# Patient Record
Sex: Female | Born: 1990 | Race: Black or African American | Hispanic: No | Marital: Married | Smoking: Never smoker
Health system: Southern US, Community
[De-identification: ages and names within clinical notes are randomized; demographics above are authoritative.]

## PROBLEM LIST (undated history)

## (undated) ENCOUNTER — Ambulatory Visit: Disposition: A | Discharge status: Home / Self Care | Payer: BC Managed Care – PPO

## (undated) ENCOUNTER — Inpatient Hospital Stay (HOSPITAL_COMMUNITY): Payer: Self-pay

## (undated) DIAGNOSIS — J4520 Mild intermittent asthma, uncomplicated: Secondary | ICD-10-CM

## (undated) DIAGNOSIS — Z87410 Personal history of cervical dysplasia: Secondary | ICD-10-CM

## (undated) DIAGNOSIS — K589 Irritable bowel syndrome without diarrhea: Secondary | ICD-10-CM

## (undated) DIAGNOSIS — Z8742 Personal history of other diseases of the female genital tract: Secondary | ICD-10-CM

## (undated) DIAGNOSIS — G930 Cerebral cysts: Secondary | ICD-10-CM

## (undated) DIAGNOSIS — R569 Unspecified convulsions: Secondary | ICD-10-CM

## (undated) DIAGNOSIS — J309 Allergic rhinitis, unspecified: Secondary | ICD-10-CM

## (undated) DIAGNOSIS — J310 Chronic rhinitis: Secondary | ICD-10-CM

## (undated) DIAGNOSIS — Q046 Congenital cerebral cysts: Secondary | ICD-10-CM

## (undated) DIAGNOSIS — K581 Irritable bowel syndrome with constipation: Secondary | ICD-10-CM

## (undated) DIAGNOSIS — N83209 Unspecified ovarian cyst, unspecified side: Secondary | ICD-10-CM

## (undated) DIAGNOSIS — K219 Gastro-esophageal reflux disease without esophagitis: Secondary | ICD-10-CM

## (undated) DIAGNOSIS — R87619 Unspecified abnormal cytological findings in specimens from cervix uteri: Secondary | ICD-10-CM

## (undated) DIAGNOSIS — R51 Headache: Secondary | ICD-10-CM

## (undated) DIAGNOSIS — G43909 Migraine, unspecified, not intractable, without status migrainosus: Secondary | ICD-10-CM

## (undated) HISTORY — DX: Unspecified abnormal cytological findings in specimens from cervix uteri: R87.619

## (undated) HISTORY — DX: Unspecified convulsions: R56.9

## (undated) HISTORY — DX: Headache: R51

## (undated) HISTORY — DX: Allergic rhinitis, unspecified: J30.9

## (undated) HISTORY — DX: Cerebral cysts: G93.0

## (undated) HISTORY — DX: Mild intermittent asthma, uncomplicated: J45.20

---
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
## 2009-03-16 ENCOUNTER — Other Ambulatory Visit: Admission: RE | Admit: 2009-03-16 | Discharge: 2009-03-16 | Payer: Self-pay | Admitting: Family Medicine

## 2009-05-25 ENCOUNTER — Emergency Department (HOSPITAL_COMMUNITY): Admission: EM | Admit: 2009-05-25 | Discharge: 2009-05-25 | Payer: Self-pay | Admitting: Emergency Medicine

## 2011-01-02 LAB — STREP A DNA PROBE: Group A Strep Probe: NEGATIVE

## 2011-01-02 LAB — MONONUCLEOSIS SCREEN: Mono Screen: NEGATIVE

## 2011-04-02 ENCOUNTER — Encounter (HOSPITAL_COMMUNITY): Payer: Self-pay | Admitting: *Deleted

## 2011-04-02 ENCOUNTER — Emergency Department (HOSPITAL_COMMUNITY)
Admission: EM | Admit: 2011-04-02 | Discharge: 2011-04-02 | Disposition: A | Discharge status: Home / Self Care | Payer: BC Managed Care – PPO | Source: Home / Self Care | Attending: Family Medicine | Admitting: Family Medicine

## 2011-04-02 DIAGNOSIS — J069 Acute upper respiratory infection, unspecified: Secondary | ICD-10-CM

## 2011-04-02 MED ORDER — IBUPROFEN 600 MG PO TABS: 600.0000 mg | ORAL_TABLET | Freq: Three times a day (TID) | ORAL | Status: AC | PRN

## 2011-04-02 MED ORDER — BENZONATATE 100 MG PO CAPS: 100.0000 mg | ORAL_CAPSULE | Freq: Three times a day (TID) | ORAL | Status: AC

## 2011-04-02 MED ORDER — ALBUTEROL SULFATE HFA 108 (90 BASE) MCG/ACT IN AERS: 2.0000 | INHALATION_SPRAY | Freq: Four times a day (QID) | RESPIRATORY_TRACT | Status: DC | PRN

## 2011-04-02 MED ORDER — HYDROCODONE-ACETAMINOPHEN 7.5-500 MG/15ML PO SOLN: 15.0000 mL | Freq: Three times a day (TID) | ORAL | Status: AC | PRN

## 2011-04-02 NOTE — ED Notes (Signed)
Co wheezing with chest tightness and fever with ha x 2 days, prednisone and zpak given at school on Friday, feels like she is not improving

## 2011-04-04 NOTE — ED Provider Notes (Signed)
History     CSN: [id number]  Arrival date & time 04/02/11  1850   First MD Initiated Contact with Patient 04/02/11 1923      Chief Complaint  Patient presents with  . Asthma    (Consider location/radiation/quality/duration/timing/severity/associated sxs/prior treatment) HPI Comments: 21 y/o female h/o asthma here with mother concerned about persistent cough and fever. Was seen in school 2 days ago had a prescription for azithromycin and started prednisone state she is taking medications with no difficulties. No wheezing today has not had any medication for fever today, afebrile here. Mother concerned as patient still coughing and not eating much. Patient reports feeling better and tolerating fluids well with no vomiting or diarrhea. Needs albuterol refill.   Past Medical History  Diagnosis Date  . Asthma     History reviewed. No pertinent past surgical history.  History reviewed. No pertinent family history.  History  Substance Use Topics  . Smoking status: Never Smoker   . Smokeless tobacco: Not on file  . Alcohol Use: Yes    OB History    Grav Para Term Preterm Abortions TAB SAB Ect Mult Living                  Review of Systems  Constitutional: Positive for fever and appetite change.  HENT: Positive for congestion and rhinorrhea. Negative for sore throat.   Respiratory: Negative for shortness of breath and wheezing.   Musculoskeletal: Positive for myalgias and arthralgias.    Allergies  Review of patient's allergies indicates no known allergies.  Home Medications   Current Outpatient Rx  Name Route Sig Dispense Refill  . ACETAMINOPHEN 325 MG PO TABS Oral Take 650 mg by mouth every 6 (six) hours as needed.    . AZITHROMYCIN 1 G PO PACK Oral Take 1 packet by mouth once.    Marland Kitchen PREDNISONE 20 MG PO TABS Oral Take 20 mg by mouth 2 (two) times daily.    . ALBUTEROL SULFATE HFA 108 (90 BASE) MCG/ACT IN AERS Inhalation Inhale 2 puffs into the lungs every 6 (six)  hours as needed for wheezing or shortness of breath. 1 Inhaler 0  . BENZONATATE 100 MG PO CAPS Oral Take 1 capsule (100 mg total) by mouth every 8 (eight) hours. 21 capsule 0  . HYDROCODONE-ACETAMINOPHEN 7.5-500 MG/15ML PO SOLN Oral Take 15 mLs by mouth every 8 (eight) hours as needed for pain or cough. 120 mL 0  . IBUPROFEN 600 MG PO TABS Oral Take 1 tablet (600 mg total) by mouth every 8 (eight) hours as needed for pain or fever. 20 tablet 0    BP 110/69  Pulse 64  Temp(Src) 97.9 F (36.6 C) (Oral)  Resp 17  SpO2 96%  LMP 03/19/2011  Physical Exam  Nursing note and vitals reviewed. Constitutional: She is oriented to person, place, and time. She appears well-developed and well-nourished.  HENT:  Head: Normocephalic and atraumatic.  Right Ear: External ear normal.  Left Ear: External ear normal.       Nasal Congestion with erythema and swelling of nasal turbinates, clear rhinorrhea. Mild pharyngeal erythema no exudates.  TM's normal.  Eyes: Conjunctivae and EOM are normal. Pupils are equal, round, and reactive to light. Right eye exhibits discharge. Left eye exhibits no discharge.  Neck: Normal range of motion. Neck supple.  Cardiovascular: Normal rate, regular rhythm and normal heart sounds.   Pulmonary/Chest: Effort normal and breath sounds normal. No respiratory distress. She has no wheezes. She has no  rales. She exhibits no tenderness.  Lymphadenopathy:    She has no cervical adenopathy.  Neurological: She is alert and oriented to person, place, and time.  Skin: No rash noted.    ED Course  Procedures (including critical care time)  Labs Reviewed - No data to display No results found.   1. URI (upper respiratory infection)       MDM  Likely resolving influenza causing asthma exacerbation. Asked to complete prednisone. Symptomatic cough treatment. Return if recurrent fever or worsening symptoms.         Sharin Grave, MD 04/04/11 1217

## 2011-05-09 DIAGNOSIS — J309 Allergic rhinitis, unspecified: Secondary | ICD-10-CM | POA: Insufficient documentation

## 2012-02-13 ENCOUNTER — Encounter (HOSPITAL_COMMUNITY): Payer: Self-pay | Admitting: *Deleted

## 2012-02-13 ENCOUNTER — Emergency Department (HOSPITAL_COMMUNITY)
Admission: EM | Admit: 2012-02-13 | Discharge: 2012-02-14 | Disposition: A | Discharge status: Home / Self Care | Payer: BC Managed Care – PPO | Attending: Emergency Medicine | Admitting: Emergency Medicine

## 2012-02-13 DIAGNOSIS — O9989 Other specified diseases and conditions complicating pregnancy, childbirth and the puerperium: Secondary | ICD-10-CM | POA: Insufficient documentation

## 2012-02-13 DIAGNOSIS — J45909 Unspecified asthma, uncomplicated: Secondary | ICD-10-CM | POA: Insufficient documentation

## 2012-02-13 DIAGNOSIS — N730 Acute parametritis and pelvic cellulitis: Secondary | ICD-10-CM

## 2012-02-13 DIAGNOSIS — Z79899 Other long term (current) drug therapy: Secondary | ICD-10-CM | POA: Insufficient documentation

## 2012-02-13 DIAGNOSIS — N390 Urinary tract infection, site not specified: Secondary | ICD-10-CM | POA: Insufficient documentation

## 2012-02-13 DIAGNOSIS — N739 Female pelvic inflammatory disease, unspecified: Secondary | ICD-10-CM | POA: Insufficient documentation

## 2012-02-13 DIAGNOSIS — N898 Other specified noninflammatory disorders of vagina: Secondary | ICD-10-CM | POA: Insufficient documentation

## 2012-02-13 LAB — CBC WITH DIFFERENTIAL/PLATELET
Basophils Absolute: 0 10*3/uL (ref 0.0–0.1)
Eosinophils Absolute: 0.1 10*3/uL (ref 0.0–0.7)
HCT: 35.7 % — ABNORMAL LOW (ref 36.0–46.0)
Hemoglobin: 12 g/dL (ref 12.0–15.0)
MCH: 30.1 pg (ref 26.0–34.0)
MCHC: 33.6 g/dL (ref 30.0–36.0)
MCV: 89.5 fL (ref 78.0–100.0)
Monocytes Absolute: 0.3 10*3/uL (ref 0.1–1.0)
RBC: 3.99 MIL/uL (ref 3.87–5.11)
WBC: 4.7 10*3/uL (ref 4.0–10.5)

## 2012-02-13 LAB — COMPREHENSIVE METABOLIC PANEL
ALT: 22 U/L (ref 0–35)
AST: 24 U/L (ref 0–37)
Albumin: 3.6 g/dL (ref 3.5–5.2)
Alkaline Phosphatase: 61 U/L (ref 39–117)
BUN: 8 mg/dL (ref 6–23)
CO2: 24 mEq/L (ref 19–32)
Calcium: 9.1 mg/dL (ref 8.4–10.5)
Chloride: 99 mEq/L (ref 96–112)
Creatinine, Ser: 0.79 mg/dL (ref 0.50–1.10)
GFR calc Af Amer: >90 mL/min (ref >90)
GFR calc non Af Amer: >90 mL/min (ref >90)
Glucose, Bld: 113 mg/dL — ABNORMAL HIGH (ref 70–99)
Potassium: 3 mEq/L — ABNORMAL LOW (ref 3.5–5.1)
Sodium: 133 mEq/L — ABNORMAL LOW (ref 135–145)
Total Bilirubin: 0.5 mg/dL (ref 0.3–1.2)
Total Protein: 7 g/dL (ref 6.0–8.3)

## 2012-02-13 LAB — WET PREP, GENITAL
Clue Cells Wet Prep HPF POC: NONE SEEN
Trich, Wet Prep: NONE SEEN
Yeast Wet Prep HPF POC: NONE SEEN

## 2012-02-13 LAB — URINALYSIS, ROUTINE W REFLEX MICROSCOPIC
Bilirubin Urine: NEGATIVE
Glucose, UA: NEGATIVE mg/dL
Hgb urine dipstick: NEGATIVE
Ketones, ur: 40 mg/dL — AB
Nitrite: NEGATIVE
Protein, ur: 30 mg/dL — AB
Specific Gravity, Urine: 1.036 — ABNORMAL HIGH (ref 1.005–1.030)
Urobilinogen, UA: 1 mg/dL (ref 0.0–1.0)
pH: 7 (ref 5.0–8.0)

## 2012-02-13 LAB — URINE MICROSCOPIC-ADD ON

## 2012-02-13 MED ORDER — AZITHROMYCIN 500 MG PO TABS: 1000.0000 mg | ORAL_TABLET | Freq: Once | ORAL | Status: DC

## 2012-02-13 MED ORDER — CEPHALEXIN 250 MG PO CAPS
500.0000 mg | ORAL_CAPSULE | Freq: Once | ORAL | Status: AC
  Administered 2012-02-14: 500 mg via ORAL
  Filled 2012-02-13: qty 2

## 2012-02-13 MED ORDER — AZITHROMYCIN 1 G PO PACK
1.0000 g | PACK | Freq: Once | ORAL | Status: AC
  Administered 2012-02-14: 1 g via ORAL
  Filled 2012-02-13 (×2): qty 1

## 2012-02-13 MED ORDER — MORPHINE SULFATE 4 MG/ML IJ SOLN
4.0000 mg | Freq: Once | INTRAMUSCULAR | Status: AC
  Administered 2012-02-13: 4 mg via INTRAVENOUS
  Filled 2012-02-13: qty 1

## 2012-02-13 MED ORDER — CEPHALEXIN 500 MG PO CAPS: 500.0000 mg | ORAL_CAPSULE | Freq: Two times a day (BID) | ORAL | Status: AC

## 2012-02-13 MED ORDER — CEFTRIAXONE SODIUM 250 MG IJ SOLR
250.0000 mg | Freq: Once | INTRAMUSCULAR | Status: AC
  Administered 2012-02-14: 250 mg via INTRAMUSCULAR
  Filled 2012-02-13: qty 250

## 2012-02-13 MED ORDER — SODIUM CHLORIDE 0.9 % IV BOLUS (SEPSIS)
1000.0000 mL | Freq: Once | INTRAVENOUS | Status: AC
  Administered 2012-02-13: 1000 mL via INTRAVENOUS

## 2012-02-13 MED ORDER — LIDOCAINE HCL (PF) 1 % IJ SOLN
INTRAMUSCULAR | Status: AC
  Administered 2012-02-14: 1 mL
  Filled 2012-02-13: qty 5

## 2012-02-13 MED ORDER — ONDANSETRON HCL 4 MG/2ML IJ SOLN
4.0000 mg | Freq: Once | INTRAMUSCULAR | Status: AC
  Administered 2012-02-13: 4 mg via INTRAVENOUS
  Filled 2012-02-13: qty 2

## 2012-02-13 NOTE — ED Notes (Signed)
Pt c/o upper abd burning sensation when she sits up along with lower abd pain, worse of left side/ovary X 2.5 weeks. Pt reports she has a cyst on her left ovary. Pain has gotten worse today. Pt reports she felt like she peed on herself today but it was just some clear/yellow vaginal d/c, pt reports small amt, maybe would fill a light pad. Pt reports she is [redacted] weeks pregnant and has gone to see her ob/gyn once.

## 2012-02-13 NOTE — ED Notes (Signed)
The pt has had lower abd pain for one week.  Nauseated but she is 8 weeks preg.  lmp oct 15.  No vaginal bleeding but she has had a vaginal discharge.

## 2012-02-13 NOTE — ED Provider Notes (Signed)
History     CSN: [id number]  Arrival date & time 02/13/12  2118   First MD Initiated Contact with Patient 02/13/12 2140      Chief Complaint  Patient presents with  . Abdominal Pain    (Consider location/radiation/quality/duration/timing/severity/associated sxs/prior treatment) Patient is a 21 y.o. female presenting with abdominal pain. The history is provided by the patient.  Abdominal Pain The primary symptoms of the illness include abdominal pain and vaginal discharge. The primary symptoms of the illness do not include fever, shortness of breath, nausea, vomiting or diarrhea. The current episode started 13 to 24 hours ago. The onset of the illness was gradual. The problem has been gradually worsening.  The abdominal pain began 13 to24 hours ago. The pain came on gradually. The abdominal pain has been gradually worsening since its onset. The abdominal pain is located in the periumbilical region, suprapubic region, RLQ and LLQ. The abdominal pain does not radiate. The abdominal pain is relieved by nothing.  Symptoms associated with the illness do not include chills, diaphoresis, urgency, hematuria, frequency or back pain. Associated medical issues comments: Pregnancy.    Past Medical History  Diagnosis Date  . Asthma     History reviewed. No pertinent past surgical history.  No family history on file.  History  Substance Use Topics  . Smoking status: Never Smoker   . Smokeless tobacco: Not on file  . Alcohol Use: Yes    OB History    Grav Para Term Preterm Abortions TAB SAB Ect Mult Living   1               Review of Systems  Constitutional: Negative for fever, chills and diaphoresis.  Respiratory: Negative for cough and shortness of breath.   Gastrointestinal: Positive for abdominal pain. Negative for nausea, vomiting and diarrhea.  Genitourinary: Positive for vaginal discharge. Negative for urgency, frequency and hematuria.  Musculoskeletal: Negative for back  pain.  All other systems reviewed and are negative.    Allergies  Review of patient's allergies indicates no known allergies.  Home Medications   Current Outpatient Rx  Name  Route  Sig  Dispense  Refill  . ALBUTEROL SULFATE HFA 108 (90 BASE) MCG/ACT IN AERS   Inhalation   Inhale 2 puffs into the lungs every 6 (six) hours as needed for wheezing or shortness of breath.   1 Inhaler   0   . ONDANSETRON HCL 4 MG PO TABS   Oral   Take 4 mg by mouth every 8 (eight) hours as needed. For nausea or vomiting           BP 113/55  Pulse 86  Temp 97.8 F (36.6 C) (Oral)  Resp 18  SpO2 100%  LMP 03/19/2011  Physical Exam  Nursing note and vitals reviewed. Constitutional: She is oriented to person, place, and time. She appears well-developed and well-nourished. No distress.  HENT:  Head: Normocephalic and atraumatic.  Eyes: EOM are normal. Pupils are equal, round, and reactive to light.  Neck: Normal range of motion. Neck supple.  Cardiovascular: Normal rate and regular rhythm.  Exam reveals no friction rub.   No murmur heard. Pulmonary/Chest: Effort normal and breath sounds normal. No respiratory distress. She has no wheezes. She has no rales.  Abdominal: Soft. She exhibits no distension. There is tenderness (lower abdomen). There is no rebound.  Genitourinary: There is no rash, tenderness or lesion on the right labia. There is no rash, tenderness or lesion on the left  labia. Cervix exhibits motion tenderness (Mild). Cervix exhibits no friability. Right adnexum displays tenderness. Right adnexum displays no mass and no fullness. Left adnexum displays tenderness. Left adnexum displays no mass and no fullness. There is erythema (Mild cervical) and tenderness (Diffuse.) around the vagina. No bleeding around the vagina. No foreign body around the vagina. No signs of injury around the vagina. Vaginal discharge found.  Musculoskeletal: Normal range of motion. She exhibits no edema.    Neurological: She is alert and oriented to person, place, and time.  Skin: She is not diaphoretic.    ED Course  Procedures (including critical care time)  Labs Reviewed  CBC WITH DIFFERENTIAL - Abnormal; Notable for the following:    HCT 35.7 (*)     RDW 11.3 (*)     All other components within normal limits  COMPREHENSIVE METABOLIC PANEL - Abnormal; Notable for the following:    Sodium 133 (*)     Potassium 3.0 (*)     Glucose, Bld 113 (*)     All other components within normal limits  URINALYSIS, ROUTINE W REFLEX MICROSCOPIC - Abnormal; Notable for the following:    APPearance CLOUDY (*)     Specific Gravity, Urine 1.036 (*)     Ketones, ur 40 (*)     Protein, ur 30 (*)     Leukocytes, UA MODERATE (*)     All other components within normal limits  PREGNANCY, URINE - Abnormal; Notable for the following:    Preg Test, Ur POSITIVE (*)     All other components within normal limits  URINE MICROSCOPIC-ADD ON - Abnormal; Notable for the following:    Squamous Epithelial / LPF FEW (*)     Bacteria, UA FEW (*)     All other components within normal limits  URINE CULTURE   No results found.   1. PID (acute pelvic inflammatory disease)   2. UTI (lower urinary tract infection)       MDM   79F with hx of 6 week pregnancy presents with abdominal pain. Also states some vaginal discharge. Had prenatal care last week with a transvaginal ultrasound confirming IUP per her primary care doctor's office. I spoke with Dr. Rubin Payor  with her primary care doctor's office who gave the ultrasound report directly to the patient who then told me. Here vitals are stable. Mild lower diffuse abdominal pain. On pelvic exam, patient has purulent discharge in vagina. She has mild cervical erythema. The os is closed. No vaginal bleeding. Clinical exam consistent with PID. I spoke with Dr. Debroah Loop with gynecology who recommended Rocephin with 1 g of azithromycin now and a prescription to repeat the 1 g  of azithromycin in one week. Labs also show UTI, given Keflex first dose here with prescription for same. Instructed to followup with her doctor who provides prenatal care within the next week. Stable for discharge.     Elwin Mocha, MD 02/13/12 [phone]

## 2012-02-14 LAB — GC/CHLAMYDIA PROBE AMP: CT Probe RNA: NEGATIVE

## 2012-02-14 NOTE — ED Notes (Signed)
Pt had more questions and EDP in to see pt.

## 2012-02-14 NOTE — ED Provider Notes (Signed)
I saw and evaluated the patient, reviewed Dr. Tyson Alias note and I agree with the findings and plan.  The patient presents with lower abd pain.  She is pregnant and had an ultrasound performed by her pcp in the past few days.  She reports vaginal discharge but no bleeding or fever.  There are no urinary complaints.    On exam, the patient is afebrile and the vitals are stable.  There heart and lung exam is unremarkable.  The abdomen is ttp in the suprapubic region.  Pelvic exam was performed per Dr. Gwendolyn Grant.  Labs were obtained and revealed evidence for uti and possibly pid.  She will be treated with zithromax and rocephin.  She is non-toxic and is tolerating po.  She appears appropriate for discharge.  She is to follow up with OB and return prn.   Geoffery Lyons, MD 02/14/12 [phone]

## 2012-02-14 NOTE — ED Notes (Signed)
No s/s of adverse reaction from IM abx. Pt instructed on s/s of adverse reaction and need to return to ED if sx occur

## 2012-02-15 LAB — URINE CULTURE

## 2012-02-29 ENCOUNTER — Emergency Department (HOSPITAL_COMMUNITY)
Admission: EM | Admit: 2012-02-29 | Discharge: 2012-03-01 | Disposition: A | Discharge status: Home / Self Care | Payer: BC Managed Care – PPO | Attending: Emergency Medicine | Admitting: Emergency Medicine

## 2012-02-29 ENCOUNTER — Encounter (HOSPITAL_COMMUNITY): Payer: Self-pay | Admitting: Adult Health

## 2012-02-29 DIAGNOSIS — R109 Unspecified abdominal pain: Secondary | ICD-10-CM

## 2012-02-29 DIAGNOSIS — O26899 Other specified pregnancy related conditions, unspecified trimester: Secondary | ICD-10-CM

## 2012-02-29 DIAGNOSIS — O9989 Other specified diseases and conditions complicating pregnancy, childbirth and the puerperium: Secondary | ICD-10-CM | POA: Insufficient documentation

## 2012-02-29 DIAGNOSIS — O219 Vomiting of pregnancy, unspecified: Secondary | ICD-10-CM

## 2012-02-29 DIAGNOSIS — R1084 Generalized abdominal pain: Secondary | ICD-10-CM | POA: Insufficient documentation

## 2012-02-29 DIAGNOSIS — J45909 Unspecified asthma, uncomplicated: Secondary | ICD-10-CM | POA: Insufficient documentation

## 2012-02-29 DIAGNOSIS — K5900 Constipation, unspecified: Secondary | ICD-10-CM | POA: Insufficient documentation

## 2012-02-29 DIAGNOSIS — E860 Dehydration: Secondary | ICD-10-CM

## 2012-02-29 DIAGNOSIS — Z79899 Other long term (current) drug therapy: Secondary | ICD-10-CM | POA: Insufficient documentation

## 2012-02-29 NOTE — ED Notes (Signed)
2 month pregnant presenting with constipation, lower abdominal sharp pains, nausea and inability to eat for 3 weeks, she also reports small amount dark red spotting.

## 2012-03-01 LAB — URINALYSIS, ROUTINE W REFLEX MICROSCOPIC
Glucose, UA: NEGATIVE mg/dL
Hgb urine dipstick: NEGATIVE
Leukocytes, UA: NEGATIVE
pH: 6.5 (ref 5.0–8.0)

## 2012-03-01 LAB — BASIC METABOLIC PANEL
Chloride: 100 mEq/L (ref 96–112)
GFR calc Af Amer: >90 mL/min (ref >90)
GFR calc non Af Amer: >90 mL/min (ref >90)
Potassium: 3.3 mEq/L — ABNORMAL LOW (ref 3.5–5.1)
Sodium: 135 mEq/L (ref 135–145)

## 2012-03-01 LAB — WET PREP, GENITAL
Trich, Wet Prep: NONE SEEN
Yeast Wet Prep HPF POC: NONE SEEN

## 2012-03-01 LAB — CBC
Hemoglobin: 12.6 g/dL (ref 12.0–15.0)
MCHC: 35.1 g/dL (ref 30.0–36.0)
RBC: 4.03 MIL/uL (ref 3.87–5.11)
WBC: 5.7 10*3/uL (ref 4.0–10.5)

## 2012-03-01 LAB — ABO/RH: ABO/RH(D): O POS

## 2012-03-01 MED ORDER — ONDANSETRON 4 MG PO TBDP: 4.0000 mg | ORAL_TABLET | Freq: Three times a day (TID) | ORAL | Status: DC | PRN

## 2012-03-01 MED ORDER — POLYETHYLENE GLYCOL 3350 17 G PO PACK: 17.0000 g | PACK | Freq: Every day | ORAL | Status: DC

## 2012-03-01 MED ORDER — ONDANSETRON 4 MG PO TBDP
8.0000 mg | ORAL_TABLET | Freq: Once | ORAL | Status: AC
  Administered 2012-03-01: 8 mg via ORAL
  Filled 2012-03-01: qty 2

## 2012-03-01 NOTE — ED Provider Notes (Signed)
History     CSN: [id number]  Arrival date & time 02/29/12  2323   First MD Initiated Contact with Patient 02/29/12 2330      Chief Complaint  Patient presents with  . Abdominal Pain    (Consider location/radiation/quality/duration/timing/severity/associated sxs/prior treatment) HPI 21 year old female presents to emergency department complaining of diffuse crampy sharp abdominal pain, nausea and inability to a recurrent well, constipation for the last 3 days. Today she had a small amount of dark red spotting when she wiped after urination. She denies any urinary symptoms, no fevers no chills. Patient is approximately [redacted] weeks pregnant. She does not know her blood type. She has had an ultrasound for her OB that showed an intrauterine pregnancy. Her OB is in [place], and she is in the process of changing it to a local clinic. Patient was seen in the ER recently and thought to have cervicitis/PID. She was treated, and told to follow up with OB which she has not yet done. She has not had a flu shot this year. Patient's mother reports that she has an ongoing problem with constipation, at one point they suggested surgery to correct it. She is requesting an ultrasound so that she can make sure that she is healthy. She reports she has a known ovarian cyst on the left, which "they're not doing anything about". Patient has followup with her OB on the 19th.  Past Medical History  Diagnosis Date  . Asthma     History reviewed. No pertinent past surgical history.  History reviewed. No pertinent family history.  History  Substance Use Topics  . Smoking status: Never Smoker   . Smokeless tobacco: Not on file  . Alcohol Use: Yes    OB History    Grav Para Term Preterm Abortions TAB SAB Ect Mult Living   1               Review of Systems  See History of Present Illness; otherwise all other systems are reviewed and negative  Allergies  Review of patient's allergies indicates no known  allergies.  Home Medications   Current Outpatient Rx  Name  Route  Sig  Dispense  Refill  . ALBUTEROL SULFATE HFA 108 (90 BASE) MCG/ACT IN AERS   Inhalation   Inhale 2 puffs into the lungs every 6 (six) hours as needed for wheezing or shortness of breath.   1 Inhaler   0   . PRENATAL MULTIVITAMIN CH   Oral   Take 1 tablet by mouth daily.         Marland Kitchen ONDANSETRON 4 MG PO TBDP   Oral   Take 1 tablet (4 mg total) by mouth every 8 (eight) hours as needed for nausea (You may take 1-2 tab every 8 hours for nausea).   20 tablet   0   . POLYETHYLENE GLYCOL 3350 PO PACK   Oral   Take 17 g by mouth daily.   14 each   0     BP 107/55  Pulse 62  Temp 97.1 F (36.2 C) (Oral)  Resp 16  SpO2 99%  LMP 03/19/2011  Physical Exam  Nursing note and vitals reviewed. Constitutional: She is oriented to person, place, and time. She appears well-developed and well-nourished. No distress.  HENT:  Head: Normocephalic and atraumatic.  Nose: Nose normal.  Mouth/Throat: Oropharynx is clear and moist.  Eyes: Conjunctivae normal and EOM are normal. Pupils are equal, round, and reactive to light.  Neck: Normal range of  motion. Neck supple. No JVD present. No tracheal deviation present. No thyromegaly present.  Cardiovascular: Normal rate, regular rhythm, normal heart sounds and intact distal pulses.  Exam reveals no gallop and no friction rub.   No murmur heard. Pulmonary/Chest: Effort normal and breath sounds normal. No stridor. No respiratory distress. She has no wheezes. She has no rales. She exhibits no tenderness.  Abdominal: Soft. Bowel sounds are normal. She exhibits no distension and no mass. There is tenderness (patient with diffuse tenderness throughout her abdomen). There is no rebound and no guarding.  Genitourinary:       External genitalia normal Vagina without discharge Cervix closed no lesions No cervical motion tenderness Adnexa palpated, no masses or tenderness noted on the  right, tenderness on left Bladder palpated no tenderness Uterus palpated gravid, mild tenderness    Musculoskeletal: Normal range of motion. She exhibits no edema and no tenderness.  Lymphadenopathy:    She has no cervical adenopathy.  Neurological: She is alert and oriented to person, place, and time. No cranial nerve deficit. She exhibits normal muscle tone. Coordination normal.  Skin: Skin is warm and dry. No rash noted. No erythema. No pallor.  Psychiatric: She has a normal mood and affect. Her behavior is normal. Judgment and thought content normal.    ED Course  Procedures (including critical care time)  Labs Reviewed  URINALYSIS, ROUTINE W REFLEX MICROSCOPIC - Abnormal; Notable for the following:    Specific Gravity, Urine 1.033 (*)     Ketones, ur >80 (*)     All other components within normal limits  BASIC METABOLIC PANEL - Abnormal; Notable for the following:    Potassium 3.3 (*)     All other components within normal limits  CBC - Abnormal; Notable for the following:    HCT 35.9 (*)     RDW 11.3 (*)     All other components within normal limits  WET PREP, GENITAL - Abnormal; Notable for the following:    Clue Cells Wet Prep HPF POC MANY (*)     WBC, Wet Prep HPF POC RARE (*)     All other components within normal limits  ABO/RH  GC/CHLAMYDIA PROBE AMP   No results found.   1. Abdominal pain in pregnancy   2. Constipation   3. Nausea and vomiting in pregnancy   4. Dehydration       MDM  21 year old female with diffuse abdominal pain, and report of an episode of some dark blood when wiping. Bedside ultrasound briefly showed an intrauterine pregnancy with fetal heart rate in the 130s. I spent a good deal time counseling the patient on need for good OB followup, eating small frequent meals and taking anti-emetics as needed. She does not clinically appear dehydrated but does have ketones in her urine. She is tolerating fluids here. Her blood type is O. positive  and she does not require RhoGAM. She has been given precautions for return and encouraged to followup with her OB. Patient instructed to drink more fluids, add more fiber to her diet and use MiraLAX.        Olivia Mackie, MD 03/01/12 [phone]

## 2012-03-02 LAB — GC/CHLAMYDIA PROBE AMP: CT Probe RNA: NEGATIVE

## 2012-06-15 ENCOUNTER — Emergency Department (HOSPITAL_COMMUNITY): Payer: BC Managed Care – PPO

## 2012-06-15 ENCOUNTER — Encounter (HOSPITAL_COMMUNITY): Payer: Self-pay | Admitting: *Deleted

## 2012-06-15 ENCOUNTER — Emergency Department (HOSPITAL_COMMUNITY)
Admission: EM | Admit: 2012-06-15 | Discharge: 2012-06-15 | Disposition: A | Discharge status: Home / Self Care | Payer: BC Managed Care – PPO | Attending: Emergency Medicine | Admitting: Emergency Medicine

## 2012-06-15 DIAGNOSIS — Z79899 Other long term (current) drug therapy: Secondary | ICD-10-CM | POA: Insufficient documentation

## 2012-06-15 DIAGNOSIS — K819 Cholecystitis, unspecified: Secondary | ICD-10-CM

## 2012-06-15 DIAGNOSIS — Z3202 Encounter for pregnancy test, result negative: Secondary | ICD-10-CM | POA: Insufficient documentation

## 2012-06-15 DIAGNOSIS — R109 Unspecified abdominal pain: Secondary | ICD-10-CM | POA: Insufficient documentation

## 2012-06-15 DIAGNOSIS — R5383 Other fatigue: Secondary | ICD-10-CM | POA: Insufficient documentation

## 2012-06-15 DIAGNOSIS — R5381 Other malaise: Secondary | ICD-10-CM | POA: Insufficient documentation

## 2012-06-15 DIAGNOSIS — J45909 Unspecified asthma, uncomplicated: Secondary | ICD-10-CM | POA: Insufficient documentation

## 2012-06-15 DIAGNOSIS — R197 Diarrhea, unspecified: Secondary | ICD-10-CM | POA: Insufficient documentation

## 2012-06-15 DIAGNOSIS — R112 Nausea with vomiting, unspecified: Secondary | ICD-10-CM | POA: Insufficient documentation

## 2012-06-15 LAB — URINALYSIS, MICROSCOPIC ONLY
Nitrite: NEGATIVE
Specific Gravity, Urine: 1.018 (ref 1.005–1.030)
pH: 7.5 (ref 5.0–8.0)

## 2012-06-15 LAB — COMPREHENSIVE METABOLIC PANEL
Albumin: 3.8 g/dL (ref 3.5–5.2)
BUN: 8 mg/dL (ref 6–23)
Creatinine, Ser: 0.76 mg/dL (ref 0.50–1.10)
Potassium: 2.8 mEq/L — ABNORMAL LOW (ref 3.5–5.1)
Total Protein: 7.1 g/dL (ref 6.0–8.3)

## 2012-06-15 LAB — CBC WITH DIFFERENTIAL/PLATELET
Basophils Relative: 0 % (ref 0–1)
Eosinophils Absolute: 0.2 10*3/uL (ref 0.0–0.7)
Hemoglobin: 13.4 g/dL (ref 12.0–15.0)
MCH: 30.9 pg (ref 26.0–34.0)
MCHC: 35 g/dL (ref 30.0–36.0)
Monocytes Relative: 7 % (ref 3–12)
Neutrophils Relative %: 70 % (ref 43–77)
RDW: 12.4 % (ref 11.5–15.5)

## 2012-06-15 LAB — LIPASE, BLOOD: Lipase: 25 U/L (ref 11–59)

## 2012-06-15 MED ORDER — HYDROMORPHONE HCL PF 1 MG/ML IJ SOLN
1.0000 mg | Freq: Once | INTRAMUSCULAR | Status: AC
  Administered 2012-06-15: 1 mg via INTRAVENOUS
  Filled 2012-06-15: qty 1

## 2012-06-15 MED ORDER — PROMETHAZINE HCL 25 MG PO TABS: 25.0000 mg | ORAL_TABLET | Freq: Four times a day (QID) | ORAL | Status: DC | PRN

## 2012-06-15 MED ORDER — OXYCODONE-ACETAMINOPHEN 5-325 MG PO TABS: 2.0000 | ORAL_TABLET | ORAL | Status: DC | PRN

## 2012-06-15 MED ORDER — SODIUM CHLORIDE 0.9 % IV BOLUS (SEPSIS)
1000.0000 mL | Freq: Once | INTRAVENOUS | Status: AC
Start: 2012-06-15 — End: 2012-06-15
  Administered 2012-06-15: 1000 mL via INTRAVENOUS

## 2012-06-15 MED ORDER — SODIUM CHLORIDE 0.9 % IV BOLUS (SEPSIS)
1000.0000 mL | Freq: Once | INTRAVENOUS | Status: AC
  Administered 2012-06-15: 1000 mL via INTRAVENOUS

## 2012-06-15 MED ORDER — ONDANSETRON HCL 4 MG/2ML IJ SOLN
4.0000 mg | Freq: Once | INTRAMUSCULAR | Status: AC
  Administered 2012-06-15: 4 mg via INTRAVENOUS
  Filled 2012-06-15: qty 2

## 2012-06-15 NOTE — ED Provider Notes (Signed)
History     CSN: [id number]  Arrival date & time 06/15/12  1339   First MD Initiated Contact with Patient 06/15/12 1452      Chief Complaint  Patient presents with  . Abdominal Pain  . Emesis  . Diarrhea    (Consider location/radiation/quality/duration/timing/severity/associated sxs/prior treatment) HPI Comments: Patient comes to the ER for evaluation of nausea, vomiting and diarrhea. Symptoms began early this morning. Patient has had recurrent vomiting and has not been able to hold anything down. Patient reports severe upper abdominal pain, cramping. Discomfort is sharp in nature. It is associated with vomiting. Patient has reportedly been sick for 2 weeks for upper respiratory infection symptoms, but the GI symptoms began today.  Patient is a 22 y.o. female presenting with abdominal pain, vomiting, and diarrhea.  Abdominal Pain Associated symptoms: diarrhea and vomiting   Associated symptoms: no chest pain and no fever   Emesis Associated symptoms: abdominal pain and diarrhea   Diarrhea Associated symptoms: abdominal pain and vomiting   Associated symptoms: no fever     Past Medical History  Diagnosis Date  . Asthma     History reviewed. No pertinent past surgical history.  History reviewed. No pertinent family history.  History  Substance Use Topics  . Smoking status: Never Smoker   . Smokeless tobacco: Not on file  . Alcohol Use: Yes    OB History   Grav Para Term Preterm Abortions TAB SAB Ect Mult Living   1               Review of Systems  Constitutional: Negative for fever.  Cardiovascular: Negative for chest pain.  Gastrointestinal: Positive for vomiting, abdominal pain and diarrhea.  Neurological: Positive for weakness.  All other systems reviewed and are negative.    Allergies  Review of patient's allergies indicates no known allergies.  Home Medications   Current Outpatient Rx  Name  Route  Sig  Dispense  Refill  . PRESCRIPTION  MEDICATION   Oral   Take 1 tablet by mouth every 6 (six) hours as needed (pain).         Marland Kitchen albuterol (PROVENTIL HFA;VENTOLIN HFA) 108 (90 BASE) MCG/ACT inhaler   Inhalation   Inhale 2 puffs into the lungs every 6 (six) hours as needed for wheezing or shortness of breath.   1 Inhaler   0     BP 120/40  Pulse 81  Resp 22  SpO2 100%  LMP 06/15/2012  Breastfeeding? Unknown  Physical Exam  Constitutional: She is oriented to person, place, and time. She appears well-developed and well-nourished. She appears distressed.  HENT:  Head: Normocephalic and atraumatic.  Right Ear: Hearing normal.  Nose: Nose normal.  Mouth/Throat: Oropharynx is clear and moist and mucous membranes are normal.  Eyes: Conjunctivae and EOM are normal. Pupils are equal, round, and reactive to light.  Neck: Normal range of motion. Neck supple.  Cardiovascular: Normal rate, regular rhythm, S1 normal and S2 normal.  Exam reveals no gallop and no friction rub.   No murmur heard. Pulmonary/Chest: Effort normal and breath sounds normal. No respiratory distress. She exhibits no tenderness.  Abdominal: Soft. Normal appearance and bowel sounds are normal. There is no hepatosplenomegaly. There is tenderness in the right upper quadrant, epigastric area and left upper quadrant. There is no rebound, no guarding, no tenderness at McBurney's point and negative Murphy's sign. No hernia.  Epigastric and left upper quadrant more tender than right upper quadrant. Negative Murphy's sign.  Musculoskeletal: Normal  range of motion.  Neurological: She is alert and oriented to person, place, and time. She has normal strength. No cranial nerve deficit or sensory deficit. Coordination normal. GCS eye subscore is 4. GCS verbal subscore is 5. GCS motor subscore is 6.  Skin: Skin is warm, dry and intact. No rash noted. No cyanosis.  Psychiatric: She has a normal mood and affect. Her speech is normal and behavior is normal. Thought content  normal.    ED Course  Procedures (including critical care time)  Labs Reviewed  CBC WITH DIFFERENTIAL - Abnormal; Notable for the following:    WBC 10.7 (*)    All other components within normal limits  COMPREHENSIVE METABOLIC PANEL - Abnormal; Notable for the following:    Potassium 2.8 (*)    CO2 18 (*)    Glucose, Bld 150 (*)    AST 54 (*)    ALT 42 (*)    All other components within normal limits  GLUCOSE, CAPILLARY - Abnormal; Notable for the following:    Glucose-Capillary 129 (*)    All other components within normal limits  LIPASE, BLOOD  URINALYSIS, MICROSCOPIC ONLY   US Abdomen Complete  06/15/2012  *RADIOLOGY REPORT*  Clinical Data:  Upper abdominal pain.  COMPLETE ABDOMINAL ULTRASOUND  Comparison:  Two-view abdomen 06/19/2006.  Findings:  Gallbladder:  The gallbladder wall is thickened and appears somewhat edematous. It measures 5 mm.  There is no sonographic Murphy's sign.  No stones or obstruction are evident.  Common bile duct:  Normal in caliber. No biliary ductal dilation. The maximal diameter is at the upper limits of normal, 6 mm.  Liver:  No focal lesion identified.  Within normal limits in parenchymal echogenicity.  IVC:  Appears normal.  Pancreas:  Although the pancreas is difficult to visualize in its entirety, no focal pancreatic abnormality is identified.  Spleen:  Normal size and echotexture without focal parenchymal abnormality.  The maximal length is 9.1 cm, within normal limits.  Right Kidney:  No hydronephrosis.  Well-preserved cortex.  Normal size and parenchymal echotexture without focal abnormalities. The maximal length is 11.3 cm.  Left Kidney:  No hydronephrosis.  Well-preserved cortex.  Normal size and parenchymal echotexture without focal abnormalities. The maximal length is 10.6 cm.  Abdominal aorta:  No aneurysm identified.  IMPRESSION:  1.  Edematous gallbladder wall measuring up to 5 mm.  This may represent early cholecystitis. 2.  No gallbladder  stones or obstruction are evident.   Original Report Authenticated By: Marin Roberts, M.D.      Diagnosis: Abdominal pain, possible cholecystitis    MDM  Patient comes to the ER with complaints of nausea, vomiting, diarrhea and abdominal pain. She was experiencing fairly severe pain and cramping in the upper abdomen. Examination revealed tenderness diffusely across the upper abdomen, but more severe in the epigastric and left upper quadrant and right upper quadrant. Blood work reveals only slightly elevated transaminases. She has minimal leukocytosis as well. Patient underwent gallbladder ultrasound for further evaluation. There is gallbladder wall edema without stones or ductal dilatation. This could be early cholecystitis. Patient has had significant improvement after a single dose of pain medication here in the ER. Repeat examination revealed continued mild tenderness in the left upper quadrant. No right upper quadrant tenderness. Family reports that she has had a lot of GI symptoms in the past. I cannot clearly define the gallbladder as the cause of the patient's symptoms, and in fact examination would seem to suggest it is not.  This was discussed with Dr. Magnus Ivan, on call for surgery. He did confirm that the patient was okay to be discharged and followup in the office for further evaluation.        Gilda Crease, MD 06/15/12 2116

## 2012-06-15 NOTE — ED Notes (Signed)
Reports waking up with chills, abd pain, vomiting and diarrhea.

## 2012-06-15 NOTE — ED Notes (Signed)
MD at bedside. 

## 2012-06-19 ENCOUNTER — Encounter (INDEPENDENT_AMBULATORY_CARE_PROVIDER_SITE_OTHER): Payer: Self-pay | Admitting: General Surgery

## 2012-06-19 ENCOUNTER — Ambulatory Visit (INDEPENDENT_AMBULATORY_CARE_PROVIDER_SITE_OTHER): Payer: BC Managed Care – PPO | Admitting: General Surgery

## 2012-06-19 VITALS — BP 112/66 | HR 72 | Temp 98.2°F | Resp 20 | Ht 66.0 in | Wt 183.0 lb

## 2012-06-19 DIAGNOSIS — K8020 Calculus of gallbladder without cholecystitis without obstruction: Secondary | ICD-10-CM

## 2012-06-19 DIAGNOSIS — K8050 Calculus of bile duct without cholangitis or cholecystitis without obstruction: Secondary | ICD-10-CM

## 2012-06-19 NOTE — Progress Notes (Signed)
Patient ID: Lauren Leonard, female   DOB: 31-Jan-1991, 22 y.o.   MRN: [id number]  Chief Complaint  Patient presents with  . New Evaluation    eval GB    HPI Lauren Leonard is a 22 y.o. female.  The patient is a 22 year old female referred by [facility] for epigastric pain. Patient presented to the ED secondary to epigastric pain with nausea vomiting. The patient underwent ultrasound which revealed thickening of the gallbladder however no stones were seen. The patient states she's continued with pain in her epigastric and right upper quadrant area. She does not associated pain with food however she does state she does notice that she takes high fatty foods prior to the pain.  Patient had a minimally elevated AST and ALT, T. Bili is normal. HPI  Past Medical History  Diagnosis Date  . Asthma   . Allergy     sinus     History reviewed. No pertinent past surgical history.  Family History  Problem Relation Age of Onset  . Cancer Maternal Grandmother     breast  . Diabetes Maternal Grandmother   . Hypertension Maternal Grandmother   . Cancer Maternal Grandfather     panreatic  . Diabetes Maternal Grandfather     Social History History  Substance Use Topics  . Smoking status: Never Smoker   . Smokeless tobacco: Never Used  . Alcohol Use: Yes    No Known Allergies  Current Outpatient Prescriptions  Medication Sig Dispense Refill  . diphenhydrAMINE (BENADRYL) 25 MG tablet Take 25 mg by mouth at bedtime.      Marland Kitchen oxyCODONE-acetaminophen (PERCOCET) 5-325 MG per tablet Take 2 tablets by mouth every 4 (four) hours as needed for pain.  15 tablet  0  . albuterol (PROVENTIL HFA;VENTOLIN HFA) 108 (90 BASE) MCG/ACT inhaler Inhale 2 puffs into the lungs every 6 (six) hours as needed for wheezing or shortness of breath.  1 Inhaler  0  . promethazine (PHENERGAN) 25 MG tablet Take 1 tablet (25 mg total) by mouth every 6 (six) hours as needed for nausea.  30 tablet  0   No current  facility-administered medications for this visit.    Review of Systems Review of Systems  Constitutional: Negative.   HENT: Negative.   Respiratory: Negative.   Cardiovascular: Negative.   Gastrointestinal: Negative.   Endocrine: Negative.   Neurological: Negative.     Blood pressure 112/66, pulse 72, temperature 98.2 F (36.8 C), temperature source Temporal, resp. rate 20, height 5\' 6"  (1.676 m), weight 183 lb (83.008 kg), last menstrual period 06/15/2012.  Physical Exam Physical Exam  Constitutional: She is oriented to person, place, and time. She appears well-developed and well-nourished.  HENT:  Head: Normocephalic and atraumatic.  Eyes: Conjunctivae and EOM are normal. Pupils are equal, round, and reactive to light.  Neck: Normal range of motion. Neck supple.  Cardiovascular: Normal rate, regular rhythm and normal heart sounds.   Pulmonary/Chest: Effort normal.  Abdominal: Soft. She exhibits no distension and no mass. There is tenderness (RUQ). There is no rebound and no guarding.  Musculoskeletal: Normal range of motion.  Neurological: She is alert and oriented to person, place, and time.    Data Reviewed Ultrasound reveals thickening of the gallbladder. Minimal elevated AST ALT. Alkaline phosphatase T. Bili are normal.  Assessment    22 year old female with likely biliary colic.     Plan    1. Will proceed to the operative for a laparoscopic cholecystectomy with  no cholangiogram. 2.All risks and benefits were discussed with the patient to generally include: infection, bleeding, possible need for post op ERCP, damage to the bile ducts, and bile leak. Alternatives were offered and described.  All questions were answered and the patient voiced understanding of the procedure and wishes to proceed at this point with a laparoscopic cholecystectomy         Marigene Ehlers., [facility] 06/19/2012, 10:13 AM

## 2012-06-28 ENCOUNTER — Other Ambulatory Visit (INDEPENDENT_AMBULATORY_CARE_PROVIDER_SITE_OTHER): Payer: Self-pay | Admitting: General Surgery

## 2012-06-28 DIAGNOSIS — K811 Chronic cholecystitis: Secondary | ICD-10-CM

## 2012-06-28 HISTORY — PX: CHOLECYSTECTOMY: SHX55

## 2012-06-28 HISTORY — PX: LAPAROSCOPIC CHOLECYSTECTOMY: SUR755

## 2012-07-03 ENCOUNTER — Telehealth (INDEPENDENT_AMBULATORY_CARE_PROVIDER_SITE_OTHER): Payer: Self-pay | Admitting: *Deleted

## 2012-07-03 NOTE — Telephone Encounter (Signed)
Patient called to state she has a sore throat and had a low grade fever with 100F being the highest yesterday.  Instructed patient that the sore throat may be from the ET tube irritating the throat so to drink cold liquids.  Also instructed patient to take Tylenol to help with the fever but reminded her the Percocet contains Tylenol so to make sure she includes that in her dosing.  Patient denies any redness, drainage, or other signs of infection at the incision site.  Patient states that when she takes the Percocet it is causing her to itch so encouraged her to take Benadryl about 30 minutes prior to taking the Percocet if she needs it.  Patient is agreeable to all and states understanding.  Patient asked that Derrell Lolling MD be informed of her symptoms.

## 2012-07-09 ENCOUNTER — Telehealth (INDEPENDENT_AMBULATORY_CARE_PROVIDER_SITE_OTHER): Payer: Self-pay | Admitting: *Deleted

## 2012-07-09 NOTE — Telephone Encounter (Signed)
Patient just walked in wanting her incision site to be assessed.  Patient has a small area of pus at the incision site.  It was suggested to patient to be seen in the urgent office today however patient states she is unable to due to work.  Patient also states she began with a low grade fever last night.  Patient again encouraged to try to come in for an appt for urgent office or she needs to be seen in the ED due to symptoms.  Patient states understanding and will try to speak with her boss about letting her off so she can come in to be seen.  Patient is scheduled for appt with Derrell Lolling MD this Friday.

## 2012-07-10 ENCOUNTER — Encounter (INDEPENDENT_AMBULATORY_CARE_PROVIDER_SITE_OTHER): Payer: Self-pay | Admitting: General Surgery

## 2012-07-10 ENCOUNTER — Ambulatory Visit (INDEPENDENT_AMBULATORY_CARE_PROVIDER_SITE_OTHER): Payer: BC Managed Care – PPO | Admitting: General Surgery

## 2012-07-10 VITALS — BP 112/78 | HR 76 | Temp 97.4°F | Resp 14 | Ht 66.0 in | Wt 182.6 lb

## 2012-07-10 DIAGNOSIS — K8020 Calculus of gallbladder without cholecystitis without obstruction: Secondary | ICD-10-CM

## 2012-07-10 MED ORDER — PROMETHAZINE HCL 12.5 MG PO TABS: 12.5000 mg | ORAL_TABLET | Freq: Four times a day (QID) | ORAL | Status: DC | PRN

## 2012-07-10 NOTE — Patient Instructions (Signed)
Follow up on Friday with your surgeon

## 2012-07-12 ENCOUNTER — Encounter (INDEPENDENT_AMBULATORY_CARE_PROVIDER_SITE_OTHER): Payer: Self-pay | Admitting: General Surgery

## 2012-07-12 ENCOUNTER — Ambulatory Visit (INDEPENDENT_AMBULATORY_CARE_PROVIDER_SITE_OTHER): Payer: BC Managed Care – PPO | Admitting: General Surgery

## 2012-07-12 VITALS — BP 92/64 | HR 69 | Temp 97.6°F | Resp 16 | Ht 66.0 in | Wt 184.8 lb

## 2012-07-12 DIAGNOSIS — Z9889 Other specified postprocedural states: Secondary | ICD-10-CM

## 2012-07-12 DIAGNOSIS — Z9049 Acquired absence of other specified parts of digestive tract: Secondary | ICD-10-CM

## 2012-07-12 NOTE — Progress Notes (Signed)
Patient ID: Lauren Leonard, female   DOB: 1990/09/20, 22 y.o.   MRN: [id number] The patient is a 22 year old female status post laparoscopic cholecystectomy secondary to biliary colic. The patient had an Episode of periumbilical pain and was seen in urgent clinic.  The patient's pain is improved tremendously since that time. She does state she's tried to a low-fat diet however has had some high fat diet needles which creates nausea.  Pathology: Reveals chronic cholecystitis no cholelithiasis. This was discussed with patient.  Physical exam: Her wounds are clean dry and intact, there is no erythema, there is no drainage  Assessment and plan: 22 year old female status post laparoscopic cholecystectomy 1. We discussed weightlifting restrictions for a month. 2. Patient can followup as needed

## 2012-08-01 NOTE — Progress Notes (Signed)
Subjective:     Patient ID: Lauren Leonard, female   DOB: 12/20/1990, 22 y.o.   MRN: [id number]  HPI The patient is a 22 year old female who is 12 days s/p laparoscopic cholecystectomy. She comes in today complaining of abdominal pain near her umbilicus. She has had some nausea associated with it. She denies fever or chills  Review of Systems     Objective:   Physical Exam On exam her abdomen is soft but she is tender near her umbilicus. She does not have peritonitis. Her incisions are healing nicely with no sign of infection    Assessment:     The pt is 12 days s/p lap chole with abdominal pain that is a little more than I would expect at this point but this certainly could be just postoperative pain that will resolve with time. I have offered to get a scan of her abdomen but she would like to wait it out.     Plan:     She has a follow up appt with Dr. Derrell Lolling scheduled for this Friday and if she is not better then she will consider a scan

## 2012-08-07 ENCOUNTER — Telehealth (INDEPENDENT_AMBULATORY_CARE_PROVIDER_SITE_OTHER): Payer: Self-pay

## 2012-08-07 NOTE — Telephone Encounter (Signed)
Pt still having discomfort after surgery in April.  She says she "felt something pop in her abdomen", and her nausea is still bothering her.  She is scheduled to come back to see Dr. Derrell Lolling in 3 weeks, but would like the nurse to call her as well.

## 2012-08-08 NOTE — Telephone Encounter (Signed)
Called and spoke to patient re: epic message I received on her...she stated that when she roller over to stretch one morning on her stomach she felt a pop and her stomach was hurting her for the rest of that day...she also stated that she is still very nauseated after eating bacon, pancakes from two different people and pop tarts...basically her stomach wasn't hurting anymore and I offered her an appt to come in and see AR on 6/6 but she declined because she was going on a cruise.Marland KitchenMarland KitchenPer verbal orders from Dr.Ramirez I called in a refill on phenergan 25 mg #30 no refills to the [facility] [phone] spoke with Tammy and LM notifying patient...told patient to call the office back if she had anymore questions or concerns and that we could get her seen either by AR or one of his partners...she was pleased

## 2012-09-02 ENCOUNTER — Encounter (INDEPENDENT_AMBULATORY_CARE_PROVIDER_SITE_OTHER): Payer: Self-pay | Admitting: General Surgery

## 2012-09-02 ENCOUNTER — Ambulatory Visit (INDEPENDENT_AMBULATORY_CARE_PROVIDER_SITE_OTHER): Payer: BC Managed Care – PPO | Admitting: General Surgery

## 2012-09-02 VITALS — BP 112/76 | HR 89 | Resp 14 | Ht 66.0 in | Wt 185.0 lb

## 2012-09-02 DIAGNOSIS — Z9889 Other specified postprocedural states: Secondary | ICD-10-CM

## 2012-09-02 DIAGNOSIS — Z9049 Acquired absence of other specified parts of digestive tract: Secondary | ICD-10-CM

## 2012-09-02 MED ORDER — METOCLOPRAMIDE HCL 10 MG PO TABS: 10.0000 mg | ORAL_TABLET | Freq: Four times a day (QID) | ORAL | Status: DC

## 2012-09-02 NOTE — Progress Notes (Signed)
Patient ID: Lauren Leonard, female   DOB: 1990-10-26, 22 y.o.   MRN: [id number] The patient is a 22 year old female status post laparoscopic cholecystectomy. Patient had some intermittent nausea. The patient is to take a nausea medication. Discussed with the patient seems like she is not been staying on a low-fat diet. Patient also complains of some periumbilical pain.  The patient noted a popping sensation several weeks ago. Has not had any subsequent episodes. She is not taking any pain medication for this.  Assessment and plan: 22 year old female status post laparoscopic cholecystectomy 1. Give the patient a prescription for Reglan 10 mg every 6 hours. Should the patient experience any diarrhea she got back to every 12 hours. 2. The patient fell back up into 2 weeks

## 2012-09-18 ENCOUNTER — Encounter (INDEPENDENT_AMBULATORY_CARE_PROVIDER_SITE_OTHER): Payer: BC Managed Care – PPO | Admitting: General Surgery

## 2012-09-27 ENCOUNTER — Encounter (INDEPENDENT_AMBULATORY_CARE_PROVIDER_SITE_OTHER): Payer: Self-pay | Admitting: General Surgery

## 2012-11-15 ENCOUNTER — Ambulatory Visit (INDEPENDENT_AMBULATORY_CARE_PROVIDER_SITE_OTHER): Payer: BC Managed Care – PPO | Admitting: Medical

## 2012-11-15 ENCOUNTER — Encounter: Payer: Self-pay | Admitting: Medical

## 2012-11-15 ENCOUNTER — Ambulatory Visit
Admission: RE | Admit: 2012-11-15 | Discharge: 2012-11-15 | Disposition: A | Discharge status: Home / Self Care | Payer: BC Managed Care – PPO | Source: Ambulatory Visit | Attending: Medical | Admitting: Medical

## 2012-11-15 VITALS — BP 98/60 | HR 72 | Temp 98.1°F | Resp 16 | Wt 182.0 lb

## 2012-11-15 DIAGNOSIS — R7611 Nonspecific reaction to tuberculin skin test without active tuberculosis: Secondary | ICD-10-CM

## 2012-11-15 NOTE — Progress Notes (Addendum)
Subjective:  Lauren Leonard is a 22 y.o. female who presents as a new patient accompanied by her mother today.  She works at [employer].  Had PPD Tuesday at her employer's office for screening and was reported to be positive.  Mom brought her here for eval.  Had appt with health dept today but they canceled since she was coming here.  The PPD was placed Monday, and it was read yesterday.  Thus, today is >72 hours.  She notes hx/o several negative PPD tests in the past, last in 2009.  She has no symptoms, she has no other c/o.  Denies any known exposures although there was a patient at the orthopedic office this past year who they thought may have had symptoms of TB.  She denies any other exposures, no international travel, no contact with other high risk groups.  No hx/o BCG vaccine. No other c/o.  The following portions of the patient's history were reviewed and updated as appropriate: allergies, current medications, past family history, past medical history, past social history, past surgical history and problem list.  ROS Otherwise as in subjective above  Objective: Physical Exam  Vital signs reviewed  General appearance: alert, no distress, WD/WN, AA female Lungs: CTA bilaterally, no wheezes, rhonchi, or rales Skin: left arm with 15mm of erythema raised induration, +test   Assessment: Encounter Diagnosis  Name Primary?  . PPD positive Yes    Plan: Will send for CXR.  Will need health dept f/u pending CXR.  Follow up: pending xray.

## 2012-11-15 NOTE — Progress Notes (Signed)
LMOM TO CB. CLS 

## 2012-11-19 ENCOUNTER — Telehealth: Payer: Self-pay | Admitting: Internal Medicine

## 2012-11-19 NOTE — Telephone Encounter (Signed)
Pt is wanting a letter stating that everything is okay for her job to go back and her xray was negative. And they need a copy of the xray

## 2012-11-20 NOTE — Telephone Encounter (Signed)
pls get the health dept note, and see what discussion she and health dept had?

## 2012-11-21 ENCOUNTER — Encounter: Payer: Self-pay | Admitting: Medical

## 2012-11-21 NOTE — Telephone Encounter (Signed)
Pt picked up her note

## 2012-12-10 ENCOUNTER — Ambulatory Visit (INDEPENDENT_AMBULATORY_CARE_PROVIDER_SITE_OTHER): Payer: BC Managed Care – PPO | Admitting: Medical

## 2012-12-10 ENCOUNTER — Encounter: Payer: Self-pay | Admitting: Medical

## 2012-12-10 VITALS — BP 98/60 | HR 78 | Temp 98.0°F | Resp 16 | Wt 186.0 lb

## 2012-12-10 DIAGNOSIS — L988 Other specified disorders of the skin and subcutaneous tissue: Secondary | ICD-10-CM

## 2012-12-12 NOTE — Progress Notes (Signed)
Pt left before she could be seen by me.  Waited 20 minutes.  We called to apologize for her inconvenience. She will reschedule.

## 2012-12-16 ENCOUNTER — Other Ambulatory Visit: Payer: Self-pay | Admitting: Medical

## 2012-12-16 ENCOUNTER — Encounter: Payer: Self-pay | Admitting: Medical

## 2012-12-16 ENCOUNTER — Ambulatory Visit (INDEPENDENT_AMBULATORY_CARE_PROVIDER_SITE_OTHER): Payer: BC Managed Care – PPO | Admitting: Medical

## 2012-12-16 VITALS — BP 102/60 | HR 68 | Temp 97.9°F | Resp 16 | Wt 183.0 lb

## 2012-12-16 DIAGNOSIS — D2372 Other benign neoplasm of skin of left lower limb, including hip: Secondary | ICD-10-CM

## 2012-12-16 DIAGNOSIS — N898 Other specified noninflammatory disorders of vagina: Secondary | ICD-10-CM

## 2012-12-16 DIAGNOSIS — D2370 Other benign neoplasm of skin of unspecified lower limb, including hip: Secondary | ICD-10-CM

## 2012-12-16 LAB — POCT WET PREP (WET MOUNT)

## 2012-12-16 MED ORDER — FLUCONAZOLE 150 MG PO TABS: 150.0000 mg | ORAL_TABLET | Freq: Once | ORAL | Status: DC

## 2012-12-16 NOTE — Progress Notes (Signed)
Subjective: Here for c/o mole on inner upper left thigh x 16mo.  She gets bumps shaving in the genital region, but this bump woundl't go away.  It first appeared about 16mo ago.  She attempted to cut it off with scissors, but just made it bleed and hurt.  She just wants it removed at this point.  Denies hx/o skin cancer.  She notes hx/o vaginal discharge, yellowish, for a week.  Has hx/o BV since middle school, multiple times prior.  No concern for STD.  Has had 2 sexual partners since last STD screening a year ago, but uses condoms every time.  She does note hx/o gonorrhea at age 22yo, hx/o abnormal pap 16mo ago, has f/u before Christmas with gynecology for recheck on pap.  No recent antibiotic use.    Past Medical History  Diagnosis Date  . Asthma   . Allergy     sinus    ROS as in subjective  Objective: Gen: wd, wn, nad Skin: left upper inner thigh with papular pink/red 3-50mm raised roundish lesion, seems to have small entry hole at top like hair follicle.   Gyn: normal external genitalia, no lesions, no obvious discharge, pink mucosa,  Cervix normal appearing, swabs taken, exam chaperoned by nurse   Assessment: Encounter Diagnoses  Name Primary?  . Vaginal discharge Yes  . 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  Will treat with diflucan.  Discussed safe sex.  Discussed general measures to prevent UTI or yeast infection or BV.  Discussed hygiene, hydration, avoidance of sugary foods, discussed urination after intercourse, consistent soap use, not change hygiene products frequently, avoiding douching.  Neoplasm of skin - discussed her skin finding and possible etiologies although it could just be an inflamed hair follicle. I recommended against excision at this time, however she would like this lesion removed.  Prepped and draped in usual sterile fashion, used 2 cc of 1% lidocaine with epinephrine for local anesthesia,  used a shave technique trimming the lesion, used electrocautery to achieve hemostasis.   Covered with sterile dressing, discussed wound care.  Lesion sent for pathology

## 2012-12-17 ENCOUNTER — Telehealth: Payer: Self-pay | Admitting: Internal Medicine

## 2012-12-17 ENCOUNTER — Telehealth: Payer: Self-pay | Admitting: Family Medicine

## 2012-12-17 LAB — GC/CHLAMYDIA PROBE AMP: GC Probe RNA: NEGATIVE

## 2012-12-17 NOTE — Telephone Encounter (Signed)
I called and I spoke with the patient. She said that she called because she went through 3 bandaids but as of this morning everything seems to be all right. I told her if she has anymore problems to give [place] a call back. CLS

## 2012-12-17 NOTE — Telephone Encounter (Signed)
Message copied by Janeice Robinson on Tue Dec 17, 2012  8:44 AM ------      Message from: Jac Canavan      Created: Tue Dec 17, 2012  8:17 AM       Call and check on her. She called Dr. Susann Givens last night and apparently was having a little bit of bleeding where we took off the skin lesion.  It should be stopped bleeding by now.  If not have her stop by for a brief visit (no charge) and we'll stop the bleeding.  I cauterized the area and it was not bleeding when she left, but it apparently had some small capillaries bleeding after she left. ------

## 2012-12-17 NOTE — Telephone Encounter (Signed)
Pt called stating that she is bleeding through the gauze and she can see it through the tape. Please call pt on work phone [phone]

## 2012-12-18 NOTE — Telephone Encounter (Signed)
I left a message for [facility] letting her know that she could come by in the morning if the wound hadn't stop bleeding and Lauren Leonard would look at it before she went to work. CLS

## 2012-12-18 NOTE — Patient Instructions (Signed)
Molluscum Contagiosum  Molluscum contagiosum is a viral infection of the skin that causes smooth surfaced, firm, small (3 to 5 mm), dome-shaped bumps (papules) which are flesh-colored. The bumps usually do not hurt or itch. In children, they most often appear on the face, trunk, arms and legs. In adults, the growths are commonly found on the genitals, thighs, face, neck, and belly (abdomen). The infection may be spread to others by close (skin to skin) contact (such as occurs in schools and swimming pools), sharing towels and clothing, and through sexual contact. The bumps usually disappear without treatment in 2 to 4 months, especially in children. You may have them treated to avoid spreading them. Scraping (curetting) the middle part (central plug) of the bump with a needle or sharp curette, or application of liquid nitrogen for 8 or 9 seconds usually cures the infection.  HOME CARE INSTRUCTIONS   · Do not scratch the bumps. This may spread the infection to other parts of the body and to other people.  · Avoid close contact with others, including sexual contact, until the bumps disappear. Do not share towels or clothing.  · If liquid nitrogen was used, blisters will form. Leave the blisters alone and cover with a bandage. The tops will fall off by themselves in 7 to 14 days.  · Four months without a lesion is usually a cure.  SEEK IMMEDIATE MEDICAL CARE IF:  · You have a fever.  · You develop swelling, redness, pain, tenderness, or warmth in the areas of the bumps. They may be infected.  Document Released: 03/03/2000 Document Revised: 05/29/2011 Document Reviewed: 08/14/2008  [employer]® Patient Information ©2014 [employer].

## 2013-01-23 ENCOUNTER — Other Ambulatory Visit: Payer: Self-pay

## 2013-04-22 ENCOUNTER — Ambulatory Visit (INDEPENDENT_AMBULATORY_CARE_PROVIDER_SITE_OTHER): Payer: BC Managed Care – PPO | Admitting: Family Medicine

## 2013-04-22 VITALS — BP 116/64 | HR 73 | Temp 98.5°F | Resp 16 | Ht 65.5 in | Wt 189.0 lb

## 2013-04-22 DIAGNOSIS — R51 Headache: Secondary | ICD-10-CM

## 2013-04-22 DIAGNOSIS — R42 Dizziness and giddiness: Secondary | ICD-10-CM

## 2013-04-22 LAB — POCT CBC
Granulocyte percent: 49.3 %G (ref 37–80)
HEMATOCRIT: 44.4 % (ref 37.7–47.9)
HEMOGLOBIN: 13.7 g/dL (ref 12.2–16.2)
Lymph, poc: 2.8 (ref 0.6–3.4)
MCH: 31.1 pg (ref 27–31.2)
MCHC: 30.9 g/dL — AB (ref 31.8–35.4)
MCV: 100.6 fL — AB (ref 80–97)
MID (cbc): 0.4 (ref 0–0.9)
MPV: 9.1 fL (ref 0–99.8)
POC Granulocyte: 3.1 (ref 2–6.9)
POC LYMPH PERCENT: 44.9 %L (ref 10–50)
POC MID %: 5.8 %M (ref 0–12)
Platelet Count, POC: 273 10*3/uL (ref 142–424)
RBC: 4.41 M/uL (ref 4.04–5.48)
RDW, POC: 13.2 %
WBC: 6.3 10*3/uL (ref 4.6–10.2)

## 2013-04-22 LAB — GLUCOSE, POCT (MANUAL RESULT ENTRY): POC Glucose: 99 mg/dl (ref 70–99)

## 2013-04-22 MED ORDER — BUTALBITAL-APAP-CAFFEINE 50-325-40 MG PO TABS: 1.0000 | ORAL_TABLET | Freq: Four times a day (QID) | ORAL | Status: DC | PRN

## 2013-04-22 MED ORDER — MECLIZINE HCL 25 MG PO TABS: 25.0000 mg | ORAL_TABLET | Freq: Three times a day (TID) | ORAL | Status: DC | PRN

## 2013-04-22 NOTE — Patient Instructions (Addendum)
Take Zyrtec 1 nightly and see how that does  Use the Fioricet if needed for headaches   Use the meclizine if needed for dizziness  Drink an increased amount fluids

## 2013-04-22 NOTE — Progress Notes (Signed)
Subjective: 23 year old lady who has a job as a Scientist, clinical (histocompatibility and immunogenetics) at [employer] and also is working on her Associate Professor. She has been having some problems especially last few days worse with headache and dizziness. She describes a headache that involves most of her head though sometimes more in the temporal regions. She usually gets left-sided headaches. She also has been having a dizziness. This is worse when she bends down and stands up. She is currently on her menstrual cycle. She does not feel like she's been under undue stress, though she realizes that there is the stress of a fairly new job and going to school. She sleeps about 6 or 7 hours a night. She takes some antihistamines including Benadryl and Chlor-Trimeton for her allergies. She does not smoke, occasionally drinks, and does not use any drugs. There is a family history of migraines, as well as a cerebral aneurysm in her grandmother.  Objective: Pleasant alert healthy appearing lady in no major distress. TMs normal. Eyes PERRLA. Fundi benign discs flat. Throat clear. Neck supple without nodes or thyromegaly. No carotid bruits. Chest clear. Heart regular without murmurs. And soft without masses or tenderness. Romberg negative. Coordination seems intact.  Assessment: Headache Dizziness  Plan: CBC, glucose, Bmet  Results for orders placed in visit on 04/22/13  POCT CBC      Result Value Range   WBC 6.3  4.6 - 10.2 K/uL   Lymph, poc 2.8  0.6 - 3.4   POC LYMPH PERCENT 44.9  10 - 50 %L   MID (cbc) 0.4  0 - 0.9   POC MID % 5.8  0 - 12 %M   POC Granulocyte 3.1  2 - 6.9   Granulocyte percent 49.3  37 - 80 %G   RBC 4.41  4.04 - 5.48 M/uL   Hemoglobin 13.7  12.2 - 16.2 g/dL   HCT, POC 44.4  37.7 - 47.9 %   MCV 100.6 (*) 80 - 97 fL   MCH, POC 31.1  27 - 31.2 pg   MCHC 30.9 (*) 31.8 - 35.4 g/dL   RDW, POC 13.2     Platelet Count, POC 273  142 - 424 K/uL   MPV 9.1  0 - 99.8 fL  GLUCOSE, POCT (MANUAL RESULT ENTRY)      Result Value Range   POC Glucose 99  70 - 99 mg/dl   Told her we will treat her symptomatically for the dizziness and headache. If symptoms continue to persist she is to return. She expressed some concerns about her mother having migraines and her grandmother having had a cerebral aneurysm. I do not see indication for excessive workup at this time, but will need to do more if she keeps on having problems

## 2013-04-23 LAB — BASIC METABOLIC PANEL
BUN: 11 mg/dL (ref 6–23)
CHLORIDE: 103 meq/L (ref 96–112)
CO2: 29 mEq/L (ref 19–32)
Calcium: 9.7 mg/dL (ref 8.4–10.5)
Creat: 0.91 mg/dL (ref 0.50–1.10)
GLUCOSE: 90 mg/dL (ref 70–99)
Potassium: 4.3 mEq/L (ref 3.5–5.3)
SODIUM: 139 meq/L (ref 135–145)

## 2013-04-25 ENCOUNTER — Encounter: Payer: Self-pay | Admitting: Family Medicine

## 2013-04-30 ENCOUNTER — Telehealth: Payer: Self-pay | Admitting: *Deleted

## 2013-04-30 NOTE — Telephone Encounter (Signed)
[facility] Mychart After Visit Questionnaire    Question 04/30/2013 10:23 AM   How are you feeling after your recent visit? fine   Does the recommended course of treatment seem to be helping your symptoms? No recommended course of treatment   Are you experiencing any side effects from your recommended treatment? NA   is there anything else you would like to ask your physician? I often experience extreme amounts of flatulence that can last for many days. Then mysteriously subside for weeks. Products like Gas-X only exacerbate the problem in both odor and volume. What sort of treatment options are there?   Can you please advise thru mychart. 

## 2013-05-09 ENCOUNTER — Ambulatory Visit (INDEPENDENT_AMBULATORY_CARE_PROVIDER_SITE_OTHER): Payer: BC Managed Care – PPO | Admitting: Family Medicine

## 2013-05-09 VITALS — BP 110/72 | HR 78 | Temp 98.2°F | Resp 18 | Ht 66.5 in | Wt 188.4 lb

## 2013-05-09 DIAGNOSIS — H6980 Other specified disorders of Eustachian tube, unspecified ear: Secondary | ICD-10-CM

## 2013-05-09 DIAGNOSIS — J309 Allergic rhinitis, unspecified: Secondary | ICD-10-CM

## 2013-05-09 DIAGNOSIS — H9209 Otalgia, unspecified ear: Secondary | ICD-10-CM

## 2013-05-09 DIAGNOSIS — R51 Headache: Secondary | ICD-10-CM

## 2013-05-09 MED ORDER — PREDNISONE 20 MG PO TABS: ORAL_TABLET | ORAL | Status: DC

## 2013-05-09 MED ORDER — FLUTICASONE PROPIONATE 50 MCG/ACT NA SUSP: 2.0000 | Freq: Every day | NASAL | Status: DC

## 2013-05-09 MED ORDER — TRAMADOL HCL 50 MG PO TABS: 50.0000 mg | ORAL_TABLET | Freq: Three times a day (TID) | ORAL | Status: DC | PRN

## 2013-05-09 NOTE — Patient Instructions (Signed)
Continue the Zyrtec, or try Allegra or Claritin in place of it  Take the prednisone 3 pills daily for 2 days, then 2 daily for 2 days, then one daily for 2 days. Take with breakfast  Use the fluticasone nose spray 2 sprays each nostril once daily as directed. In the event of more nose bleeding discontinue this.  Use the tramadol only if needed for severe headaches, one every 8 hours at the maximum   Return again if not improving

## 2013-05-09 NOTE — Progress Notes (Signed)
Subjective: Patient is here because she's having more problems. I saw her a few weeks ago. She has had ear pain. She continues to have nasal congestion. Headaches have persisted to the dizziness is doing better. She had a little bit of blood from her nose a couple of times.  Objective: Her TMs are normal. Eyes PERRLA. Nose slightly congested. Throat clear. Neck supple without nodes. Chest is clear to auscultation. Heart regular without murmurs.  Assessment: Otalgia Eustachian tube dysfunction Allergic rhinitis Persistent headaches  Plan: Tramadol for bad headaches over a period Continue an antihistamine, Allegra or Claritin or Zyrtec. Flonase Return if worse or not improving

## 2013-05-13 ENCOUNTER — Ambulatory Visit
Admission: RE | Admit: 2013-05-13 | Discharge: 2013-05-13 | Disposition: A | Discharge status: Home / Self Care | Payer: BC Managed Care – PPO | Source: Ambulatory Visit | Attending: Medical | Admitting: Medical

## 2013-05-13 ENCOUNTER — Encounter: Payer: Self-pay | Admitting: Medical

## 2013-05-13 ENCOUNTER — Ambulatory Visit (INDEPENDENT_AMBULATORY_CARE_PROVIDER_SITE_OTHER): Payer: BC Managed Care – PPO | Admitting: Medical

## 2013-05-13 ENCOUNTER — Other Ambulatory Visit: Payer: Self-pay | Admitting: Medical

## 2013-05-13 VITALS — BP 104/64 | HR 72 | Temp 98.4°F | Resp 16 | Wt 190.0 lb

## 2013-05-13 DIAGNOSIS — R509 Fever, unspecified: Secondary | ICD-10-CM

## 2013-05-13 DIAGNOSIS — J029 Acute pharyngitis, unspecified: Secondary | ICD-10-CM

## 2013-05-13 DIAGNOSIS — R490 Dysphonia: Secondary | ICD-10-CM

## 2013-05-13 DIAGNOSIS — K117 Disturbances of salivary secretion: Secondary | ICD-10-CM

## 2013-05-13 LAB — CBC WITH DIFFERENTIAL/PLATELET
Basophils Absolute: 0 10*3/uL (ref 0.0–0.1)
EOS ABS: 0 10*3/uL (ref 0.0–0.7)
HEMATOCRIT: 35.5 % — AB (ref 36.0–46.0)
HEMOGLOBIN: 12.6 g/dL (ref 12.0–15.0)
LYMPHS PCT: 30 % (ref 12–46)
Lymphs Abs: 3.2 10*3/uL (ref 0.7–4.0)
MCH: 33.1 pg (ref 26.0–34.0)
MCHC: 35.4 g/dL (ref 30.0–36.0)
MCV: 93.1 fL (ref 78.0–100.0)
MONOS PCT: 4 % (ref 3–12)
Monocytes Absolute: 0.4 10*3/uL (ref 0.1–1.0)
NEUTROS ABS: 7.1 10*3/uL (ref 1.7–7.7)
Neutrophils Relative %: 66 % (ref 43–77)
Platelets: 217 10*3/uL (ref 150–400)
RBC: 3.81 MIL/uL — ABNORMAL LOW (ref 3.87–5.11)
RDW: 11.9 % (ref 11.5–15.5)
WBC: 10.7 10*3/uL — AB (ref 4.0–10.5)

## 2013-05-13 MED ORDER — AMOXICILLIN-POT CLAVULANATE 875-125 MG PO TABS: 1.0000 | ORAL_TABLET | Freq: Two times a day (BID) | ORAL | Status: DC

## 2013-05-13 MED ORDER — IOHEXOL 300 MG/ML  SOLN
75.0000 mL | Freq: Once | INTRAMUSCULAR | Status: AC | PRN
  Administered 2013-05-13: 75 mL via INTRAVENOUS

## 2013-05-13 NOTE — Progress Notes (Signed)
Subjective:  Lauren Leonard is a 23 y.o. female who presents for fever and sore throat.   Her mom brought her in today.  She has not had a recent close exposure to someone with proven streptococcal pharyngitis.  She saw urgent care 4 days ago for ear pain and headache.  Those symptoms have persisted, but last night had abrupt onset of severe 8/10 sore throat, fever, difficulty swallowing and pain with talking, hoarse voice, drooling, ears still hurt, some upset stomach.  Denies cough, rash, NVD.   Used tylenol cold and flu this morning which helped the fever.  No sick contacts.  Mom thinks that she had all her child hood vaccines. No other aggravating or relieving factors.  No other c/o.   The following portions of the patient's history were reviewed and updated as appropriate: allergies, current medications, past medical history, past social history, past surgical history and problem list.  ROS as in subjective    Objective: BP 104/64  Pulse 72  Temp(Src) 98.4 F (36.9 C) (Oral)  Resp 16  Wt 190 lb (86.183 kg)  LMP 04/18/2013  General appearance: no distress, WD/WN, but ill-appearing, unable to muffle much sounds from her voice, no current drooling HEENT: normocephalic, conjunctiva/corneas normal, sclerae anicteric, nares patent, no discharge or erythema, pharynx with slight erythema, no obvious frank erythema, no exudate, some tonsillar swelling Oral cavity: MMM, no lesions  Neck: tender shoddy anterior nodes, no thyromegaly, no other obvious mass or swelling Heart: RRR, normal S1, S2, no murmurs Lungs: CTA bilaterally, no wheezes, rhonchi, or rales   Laboratory Strep test not done.     Assessment and Plan: Encounter Diagnoses  Name Primary?  . Fever, unspecified Yes  . Sore throat   . Hoarseness of voice   . Drooling    I had Dr. Redmond School supervising physician exam patient as well as help with eval and treatment.  At this point, can't rule out epiglottitis.  Strep seems less  likely but possible.  At this point we will get STAT CBC, will send for CT neck w/contrast.  I called and discussed case and imaging options with radiology.  We will call with results and next steps.    She is currently in no respiratory distress.   I discussed possible causes and treatment considerations with mother and patient.

## 2013-05-13 NOTE — Progress Notes (Signed)
Pt is approved for the CT for soft neck tissue. Approval # [id number] good today through 06/11/13.

## 2013-05-16 ENCOUNTER — Telehealth: Payer: Self-pay | Admitting: Medical

## 2013-05-16 NOTE — Telephone Encounter (Signed)
Pt states her throat is still itchy at night on the left side and nose gets stopped up. Pt can talk now. Pt states her stomach gets upset but i told her to take some probiotic and some yogurt and if not better to call over the weekend to the oncall and we can possibly get it switched

## 2013-05-16 NOTE — Telephone Encounter (Signed)
Check on her symptoms today in general?  What are current symptoms, any improvement? Fever? hoarse still?  Augmentin causes upset stomach or loose stool in some people.  Taking a probiotic or eating yogurt can help.  If it really is intolerable, we may want to switch to amoxicillin plain or other.

## 2013-05-16 NOTE — Telephone Encounter (Signed)
Pt called and stated that the medication she was given is upsetting her stomach. She asked if she could stopped medication. Please call pt back. Pt can be reached at [phone] or [phone].

## 2013-05-19 ENCOUNTER — Encounter: Payer: Self-pay | Admitting: Maternal and Fetal Medicine

## 2013-05-19 ENCOUNTER — Encounter: Payer: Self-pay | Admitting: Medical

## 2013-05-19 ENCOUNTER — Ambulatory Visit (INDEPENDENT_AMBULATORY_CARE_PROVIDER_SITE_OTHER): Payer: BC Managed Care – PPO | Admitting: Medical

## 2013-05-19 VITALS — BP 100/60 | HR 68 | Temp 98.0°F | Resp 14 | Wt 188.0 lb

## 2013-05-19 DIAGNOSIS — N61 Mastitis without abscess: Secondary | ICD-10-CM

## 2013-05-19 DIAGNOSIS — J029 Acute pharyngitis, unspecified: Secondary | ICD-10-CM

## 2013-05-19 DIAGNOSIS — R490 Dysphonia: Secondary | ICD-10-CM

## 2013-05-19 DIAGNOSIS — R509 Fever, unspecified: Secondary | ICD-10-CM

## 2013-05-19 NOTE — Progress Notes (Signed)
   Subjective:   Lauren Leonard is a 23 y.o. female presenting on 05/19/2013 with lump on breast with pus coming out  Here today for f/u and new breast c/o.   Saw me last week for abrupt onset of fever, sore throat, hoarseness, and she is on day 6 of antibiotic, doing much better.   She came in because over the weekend last night had big red lump in left breat above the areola with tenderness, and mild pus drainage.  It is improved today.  No prior similar, no recent injury or trauma to the breast.  She does wear nipple rings.  No other aggravating or relieving factors.  No other complaint.  Review of Systems ROS as in subjective      Objective:     Filed Vitals:   05/19/13 1631  BP: 100/60  Pulse: 68  Temp: 98 F (36.7 C)  Resp: 14    General appearance: alert, no distress, WD/WN HEENT: normocephalic, sclerae anicteric, TMs pearly, nares patent, no discharge or erythema, pharynx with slight erythema now Oral cavity: MMM, no lesions Neck: supple, shoddy tender anterior nodes, no thyromegaly, no masses Breasts:  Left breast just proximal to superior areolar with small 49mm diameter nodule/indurated area, but no erythema, no warmth, no fluctuance.  Right nipple ring present. otherwise no mass, no nodules, no other skin changes, no associated lymphadenopathy.  Exam chaperoned by nurse.      Assessment: Encounter Diagnoses  Name Primary?  . Sore throat Yes  . Hoarseness   . Fever, unspecified   . Cellulitis of breast      Plan: Sore throat, hoarseness, fever - mostly improved.   Finish antibiotic, rest, hydration advised, c/t OTC Ibuprofen, symptoms should gradually resolve  Cellulitis - based on residual skin findings and history, likely had mild cellulitis of breast.  No worrisome findings today.  She does wear nipple rings and the cellulitis could have been initiated by break in the skin at the nipple given the proximity.  Advised warm compress, finish Augmentin, and if  not improving or worsening later this week recheck.  Otherwise f/u 1wk.  Lauren Leonard was seen today for lump on breast with pus coming out.  Diagnoses and associated orders for this visit:  Sore throat  Hoarseness  Fever, unspecified  Cellulitis of breast    Return call/return 1wk.

## 2013-05-21 ENCOUNTER — Telehealth: Payer: Self-pay | Admitting: Medical

## 2013-05-21 ENCOUNTER — Other Ambulatory Visit: Payer: Self-pay | Admitting: Medical

## 2013-05-21 MED ORDER — AMOXICILLIN-POT CLAVULANATE 875-125 MG PO TABS: 1.0000 | ORAL_TABLET | Freq: Two times a day (BID) | ORAL | Status: DC

## 2013-05-21 NOTE — Telephone Encounter (Signed)
rx sent

## 2013-06-02 ENCOUNTER — Telehealth: Payer: Self-pay | Admitting: Medical

## 2013-06-02 ENCOUNTER — Other Ambulatory Visit: Payer: Self-pay | Admitting: Medical

## 2013-06-02 MED ORDER — FLUCONAZOLE 150 MG PO TABS: 150.0000 mg | ORAL_TABLET | Freq: Once | ORAL | Status: DC

## 2013-06-02 NOTE — Telephone Encounter (Signed)
Patient states that she doesn't think it is a yeast infection. She thinks it is BV. She said her Sx are more yellowish and not white like [place] chesses. I explain her Sx to [facility] and he said that by her being on all the antibiotics it is probably a yeast infection but if she thinks it is something else then she will need to come in for a office visit. The patient states that she will think about it and decide what she will do. CLS   Patient states that her breast area is better.CLS

## 2013-06-02 NOTE — Telephone Encounter (Signed)
lmom to cb. cls 

## 2013-06-02 NOTE — Telephone Encounter (Signed)
Pt called and stated that after being on antibiotic she is now experiencing symptoms of vaginitis. Pt is requesting that something be sent i for her. Pt uses [facility] [address]

## 2013-06-02 NOTE — Telephone Encounter (Signed)
Please verify her symptoms, but this is likely a yeast infection given her recent antibiotic use.  I did send yeast infection medication Diflucan to her pharmacy, please verify symptoms first.   Has her breast symptoms improved?

## 2013-07-10 ENCOUNTER — Ambulatory Visit (INDEPENDENT_AMBULATORY_CARE_PROVIDER_SITE_OTHER): Payer: BC Managed Care – PPO | Admitting: Family Medicine

## 2013-07-10 VITALS — BP 122/70 | HR 81 | Temp 98.2°F | Resp 16 | Ht 65.0 in | Wt 197.0 lb

## 2013-07-10 DIAGNOSIS — N898 Other specified noninflammatory disorders of vagina: Secondary | ICD-10-CM

## 2013-07-10 DIAGNOSIS — R300 Dysuria: Secondary | ICD-10-CM

## 2013-07-10 DIAGNOSIS — J029 Acute pharyngitis, unspecified: Secondary | ICD-10-CM

## 2013-07-10 LAB — POCT WET PREP WITH KOH
Clue Cells Wet Prep HPF POC: NEGATIVE
KOH PREP POC: NEGATIVE
TRICHOMONAS UA: NEGATIVE
Yeast Wet Prep HPF POC: NEGATIVE

## 2013-07-10 LAB — POCT URINALYSIS DIPSTICK
Bilirubin, UA: NEGATIVE
Blood, UA: NEGATIVE
GLUCOSE UA: NEGATIVE
Ketones, UA: NEGATIVE
Nitrite, UA: NEGATIVE
PROTEIN UA: NEGATIVE
SPEC GRAV UA: 1.015
UROBILINOGEN UA: 0.2
pH, UA: 8

## 2013-07-10 LAB — POCT UA - MICROSCOPIC ONLY
CRYSTALS, UR, HPF, POC: NEGATIVE
Casts, Ur, LPF, POC: NEGATIVE
Mucus, UA: NEGATIVE
Yeast, UA: NEGATIVE

## 2013-07-10 LAB — POCT RAPID STREP A (OFFICE): RAPID STREP A SCREEN: NEGATIVE

## 2013-07-10 NOTE — Progress Notes (Signed)
I have discussed this case with Ms. Gessner, NP and agree.  

## 2013-07-10 NOTE — Progress Notes (Signed)
Subjective:    Patient ID: Lauren Leonard, female    DOB: September 22, 1990, 23 y.o.   MRN: [id number]  HPI Vaginal discharge x 2 weeks, very itchy, no odor. Was yellow, no clear. Has not been on antibiotics recently. Not currently sexually active. Has been tested for STI since last sexual activity. Has had BV in the past. Used some metro gel x 1 this week. Felt burning. Applied Neosporin to vaginal without burning. Stopped the itch. Has occasional burning with urination, may be more from irritation of skin with urination.  Had food poisoning with diarrhea x 3 days earlier this week. No diarrhea x 2 days.  Sore throat x 3 days, ears itchy, nasal drainage and congestion. No relief with claritin on zyrtec. Has not taken anything for pain.  Has history of asthma, rarely requires albuterol inhaler. Has problems with environmental allergies year round.  Review of Systems No abdominal pain, no fever, slight cough, no SOB, no wheezing.    Objective:   Physical Exam  Vitals reviewed. Constitutional: She is oriented to person, place, and time. She appears well-developed and well-nourished. No distress.  Eyes: Conjunctivae are normal. Right eye exhibits no discharge. Left eye exhibits no discharge.  Neck: Normal range of motion. Neck supple.  Cardiovascular: Normal rate, regular rhythm and normal heart sounds.   Pulmonary/Chest: Effort normal and breath sounds normal.  Abdominal: Soft. Bowel sounds are normal.  Genitourinary: Uterus normal. Pelvic exam was performed with patient prone. There is no rash, tenderness, lesion or injury on the right labia. There is no rash, tenderness, lesion or injury on the left labia. Cervix exhibits discharge. Cervix exhibits no motion tenderness and no friability. Right adnexum displays no mass, no tenderness and no fullness. Left adnexum displays no mass, no tenderness and no fullness. No erythema, tenderness or bleeding around the vagina. No foreign body around the  vagina. No signs of injury around the vagina. Vaginal discharge found.  Thin, white, non-odorous discharge.  Musculoskeletal: Normal range of motion.  Lymphadenopathy:    She has no cervical adenopathy.  Neurological: She is alert and oriented to person, place, and time.  Skin: Skin is warm and dry. She is not diaphoretic.  Psychiatric: She has a normal mood and affect. Her behavior is normal. Judgment and thought content normal.   Results for orders placed in visit on 07/10/13  POCT WET PREP WITH KOH      Result Value Ref Range   Trichomonas, UA Negative     Clue Cells Wet Prep HPF POC neg     Epithelial Wet Prep HPF POC 3-6     Yeast Wet Prep HPF POC neg     Bacteria Wet Prep HPF POC small     RBC Wet Prep HPF POC 6-10     WBC Wet Prep HPF POC 3-5     KOH Prep POC Negative    POCT RAPID STREP A (OFFICE)      Result Value Ref Range   Rapid Strep A Screen Negative  Negative  POCT UA - MICROSCOPIC ONLY      Result Value Ref Range   WBC, Ur, HPF, POC 2-3     RBC, urine, microscopic 0-1     Bacteria, U Microscopic small     Mucus, UA neg     Epithelial cells, urine per micros 1-4     Crystals, Ur, HPF, POC neg     Casts, Ur, LPF, POC neg     Yeast, UA neg  POCT URINALYSIS DIPSTICK      Result Value Ref Range   Color, UA yellow     Clarity, UA clear     Glucose, UA neg     Bilirubin, UA neg     Ketones, UA neg     Spec Grav, UA 1.015     Blood, UA neg     pH, UA 8.0     Protein, UA neg     Urobilinogen, UA 0.2     Nitrite, UA neg     Leukocytes, UA small (1+)          Assessment & Plan:  1. Vaginal discharge and itching - POCT Wet Prep with KOH- negative -Think this is irritation, advised against fragrant soap, detergents and tight clothing.   2. Acute pharyngitis - POCT rapid strep A- negative - Culture, Group A Strep -ibuprofen 400 mg po q8 hours prn  3. Dysuria - POCT UA - Microscopic Only - POCT urinalysis dipstick -Suspect this burning is from  vaginal irritation, increase fluid intake  4- Allergic rhinitis - resume flonase, 2 sprays in each nostril every day.  Elby Beck, FNP-BC  [facility]  07/10/2013 7:33 PM

## 2013-07-13 LAB — CULTURE, GROUP A STREP: ORGANISM ID, BACTERIA: NORMAL

## 2013-08-08 ENCOUNTER — Encounter: Payer: Self-pay | Admitting: Family Medicine

## 2013-08-08 ENCOUNTER — Ambulatory Visit (INDEPENDENT_AMBULATORY_CARE_PROVIDER_SITE_OTHER): Payer: BC Managed Care – PPO | Admitting: Family Medicine

## 2013-08-08 ENCOUNTER — Telehealth: Payer: Self-pay

## 2013-08-08 VITALS — BP 98/60 | HR 72 | Ht 65.0 in | Wt 193.0 lb

## 2013-08-08 DIAGNOSIS — R51 Headache: Secondary | ICD-10-CM

## 2013-08-08 DIAGNOSIS — J309 Allergic rhinitis, unspecified: Secondary | ICD-10-CM

## 2013-08-08 MED ORDER — NAPROXEN 500 MG PO TABS: 500.0000 mg | ORAL_TABLET | Freq: Two times a day (BID) | ORAL | Status: DC

## 2013-08-08 NOTE — Progress Notes (Signed)
Chief Complaint  Patient presents with  . Headache    worse over the past 2 weeks ? she has a hair bump on her butt would like to options of how to get rid of it   Seen at Urgent Care in February with headaches.  Headaches had improved, "dulled down". Headaches have been worse over the last two weeks.  They start posteriorly, at her crown.  Sometimes she has pain at her temples, but not currently. Denies sinus headaches (better since on the Flonase).  She wakes up with headaches in the mornings (they do not wake her up from sleep), and the headaches last most of the day.  She has tried Excedrin, extra strength tylenol, and neither of these have helped.  She can't recall if she tried the Fioricet that was prescribed by the UC with this recent flare or not, but it didn't help when she tried it in the past.  Headache improves when she lies down in a comfortable position, but hurts with position changes.  Pain doesn't keep her up at night.  Headaches started the week before finals, but they have persisted after finals.  Denies any numbness, tingling, weakness.  She continues to have some dizziness--if she bends over, stands up too fast.  She feels like her equilibrium is off, only slightly lightheaded.  Dizziness can last for up to 30 minutes, needs to sit down, can't get right back up.  Before her dizziness was shorter-lived (had same symptoms when in UC in Feb; CBC was done and was normal).  She has been trying to drink a lot of water.  She gets [place] waxes.  She has a "hair bump" on her buttock for quite a while, a few months.  Denies any significant pain, drainage.  She frequently gets them, and usually applies heat and squeezes/drains and the bumps resolve.  She wonders if this could be a mole.  Past Medical History  Diagnosis Date  . Asthma   . Allergy     sinus    Past Surgical History  Procedure Laterality Date  . Cholecystectomy  06/28/12    lap chole   History   Social History  .  Marital Status: Single    Spouse Name: N/A    Number of Children: N/A  . Years of Education: N/A   Occupational History  . Not on file.   Social History Main Topics  . Smoking status: Never Smoker   . Smokeless tobacco: Never Used  . Alcohol Use: Yes     Comment: social (a few times/year)  . Drug Use: No  . Sexual Activity: Not Currently   Other Topics Concern  . Not on file   Social History Narrative  . No narrative on file   Family History  Problem Relation Age of Onset  . Cancer Maternal Grandmother     breast  . Diabetes Maternal Grandmother   . Hypertension Maternal Grandmother   . Cancer Maternal Grandfather     panreatic  . Diabetes Maternal Grandfather   . Migraines Mother   . Cerebral aneurysm Other    Outpatient Encounter Prescriptions as of 08/08/2013  Medication Sig Note  . albuterol (PROVENTIL HFA;VENTOLIN HFA) 108 (90 BASE) MCG/ACT inhaler Inhale 2 puffs into the lungs every 6 (six) hours as needed for wheezing or shortness of breath. 08/08/2013: Last used 3 weeks ago  . Ascorbic Acid (VITAMIN C ER PO) Take by mouth.   . Cyanocobalamin (VITAMIN B-12 CR PO) Take by  mouth.   . fluticasone (FLONASE) 50 MCG/ACT nasal spray Place 2 sprays into both nostrils daily.   Marland Kitchen loratadine (CLARITIN) 10 MG tablet Take 10 mg by mouth daily.   . meclizine (ANTIVERT) 25 MG tablet Take 1 tablet (25 mg total) by mouth 3 (three) times daily as needed for dizziness. 08/08/2013: Prn   . butalbital-acetaminophen-caffeine (FIORICET) 50-325-40 MG per tablet Take 1-2 tablets by mouth every 6 (six) hours as needed for headache. 08/08/2013: Medication did not help   . naproxen (NAPROSYN) 500 MG tablet Take 1 tablet (500 mg total) by mouth 2 (two) times daily with a meal.   . [DISCONTINUED] amoxicillin-clavulanate (AUGMENTIN) 875-125 MG per tablet Take 1 tablet by mouth 2 (two) times daily.   . [DISCONTINUED] fluconazole (DIFLUCAN) 150 MG tablet Take 1 tablet (150 mg total) by mouth once.    . [DISCONTINUED] metoCLOPramide (REGLAN) 10 MG tablet Take 10 mg by mouth 4 (four) times daily.    (naproxen was prescribed at today's visit)  No Known Allergies  ROS:  No fevers, chills, chest pain, palpitations.  Denies any vomiting or diarrhea. She gets nauseated easily, not necessarily related to headaches.  She had food poisoning a few weeks ago, diarrhea lasted x 2 days, resolved.  First day of her period is heavy.  They are now shorter than they used to be (last a week). No rashes or other concerns except as per HPI  PHYSICAL EXAM: BP 98/60  Pulse 72  Ht 5\' 5"  (1.651 m)  Wt 193 lb (87.544 kg)  BMI 32.12 kg/m2  LMP 07/17/2013 Well developed female, in no distress HEENT:  PERRL, EOMI, conjunctiva clear.  Fundi benign. Tender at crown--normal appearing scalp without lesions, erythema or swelling. No flaking.  Nasal mucosa mod edematous, no purulence.  Sinuses nontender Neck: nontender over spine.  No lymphadenopathy, thyromegaly or mass Heart: regular rate and rhythm without murmur Lungs: clear bilaterally Neuro: alert and oriented. Cranial nerves 2-12 intact.  Normal finger to nose, gait, strength, sensation.  DTR's 2+ and symmetric Psych: normal mood, affect, hygiene and grooming Skin: Flesh colored 1-25mm papule on L medial buttock --noninflamed, nontender  ASSESSMENT/PLAN:  Headache(784.0) - Plan: naproxen (NAPROSYN) 500 MG tablet  Allergic rhinitis, cause unspecified - improved; still some inflammation on exam.  reviewed proper technique for Flonase   Headaches--suspect related to inflammation, probably initially muscular, and related to stress.  Reassured regarding normal neurologic exam.  Trial of anti-inflammatories.  Reviewed risks/side effects.  Return in 2 weeks if ongoing headaches for re-eval. May need imaging vs referral.  Trial of heat vs ice to tender area on scalp. Maintain good posture (when reading, looking at phone, desk, computer, etc.)  ?wart vs healing  shave bump Avoid shaving the hair.  Leave it alone.  If the bump increases in size or number, have it rechecked--it is either that the inflammation has resolved, and it is going to go away, or can't rule out the possibility of a small wart (in which case it wouldn't resolve, and might need treatment).  F/u in 2 weeks prn persistent headaches

## 2013-08-08 NOTE — Patient Instructions (Signed)
  Headaches--suspect related to inflammation, probably initially muscular, and related to stress.  You had a normal neurologic exam.  Trial of anti-inflammatories.  Do not use any other pain medications except for tylenol, along with this prescription. Return in 2 weeks if ongoing headaches for re-evaluation.  Trial of heat vs ice.  Make sure you keep good posture (when reading, looking at phone, desk, computer, etc.)  Avoid shaving the hair.  Leave it alone.  If the bump increases in size or number, have it rechecked--it is either that the inflammation has resolved, and it is going to go away, or can't rule out the possibility of a small wart (in which case it wouldn't resolve, and might need treatment).

## 2013-08-08 NOTE — Telephone Encounter (Signed)
Put in the referral and spoke with the patient.

## 2013-08-08 NOTE — Telephone Encounter (Signed)
Pt is calling to request a referral to a headache specialist

## 2013-08-29 ENCOUNTER — Encounter: Payer: Self-pay | Admitting: Neurology

## 2013-08-29 ENCOUNTER — Ambulatory Visit (INDEPENDENT_AMBULATORY_CARE_PROVIDER_SITE_OTHER): Payer: BC Managed Care – PPO | Admitting: Neurology

## 2013-08-29 VITALS — BP 109/65 | HR 65 | Ht 67.0 in | Wt 198.0 lb

## 2013-08-29 DIAGNOSIS — R519 Headache, unspecified: Secondary | ICD-10-CM | POA: Insufficient documentation

## 2013-08-29 DIAGNOSIS — R51 Headache: Secondary | ICD-10-CM | POA: Insufficient documentation

## 2013-08-29 MED ORDER — SUMATRIPTAN SUCCINATE 50 MG PO TABS: 50.0000 mg | ORAL_TABLET | ORAL | Status: DC | PRN

## 2013-08-29 NOTE — Progress Notes (Signed)
[facility]    Provider:  Dr Janann Colonel Referring Provider: Carlena Hurl, PA-C Primary Care Physician:  Crisoforo Oxford, PA-C  CC:  headache  HPI:  Lauren Leonard is a 23 y.o. female here as a referral from Dr. Glade Lloyd for headache evaluation  Started in February, getting progressively worse. Currently occuring every day, can last 20 minutes to hours. Comes on quickly. No aura. Predominantly on top of her head, described as sharp pounding pain. At worst will get to a 8/10. + Photophobia, notes some nausea. No focal motor or sensory changes. Notes some blurry vision with the headache but otherwise normal. No transient obscurations. Notes some dizziness and light headed sensation when bending over. No known triggers Headaches started during finals but has continued. No history of head trauma. Notes 30lb weight gain in the past month.   Has difficulty falling asleep. One cup of coffee a day. Has strong maternal history of brain aneurysms. Mother has migraines.   Review of Systems: Out of a complete 14 system review, the patient complains of only the following symptoms, and all other reviewed systems are negative. + weight gain, fatigue, easy bruising, feeling cold, headache, dizziness  History   Social History  . Marital Status: Single    Spouse Name: N/A    Number of Children: N/A  . Years of Education: N/A   Occupational History  . Not on file.   Social History Main Topics  . Smoking status: Never Smoker   . Smokeless tobacco: Never Used  . Alcohol Use: Yes     Comment: social (a few times/year)  . Drug Use: No  . Sexual Activity: Not Currently   Other Topics Concern  . 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 Cancer Maternal Grandmother     breast  . Diabetes  Maternal Grandmother   . Hypertension Maternal Grandmother   . Cancer Maternal Grandfather     panreatic  . Diabetes Maternal Grandfather   . Migraines Mother   . Cerebral aneurysm Other     Past Medical History  Diagnosis Date  . Asthma   . Allergy     sinus     Past Surgical History  Procedure Laterality Date  . Cholecystectomy  06/28/12    lap chole    Current Outpatient Prescriptions  Medication Sig Dispense Refill  . albuterol (PROVENTIL HFA;VENTOLIN HFA) 108 (90 BASE) MCG/ACT inhaler Inhale 2 puffs into the lungs every 6 (six) hours as needed for wheezing or shortness of breath.  1 Inhaler  0  . Ascorbic Acid (VITAMIN C ER PO) Take by mouth.      . butalbital-acetaminophen-caffeine (FIORICET) 50-325-40 MG per tablet Take 1-2 tablets by mouth every 6 (six) hours as needed for headache.  30 tablet  0  . Cyanocobalamin (VITAMIN B-12 CR PO) Take by mouth.      . fluticasone (FLONASE) 50 MCG/ACT nasal spray Place 2 sprays into both nostrils daily.  16 g  2  . loratadine (CLARITIN) 10 MG tablet Take 10 mg by mouth daily.      . meclizine (ANTIVERT) 25 MG tablet Take 1 tablet (25 mg total) by mouth 3 (three) times daily as needed for dizziness.  30 tablet  0  .  naproxen (NAPROSYN) 500 MG tablet Take 1 tablet (500 mg total) by mouth 2 (two) times daily with a meal.  30 tablet  0   No current facility-administered medications for this visit.    Allergies as of 08/29/2013 - Review Complete 08/29/2013  Allergen Reaction Noted  . Latex Other (See Comments) 08/29/2013  . Citric acid Rash 08/29/2013    Vitals: BP 109/65  Pulse 65  Ht 5\' 7"  (1.702 m)  Wt 198 lb (89.812 kg)  BMI 31.00 kg/m2  LMP 07/17/2013 Last Weight:  Wt Readings from Last 1 Encounters:  08/29/13 198 lb (89.812 kg)   Last Height:   Ht Readings from Last 1 Encounters:  08/29/13 5\' 7"  (1.702 m)     Physical exam: Exam: Gen: NAD, conversant Eyes: anicteric sclerae, moist conjunctivae HENT:  Atraumatic, oropharynx clear Neck: Trachea midline; supple,  Lungs: CTA, no wheezing, rales, rhonic                          CV: RRR, no MRG Abdomen: Soft, non-tender;  Extremities: No peripheral edema  Skin: Normal temperature, no rash,  Psych: Appropriate affect, pleasant  Neuro: MS: AA&Ox3, appropriately interactive, normal affect   Attention: WORLD backwards  Speech: fluent w/o paraphasic error  Memory: good recent and remote recall  CN: PERRL, EOMI no nystagmus, optic discs wnl bilaterally, VFF to FC bilat,  no ptosis, sensation intact to LT V1-V3 bilat, face symmetric, no weakness, hearing grossly intact, palate elevates symmetrically, shoulder shrug 5/5 bilat,  tongue protrudes midline, no fasiculations noted.  Motor: normal bulk and tone Strength: 5/5  In all extremities  Coord: rapid alternating and point-to-point (FNF, HTS) movements intact.  Reflexes: symmetrical, bilat downgoing toes  Sens: LT intact in all extremities  Gait: posture, stance, stride and arm-swing normal. Tandem gait intact. Able to walk on heels and toes. Romberg absent.   Assessment:  After physical and neurologic examination, review of laboratory studies, imaging, neurophysiology testing and pre-existing records, assessment will be reviewed on the problem list.  Plan:  Treatment plan and additional workup will be reviewed under Problem List.  1)Headache 2)Dizziness 3)Weight gain  22y/o woman presenting for initial evaluation of frequent headaches. Based on description these appear to be consistent with a diagnosis of migraine without aura. With recent weight gain would also consider IIH but minimal vision change and overall picture not typical for this. If symptoms worsen would consider LP. Strong family history of aneurysm raises the concern for a vascular process. Will check MRA brain. Will start patient on Imitrex 50mg  as needed. If no improvement can consider a daily preventive agent.  Follow up once MRA completed. Patient instructed to call office in 4 weeks to update me on her status.   Jim Like, DO  [facility] [address] [place], [place] [address]  Phone [phone] Fax [phone]

## 2013-08-29 NOTE — Patient Instructions (Signed)
Overall you are doing fairly well but I do want to suggest a few things today:   Remember to drink plenty of fluid, eat healthy meals and do not skip any meals. Try to eat protein with a every meal and eat a healthy snack such as fruit or nuts in between meals. Try to keep a regular sleep-wake schedule and try to exercise daily, particularly in the form of walking, 20-30 minutes a day, if you can.   As far as your medications are concerned, I would like to suggest the following: 1)Please start Imitrex 50mg . Take one tablet at headache onset and if no improvement in 2 hours then take the 2nd tablet. No more than 2 tabs in 24hrs and try to not use more than 3 days a week  As far as diagnostic testing:  1)I would like you to have a MRA of your brain to check for an aneurysm. You will be called to schedule this.   Follow up once MRA completed. Please call us with any interim questions, concerns, problems, updates or refill requests.   My clinical assistant and will answer any of your questions and relay your messages to me and also relay most of my messages to you.   Our phone number is [phone]. We also have an after hours call service for urgent matters and there is a physician on-call for urgent questions. For any emergencies you know to call 911 or go to the nearest emergency room

## 2013-09-16 ENCOUNTER — Ambulatory Visit: Payer: BC Managed Care – PPO

## 2013-10-24 ENCOUNTER — Encounter: Payer: Self-pay | Admitting: Medical

## 2013-10-24 ENCOUNTER — Ambulatory Visit: Payer: BC Managed Care – PPO | Admitting: Medical

## 2013-10-24 ENCOUNTER — Ambulatory Visit (INDEPENDENT_AMBULATORY_CARE_PROVIDER_SITE_OTHER): Payer: BC Managed Care – PPO | Admitting: Medical

## 2013-10-24 VITALS — BP 100/60 | HR 78 | Temp 97.8°F | Resp 16 | Wt 190.0 lb

## 2013-10-24 DIAGNOSIS — N898 Other specified noninflammatory disorders of vagina: Secondary | ICD-10-CM

## 2013-10-24 LAB — POCT WET PREP (WET MOUNT)
BACTERIA WET PREP HPF POC: NEGATIVE
Clue Cells Wet Prep Whiff POC: NEGATIVE
KOH WET PREP POC: NEGATIVE
Trichomonas Wet Prep HPF POC: NEGATIVE

## 2013-10-24 MED ORDER — METRONIDAZOLE 0.75 % VA GEL: 1.0000 | Freq: Two times a day (BID) | VAGINAL | Status: DC

## 2013-10-24 MED ORDER — FLUCONAZOLE 150 MG PO TABS: 150.0000 mg | ORAL_TABLET | Freq: Once | ORAL | Status: DC

## 2013-10-24 NOTE — Progress Notes (Signed)
  Subjective:     Lauren Leonard is a 23 y.o. female who presents for evaluation of an abnormal vaginal discharge. Symptoms have been present for 3 weeks. Vaginal symptoms: discharge described as white and creamy, odor and vulvar itching. Contraception: none. She denies blisters, bumps, dyspareunia, pain and post coital bleeding Sexually transmitted infection risk: possible STD exposure. Menstrual flow: regular every 28-30 days.  Current boyfriend x 37mo, not using condoms.  Hx/o gonorrhea in 2010, treated.    The following portions of the patient's history were reviewed and updated as appropriate: allergies, current medications, past family history, past medical history, past social history, past surgical history and problem list.   Review of Systems As in subjective   Objective:   Gen: wd, wn, nad Gyn: Normal external genitalia without lesions, vagina with normal mucosa, cervix without lesions, no cervical motion tenderness, white thick mild abnormal vaginal discharge.  Uterus and adnexa not enlarged, nontender, no masses.  Exam chaperoned by nurse.      Assessment:   Encounter Diagnosis  Name Primary?  . Vaginal discharge Yes     Plan:   discused symptoms, possible causes, STD labs sent.  + for BV and yeast per wet prep.  Symptomatic local care discussed. Oral antifungal see orders. Vaginal antibiotic see orders. Abstinence from intercourse discussed. Discussed safe sex.  F/u pending labs.

## 2013-10-25 LAB — HIV ANTIBODY (ROUTINE TESTING W REFLEX): HIV 1&2 Ab, 4th Generation: NONREACTIVE

## 2013-10-25 LAB — GC/CHLAMYDIA PROBE AMP
CT Probe RNA: NEGATIVE
GC Probe RNA: NEGATIVE

## 2013-10-25 LAB — RPR

## 2013-10-27 ENCOUNTER — Other Ambulatory Visit: Payer: Self-pay | Admitting: Family Medicine

## 2013-10-27 DIAGNOSIS — H9209 Otalgia, unspecified ear: Secondary | ICD-10-CM

## 2013-10-27 DIAGNOSIS — R51 Headache: Secondary | ICD-10-CM

## 2013-10-27 MED ORDER — FLUTICASONE PROPIONATE 50 MCG/ACT NA SUSP: 2.0000 | Freq: Every day | NASAL | Status: DC

## 2013-11-27 ENCOUNTER — Telehealth: Payer: Self-pay | Admitting: Family Medicine

## 2013-11-27 NOTE — Telephone Encounter (Signed)
Diana see msg.  We may want to send the fax refill request to whoever investigates fraud.

## 2013-11-27 NOTE — Telephone Encounter (Signed)
Pain management compound rx form request refill for A-15 & Scar no More.  Fax [phone]

## 2013-11-27 NOTE — Telephone Encounter (Signed)
LM to CB [facility] 

## 2013-11-27 NOTE — Telephone Encounter (Signed)
Spoke with patient, seems a drug company contacted her about her provider prescribing her this cream. She does not want it or need it.

## 2013-11-27 NOTE — Telephone Encounter (Signed)
What is this?  Not familiar with this, and I don't believe she has a medical problem related to this?  pls inquire

## 2014-01-01 ENCOUNTER — Other Ambulatory Visit: Payer: Self-pay | Admitting: Medical

## 2014-01-01 ENCOUNTER — Ambulatory Visit (INDEPENDENT_AMBULATORY_CARE_PROVIDER_SITE_OTHER): Payer: BC Managed Care – PPO | Admitting: Medical

## 2014-01-01 ENCOUNTER — Telehealth: Payer: Self-pay | Admitting: Medical

## 2014-01-01 ENCOUNTER — Encounter: Payer: Self-pay | Admitting: Medical

## 2014-01-01 VITALS — BP 110/70 | HR 68 | Temp 97.8°F | Resp 15 | Wt 194.0 lb

## 2014-01-01 DIAGNOSIS — T148 Other injury of unspecified body region: Secondary | ICD-10-CM

## 2014-01-01 DIAGNOSIS — J0110 Acute frontal sinusitis, unspecified: Secondary | ICD-10-CM

## 2014-01-01 DIAGNOSIS — T148XXA Other injury of unspecified body region, initial encounter: Secondary | ICD-10-CM

## 2014-01-01 DIAGNOSIS — Z20818 Contact with and (suspected) exposure to other bacterial communicable diseases: Secondary | ICD-10-CM

## 2014-01-01 DIAGNOSIS — Z2089 Contact with and (suspected) exposure to other communicable diseases: Secondary | ICD-10-CM

## 2014-01-01 DIAGNOSIS — Z23 Encounter for immunization: Secondary | ICD-10-CM

## 2014-01-01 MED ORDER — MUPIROCIN 2 % EX OINT: 1.0000 "application " | TOPICAL_OINTMENT | Freq: Three times a day (TID) | CUTANEOUS | Status: DC

## 2014-01-01 MED ORDER — DOXYCYCLINE HYCLATE 100 MG PO TABS: 100.0000 mg | ORAL_TABLET | Freq: Two times a day (BID) | ORAL | Status: DC

## 2014-01-01 NOTE — Telephone Encounter (Signed)
If she personally doesn't have a wound, I can put her on Mupirocin ointment to use in the nostrils TID x 1 week, and she wouldn't need to come in. If she does have a wound then come in.   Also, isn't she due for f/u on medication/mood at this time, see last OV note?

## 2014-01-01 NOTE — Telephone Encounter (Signed)
Wendy-please handle this

## 2014-01-01 NOTE — Progress Notes (Signed)
Subjective:  Lauren Leonard is a 23 y.o. female who presents for possible sinus infection.  Symptoms include 2 wk hx/o sinus pressure, ear pressure, scratchy throat, fatigue, headache.  Denies fever, NVD.  Past history is significant for occasional sinusitis.  Using nothing for symptoms.  Denies sick contacts.  No other aggravating or relieving factors.    Is helping take care of mother's skin wound that was +MRSA.  While helping to dress the wound, she ended up scratching her hand with her finger nail.  Now worried about MRSA.   No other c/o.  ROS as in subjective   Objective: Filed Vitals:   01/01/14 1538  BP: 110/70  Pulse: 68  Temp: 97.8 F (36.6 C)  Resp: 15    General appearance: Alert, WD/WN, no distress                             Skin: warm, abrasion small of left palm and in between 2nd and 3rd finger web on left                           Head: +frontal sinus tenderness,                            Eyes: conjunctiva normal, corneas clear, PERRLA                            Ears: flat TMs, external ear canals normal                          Nose: septum midline, turbinates swollen, with erythema and no discharge             Mouth/throat: MMM, tongue normal, mild pharyngeal erythema                           Neck: supple, no adenopathy, no thyromegaly, nontender                          Heart: RRR, normal S1, S2, no murmurs                         Lungs: CTA bilaterally, no wheezes, rales, or rhonchi      Assessment and Plan:   Encounter Diagnoses  Name Primary?  Marland Kitchen MRSA exposure Yes  . Skin abrasion   . Acute frontal sinusitis, recurrence not specified   . Need for prophylactic vaccination and inoculation against influenza    MRSA exposure, abrasion - begin mupirocin ointment nasal TID x 1 wk, doxycycline oral x 10 days  sinusitis - doxycycline oral, can use OTC Mucinex for congestion.  Tylenol or Ibuprofen OTC for fever and malaise.  Discussed symptomatic relief,  nasal saline flush, and call or return if worse or not improving in 2-3 days.    Counseled on the influenza virus vaccine.  Vaccine information sheet given.  Influenza vaccine given after consent obtained.

## 2014-01-01 NOTE — Telephone Encounter (Signed)
Pt was exposed to MRSA-Staph wound that her mom has starting on Sunday when she tried to help clean the area of blood and bandage it. They recently found out it was MRSA and Staph. Pt made appt for today at 3:30 to be checked due to this exposure. Does she need to be seen? She has bee sneezing and coughing over the weekend if this makes a difference.

## 2014-01-01 NOTE — Telephone Encounter (Signed)
LM to CB

## 2014-01-01 NOTE — Telephone Encounter (Signed)
Came for appointment

## 2014-01-05 NOTE — Addendum Note (Signed)
Addended by: Christin Bach L on: 01/05/2014 12:01 PM   Modules accepted: Orders

## 2014-01-19 ENCOUNTER — Encounter: Payer: Self-pay | Admitting: Medical

## 2014-02-25 ENCOUNTER — Other Ambulatory Visit: Payer: BC Managed Care – PPO

## 2014-02-26 ENCOUNTER — Ambulatory Visit (INDEPENDENT_AMBULATORY_CARE_PROVIDER_SITE_OTHER): Payer: BC Managed Care – PPO

## 2014-02-26 ENCOUNTER — Encounter: Payer: Self-pay | Admitting: *Deleted

## 2014-02-26 DIAGNOSIS — R51 Headache: Secondary | ICD-10-CM

## 2014-02-26 DIAGNOSIS — R519 Headache, unspecified: Secondary | ICD-10-CM

## 2014-02-27 ENCOUNTER — Telehealth: Payer: Self-pay | Admitting: Diagnostic Neuroimaging

## 2014-02-27 DIAGNOSIS — G930 Cerebral cysts: Secondary | ICD-10-CM

## 2014-02-27 DIAGNOSIS — R519 Headache, unspecified: Secondary | ICD-10-CM

## 2014-02-27 DIAGNOSIS — R51 Headache: Secondary | ICD-10-CM

## 2014-02-27 NOTE — Telephone Encounter (Signed)
I called patient. Gave MRA results. Small cyst needs follow up MRI brain (with and without). Also patient needs to establish with a new MD in our practice (former [facility] patient).   Penni Bombard, MD 25/49/8264, 1:58 PM Certified in Neurology, Neurophysiology and Neuroimaging  [facility] [address], [place], [place] [address] [phone]

## 2014-03-03 NOTE — Telephone Encounter (Signed)
Pt calling back to return CaSandra's call. Wants you to call her at work @ [phone].  She will be at her desk from 10:50 to 1:00pm.

## 2014-03-03 NOTE — Telephone Encounter (Addendum)
Spoke to patient. Scheduled follow up appt w/ Dr. Leta Baptist. Patient will have MRI on tomorrow.

## 2014-03-03 NOTE — Telephone Encounter (Signed)
Called patient to schedule appt., no answer on cell#. Spoke to mother. She gave a work# for patient which was disconnected. Called patient's cell# left vmail.

## 2014-03-04 ENCOUNTER — Ambulatory Visit (INDEPENDENT_AMBULATORY_CARE_PROVIDER_SITE_OTHER): Payer: BC Managed Care – PPO

## 2014-03-04 ENCOUNTER — Encounter: Payer: Self-pay | Admitting: *Deleted

## 2014-03-04 DIAGNOSIS — R519 Headache, unspecified: Secondary | ICD-10-CM

## 2014-03-04 DIAGNOSIS — R51 Headache: Secondary | ICD-10-CM

## 2014-03-04 DIAGNOSIS — G930 Cerebral cysts: Secondary | ICD-10-CM

## 2014-03-05 ENCOUNTER — Telehealth: Payer: Self-pay | Admitting: Diagnostic Neuroimaging

## 2014-03-05 DIAGNOSIS — Q046 Congenital cerebral cysts: Secondary | ICD-10-CM

## 2014-03-05 MED ORDER — GADOPENTETATE DIMEGLUMINE 469.01 MG/ML IV SOLN: 20.0000 mL | Freq: Once | INTRAVENOUS | Status: AC | PRN

## 2014-03-05 NOTE — Telephone Encounter (Signed)
I called patient. Gave MRI results. 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 HA are getting worse. Will setup expedited neurosurgery consult for next week.    Penni Bombard, MD 32/02/2481, 5:00 PM Certified in Neurology, Neurophysiology and Neuroimaging  [facility] [address] [place], [place] [address] [phone]

## 2014-03-05 NOTE — Telephone Encounter (Signed)
Left vmail explaining results not in as of yet. Advised of MRI turn around time. Patient has an appt on 03/10/14.

## 2014-03-05 NOTE — Telephone Encounter (Signed)
Pt is calling requesting MRI results and wants to speak with Dr. Leta Baptist as well.  Please call and advise.

## 2014-03-10 ENCOUNTER — Telehealth: Payer: Self-pay | Admitting: Diagnostic Neuroimaging

## 2014-03-10 ENCOUNTER — Ambulatory Visit: Payer: Self-pay | Admitting: Diagnostic Neuroimaging

## 2014-03-10 NOTE — Telephone Encounter (Signed)
Spoke to patient. She will try to make arrangements to have disk picked up before appt with [facility].

## 2014-03-10 NOTE — Telephone Encounter (Signed)
Patient requesting referral for Neurosurgery sent somewhere else.  Hadn't heard from [facility] and they haven't returned any of her calls.  Please call and advise.

## 2014-03-10 NOTE — Telephone Encounter (Signed)
Spoke to [facility].  Patient is aware she is scheduled for an office visit at [facility] next week. Will advise patient to bring MRI disk. Called patient, no answer.

## 2014-03-16 ENCOUNTER — Other Ambulatory Visit: Payer: Self-pay | Admitting: Family Medicine

## 2014-03-16 ENCOUNTER — Telehealth: Payer: Self-pay | Admitting: Medical

## 2014-03-16 ENCOUNTER — Telehealth: Payer: Self-pay | Admitting: Diagnostic Neuroimaging

## 2014-03-16 ENCOUNTER — Ambulatory Visit (INDEPENDENT_AMBULATORY_CARE_PROVIDER_SITE_OTHER): Payer: BC Managed Care – PPO | Admitting: Medical

## 2014-03-16 ENCOUNTER — Encounter: Payer: Self-pay | Admitting: Medical

## 2014-03-16 VITALS — BP 100/60 | HR 78 | Temp 98.7°F | Resp 16 | Wt 183.0 lb

## 2014-03-16 DIAGNOSIS — R0982 Postnasal drip: Secondary | ICD-10-CM

## 2014-03-16 DIAGNOSIS — J029 Acute pharyngitis, unspecified: Secondary | ICD-10-CM

## 2014-03-16 DIAGNOSIS — R6889 Other general symptoms and signs: Secondary | ICD-10-CM

## 2014-03-16 DIAGNOSIS — R0989 Other specified symptoms and signs involving the circulatory and respiratory systems: Secondary | ICD-10-CM

## 2014-03-16 DIAGNOSIS — R070 Pain in throat: Secondary | ICD-10-CM

## 2014-03-16 DIAGNOSIS — J329 Chronic sinusitis, unspecified: Secondary | ICD-10-CM

## 2014-03-16 DIAGNOSIS — R198 Other specified symptoms and signs involving the digestive system and abdomen: Secondary | ICD-10-CM

## 2014-03-16 LAB — POCT RAPID STREP A (OFFICE): Rapid Strep A Screen: NEGATIVE

## 2014-03-16 MED ORDER — METHYLPREDNISOLONE (PAK) 4 MG PO TABS
ORAL_TABLET | ORAL | Status: DC
Start: 2014-03-16 — End: 2014-04-06

## 2014-03-16 MED ORDER — METHYLPREDNISOLONE (PAK) 4 MG PO TABS: ORAL_TABLET | ORAL | Status: DC

## 2014-03-16 NOTE — Telephone Encounter (Signed)
Pt is calling stating she can pick up disc for appointment if she needs to.  Please call and advise. Try her work number first [phone] then [phone].

## 2014-03-16 NOTE — Progress Notes (Signed)
Subjective:  Lauren Leonard is a 23 y.o. female who presents for throat discomfort.  She has not had a recent close exposure to someone with proven streptococcal pharyngitis or mono.  Associated symptoms include 5 day hx/o sensation that throat is closing up.  He has had some ear discomfort, some congestion, postnasal drainage, lymph nodes swollen.  She denies fever, nausea, vomiting, wheezing, chest tightness, shortness of breath, no cough. Has tried Benadryl, nasal allergy medicine, albuterol and nothing seems to help.  No sick contacts. Denies rash, no new foods or unusual foods, no mouth itching or redness. No other aggravating or relieving factors.  No other c/o.  The following portions of the patient's history were reviewed and updated as appropriate: allergies, current medications, past medical history, past social history, past surgical history and problem list.  ROS as in subjective   Objective: Filed Vitals:   03/16/14 1335  BP: 100/60  Pulse: 78  Temp: 98.7 F (37.1 C)  Resp: 16    General appearance: no distress, WD/WN HEENT: normocephalic, conjunctiva/corneas normal, sclerae anicteric, nares patent, no discharge or erythema, pharynx with mild erythema, post nasal drainage, tonsils 1-2+ bilat, no obvious tonsillar exudate, of note, superior epiglottis visible, but not specifically swollen  Oral cavity: MMM, no lesions  Neck: supple, shoddy anterior nodes, tender on the left, no thyromegaly, no mass or swelling Heart: RRR, normal S1, S2, no murmurs Lungs: CTA bilaterally, no wheezes, rhonchi, or rales   Laboratory Strep test done. Results:negative.    Assessment and Plan: Encounter Diagnoses  Name Primary?  . Sore throat Yes  . Throat discomfort   . Throat fullness   . Post-nasal drainage     Advised that symptoms and exam suggest a likely viral etiology.  Discussed symptomatic treatment including benadryl BID, medrol dosepak, Afrin OTC the next 2-3 days, salt water  gargles, warm fluids, rest, hydrate well, can use over-the-counter Tylenol for throat pain, fever, or malaise. If worse or not improving within 2-3 days, call or return.

## 2014-03-16 NOTE — Telephone Encounter (Signed)
Called patient to advise disk is at the front desk, ready for pick up. No answer on either number.

## 2014-03-16 NOTE — Telephone Encounter (Signed)
Patient is aware Dorothea Ogle [facility] message and his recommendations.

## 2014-03-16 NOTE — Telephone Encounter (Signed)
Please call her back and advised that strep test was negative.  Have her use Benadryl OTC, 1/4-1/2 dose daytime, regular dose night time, salt water gargles, warm fluids such as tea and coffee to sooth throat discomfort, begin the Medrol dosepak steroid i sent to pharmacy, Tylenol for pain prn, and use OTC Afrin nasal spray 2-3 times daily the next 2-3 days only.    If not much improved in the next 48 hours let me know.

## 2014-03-16 NOTE — Telephone Encounter (Signed)
Spoke to patient. She states her Mother Mardene Celeste will be by the office to pick up disk tomorrow.

## 2014-03-27 ENCOUNTER — Other Ambulatory Visit: Payer: Self-pay | Admitting: Medical

## 2014-03-27 ENCOUNTER — Telehealth: Payer: Self-pay | Admitting: Family Medicine

## 2014-03-27 DIAGNOSIS — R0981 Nasal congestion: Secondary | ICD-10-CM

## 2014-03-27 DIAGNOSIS — H9209 Otalgia, unspecified ear: Secondary | ICD-10-CM

## 2014-03-27 MED ORDER — FLUTICASONE PROPIONATE 50 MCG/ACT NA SUSP: 2.0000 | Freq: Every day | NASAL | Status: DC

## 2014-03-27 NOTE — Telephone Encounter (Signed)
Patient called and said that most of her symptoms have cleared up but she is sneezing, stuffy and can't breathe through her nose. Can she get a refill on Flonase and what other recommendations do you have.

## 2014-03-27 NOTE — Telephone Encounter (Signed)
So no more sore throat, no fever, no green mucous from sinuses?   If not, c/t flonase (refilled), do neti pot/nasal saline flush 1-2 times daily, and drink at least 64oz water daily

## 2014-03-27 NOTE — Telephone Encounter (Signed)
Patient does not have a sore throat, no fever and no green mucos. Patient is aware of [name] PA message and recommendations

## 2014-03-31 ENCOUNTER — Telehealth: Payer: Self-pay | Admitting: Family Medicine

## 2014-03-31 NOTE — Telephone Encounter (Signed)
Recheck and let me re-examine.   If throat infection cleared up or if no sign of infection, this could be an allergy issue.

## 2014-03-31 NOTE — Telephone Encounter (Signed)
Patient is aware of [name] PA message and she will call us back to schedule her appt.

## 2014-03-31 NOTE — Telephone Encounter (Signed)
Patient states that she feels like her throat is closing up again and she doesn't know what to do. Please, advise

## 2014-04-06 ENCOUNTER — Ambulatory Visit (INDEPENDENT_AMBULATORY_CARE_PROVIDER_SITE_OTHER): Payer: BC Managed Care – PPO | Admitting: Family Medicine

## 2014-04-06 ENCOUNTER — Encounter: Payer: Self-pay | Admitting: Family Medicine

## 2014-04-06 VITALS — BP 100/64 | HR 60 | Temp 97.8°F | Ht 66.0 in | Wt 196.0 lb

## 2014-04-06 DIAGNOSIS — J302 Other seasonal allergic rhinitis: Secondary | ICD-10-CM

## 2014-04-06 DIAGNOSIS — R102 Pelvic and perineal pain: Secondary | ICD-10-CM

## 2014-04-06 DIAGNOSIS — R221 Localized swelling, mass and lump, neck: Secondary | ICD-10-CM

## 2014-04-06 DIAGNOSIS — N898 Other specified noninflammatory disorders of vagina: Secondary | ICD-10-CM

## 2014-04-06 LAB — POCT WET PREP (WET MOUNT)

## 2014-04-06 LAB — POCT URINALYSIS DIPSTICK
BILIRUBIN UA: NEGATIVE
GLUCOSE UA: NEGATIVE
KETONES UA: NEGATIVE
Leukocytes, UA: NEGATIVE
Nitrite, UA: NEGATIVE
Protein, UA: NEGATIVE
RBC UA: NEGATIVE
Urobilinogen, UA: NEGATIVE
pH, UA: 6

## 2014-04-06 NOTE — Patient Instructions (Signed)
Continue to use claritin and Flonase daily for allergies, as well as sudafed as needed to help with postnasal drainage and sinus congestion. Drink plenty of fluids. Your throat looks clear.  Return if increasing swelling, pain, fever.  Use condoms to prevent pregnancy and sexually transmitted diseases. We will be in touch within a few days with your results. There did not appear to be any obvious vaginal infection today.

## 2014-04-06 NOTE — Progress Notes (Signed)
Chief Complaint  Patient presents with  . Advice Only    feel like the left side of her throat is closing up-saw Audelia Acton 03/16/14 and was prescribed prednisone pack-felt better and then this past week came back again. She also states that her mucus is yellow in color.  . Vaginal Discharge    that started last night. Faint odor, no itching and no pain. (told her that you may not be able to address this today but you would try).   Seen 12/28 with what was felt to be a viral illness.  She was treated with medrol dosepak and benadryl.  Symptoms of congestion and throat swelling had resolved.  She had some recurrence of sneezing 1/8 (and called for Flonase refill).  She called on 1/12 with concern over recurrent swelling in throat.  She has been taking the flonase as well as some sudafed, and throat closing sensation has improved. She has also used her inhaler when she felt like her throat was closing, and it seemed to help.  Thick yellow vaginal discharge with faint odor started last night.  Slight itching, but hair is growing back from shaving, so isn't sure if it could be related to that.  Discharge remains thick today, only a faint odor.  She has 1 sexual partner.  Usually uses condom, but has had unprotected sex recently. LMP was last week.  She would like to be tested for STD.  Denies pelvic pain. She has had some urinary urgency/frequency with mild dysuria while at work last week.  Symptoms are intermittent No flank pain.  PMH, PSH, SH reviewed. Current Outpatient Prescriptions on File Prior to Visit  Medication Sig Dispense Refill  . diphenhydrAMINE (BENADRYL) 25 MG tablet Take 25 mg by mouth every 6 (six) hours as needed.    . fluticasone (FLONASE) 50 MCG/ACT nasal spray Place 2 sprays into both nostrils daily. 16 g 2  . loratadine (CLARITIN) 10 MG tablet Take 10 mg by mouth daily.    Marland Kitchen albuterol (PROVENTIL HFA;VENTOLIN HFA) 108 (90 BASE) MCG/ACT inhaler Inhale 2 puffs into the lungs every 6  (six) hours as needed for wheezing or shortness of breath. (Patient not taking: Reported on 04/06/2014) 1 Inhaler 0   No current facility-administered medications on file prior to visit.   Allergies  Allergen Reactions  . Latex Other (See Comments)  . Citric Acid Rash    Breakouts on chest and back.   ROS:  No fevers, chills, nausea, vomiting, diarrhea.  Intermittent urinary symptoms. Slight cough.  No chest pain or shortness of breath (slight in the morning when very cold). Needing albuterol prn--recently using 2-3x/day as it seems to help when her throat feels like it is closing. See HPI  PHYSICAL EXAM: BP 100/64 mmHg  Pulse 60  Temp(Src) 97.8 F (36.6 C) (Tympanic)  Ht 5\' 6"  (1.676 m)  Wt 196 lb (88.905 kg)  BMI 31.65 kg/m2 Well developed, pleasant female in no distress HEENT: PERRL, EOMI, conjunctiva clear.  OP is clear--tonsils are normal.  OP is clear--no erythema, exudate, swelling.  Moist mucus membranes Neck: no lymphadenopathy, thyromegaly or mass Abdomen: very mild abdominal pain--midway between umbilicus and pubic area--no mass, no guarding or rebound. GU:  Normal external genitalia.  Small shave bump noted on the left. Otherwise clear without lesions or rash. BUS and vaginal normal. Thin white discharge present in canal.  Cervix has ectropion, but otherwise normal. No cervical motion tenderness.  Uterus is normal.  She was mildly tender over  right adnexa--no mass, guarding or rebound tenderness.  Normal urine dip Wet prep--normal: many squams, no cluecells, PMNs, trich or yeast  ASSESSMENT/PLAN:  Vaginal discharge - reassured that microscopic exam was normal.  Will do STD check - Plan: GC/Chlamydia Probe Amp, HIV antibody, RPR, POCT Wet Prep [facility]), POCT Urinalysis Dipstick  Seasonal allergies - continue current medications  Throat swelling - normal exam.  suspect related to allergies and PND. continue antihistamine, decongestant and flonase.  return if  worsening  Adnexal pain - tender on right side--possible ovarian cyst (she has had in past).  NSAID's prn.  f/u if symptoms persist or worsen

## 2014-04-07 LAB — RPR

## 2014-04-07 LAB — HIV ANTIBODY (ROUTINE TESTING W REFLEX): HIV: NONREACTIVE

## 2014-04-08 LAB — GC/CHLAMYDIA PROBE AMP
CT Probe RNA: NEGATIVE
GC PROBE AMP APTIMA: NEGATIVE

## 2014-04-13 ENCOUNTER — Encounter: Payer: Self-pay | Admitting: Family Medicine

## 2014-04-13 ENCOUNTER — Ambulatory Visit (INDEPENDENT_AMBULATORY_CARE_PROVIDER_SITE_OTHER): Payer: BC Managed Care – PPO | Admitting: Family Medicine

## 2014-04-13 VITALS — BP 110/78 | HR 74 | Temp 98.6°F | Wt 195.0 lb

## 2014-04-13 DIAGNOSIS — J029 Acute pharyngitis, unspecified: Secondary | ICD-10-CM

## 2014-04-13 LAB — POCT RAPID STREP A (OFFICE): Rapid Strep A Screen: NEGATIVE

## 2014-04-13 NOTE — Progress Notes (Signed)
   Subjective:    Patient ID: Lauren Leonard, female    DOB: [birth year], 24 y.o.   MRN: [id number]  HPI She has a three-day history this started with sore throat followed by nasal congestion, fever and chills, dry cough. She has been using Sudafed and NyQuil.   Review of Systems     Objective:   Physical Exam alert and in no distress. Tympanic membranes and canals are normal. Throat is clear. Tonsils are normal. Neck is supple without adenopathy or thyromegaly. Cardiac exam shows a regular sinus rhythm without murmurs or gallops. Lungs are clear to auscultation. Strep test is negative       Assessment & Plan:  Acute pharyngitis, unspecified pharyngitis type - Plan: Rapid Strep A  supportive care. Call in 10 days if not better.

## 2014-04-13 NOTE — Patient Instructions (Signed)
Upper Respiratory Infection, Adult An upper respiratory infection (URI) is also sometimes known as the common cold. The upper respiratory tract includes the nose, sinuses, throat, trachea, and bronchi. Bronchi are the airways leading to the lungs. Most people improve within 1 week, but symptoms can last up to 2 weeks. A residual cough may last even longer.  CAUSES Many different viruses can infect the tissues lining the upper respiratory tract. The tissues become irritated and inflamed and often become very moist. Mucus production is also common. A cold is contagious. You can easily spread the virus to others by oral contact. This includes kissing, sharing a glass, coughing, or sneezing. Touching your mouth or nose and then touching a surface, which is then touched by another person, can also spread the virus. SYMPTOMS  Symptoms typically develop 1 to 3 days after you come in contact with a cold virus. Symptoms vary from person to person. They may include:  Runny nose.  Sneezing.  Nasal congestion.  Sinus irritation.  Sore throat.  Loss of voice (laryngitis).  Cough.  Fatigue.  Muscle aches.  Loss of appetite.  Headache.  Low-grade fever. DIAGNOSIS  You might diagnose your own cold based on familiar symptoms, since most people get a cold 2 to 3 times a year. Your caregiver can confirm this based on your exam. Most importantly, your caregiver can check that your symptoms are not due to another disease such as strep throat, sinusitis, pneumonia, asthma, or epiglottitis. Blood tests, throat tests, and X-rays are not necessary to diagnose a common cold, but they may sometimes be helpful in excluding other more serious diseases. Your caregiver will decide if any further tests are required. RISKS AND COMPLICATIONS  You may be at risk for a more severe case of the common cold if you smoke cigarettes, have chronic heart disease (such as heart failure) or lung disease (such as asthma), or if  you have a weakened immune system. The very young and very old are also at risk for more serious infections. Bacterial sinusitis, middle ear infections, and bacterial pneumonia can complicate the common cold. The common cold can worsen asthma and chronic obstructive pulmonary disease (COPD). Sometimes, these complications can require emergency medical care and may be life-threatening. PREVENTION  The best way to protect against getting a cold is to practice good hygiene. Avoid oral or hand contact with people with cold symptoms. Wash your hands often if contact occurs. There is no clear evidence that vitamin C, vitamin E, echinacea, or exercise reduces the chance of developing a cold. However, it is always recommended to get plenty of rest and practice good nutrition. TREATMENT  Treatment is directed at relieving symptoms. There is no cure. Antibiotics are not effective, because the infection is caused by a virus, not by bacteria. Treatment may include:  Increased fluid intake. Sports drinks offer valuable electrolytes, sugars, and fluids.  Breathing heated mist or steam (vaporizer or shower).  Eating chicken soup or other clear broths, and maintaining good nutrition.  Getting plenty of rest.  Using gargles or lozenges for comfort.  Controlling fevers with ibuprofen or acetaminophen as directed by your caregiver.  Increasing usage of your inhaler if you have asthma. Zinc gel and zinc lozenges, taken in the first 24 hours of the common cold, can shorten the duration and lessen the severity of symptoms. Pain medicines may help with fever, muscle aches, and throat pain. A variety of non-prescription medicines are available to treat congestion and runny nose. Your caregiver   can make recommendations and may suggest nasal or lung inhalers for other symptoms.  HOME CARE INSTRUCTIONS   Only take over-the-counter or prescription medicines for pain, discomfort, or fever as directed by your  caregiver.  Use a warm mist humidifier or inhale steam from a shower to increase air moisture. This may keep secretions moist and make it easier to breathe.  Drink enough water and fluids to keep your urine clear or pale yellow.  Rest as needed.  Return to work when your temperature has returned to normal or as your caregiver advises. You may need to stay home longer to avoid infecting others. You can also use a face mask and careful hand washing to prevent spread of the virus. SEEK MEDICAL CARE IF:   After the first few days, you feel you are getting worse rather than better.  You need your caregiver's advice about medicines to control symptoms.  You develop chills, worsening shortness of breath, or brown or red sputum. These may be signs of pneumonia.  You develop yellow or brown nasal discharge or pain in the face, especially when you bend forward. These may be signs of sinusitis.  You develop a fever, swollen neck glands, pain with swallowing, or white areas in the back of your throat. These may be signs of strep throat. SEEK IMMEDIATE MEDICAL CARE IF:   You have a fever.  You develop severe or persistent headache, ear pain, sinus pain, or chest pain.  You develop wheezing, a prolonged cough, cough up blood, or have a change in your usual mucus (if you have chronic lung disease).  You develop sore muscles or a stiff neck. Document Released: 08/30/2000 Document Revised: 05/29/2011 Document Reviewed: 06/11/2013 [employer] Patient Information 2015 [employer]. This information is not intended to replace advice given to you by your health care provider. Make sure you discuss any questions you have with your health care provider.  

## 2014-04-16 ENCOUNTER — Ambulatory Visit (INDEPENDENT_AMBULATORY_CARE_PROVIDER_SITE_OTHER): Payer: BC Managed Care – PPO | Admitting: Medical

## 2014-04-16 ENCOUNTER — Encounter: Payer: Self-pay | Admitting: Medical

## 2014-04-16 VITALS — BP 116/70 | HR 80 | Temp 98.2°F | Resp 15 | Wt 194.0 lb

## 2014-04-16 DIAGNOSIS — R0982 Postnasal drip: Secondary | ICD-10-CM

## 2014-04-16 DIAGNOSIS — N760 Acute vaginitis: Secondary | ICD-10-CM

## 2014-04-16 DIAGNOSIS — R490 Dysphonia: Secondary | ICD-10-CM

## 2014-04-16 DIAGNOSIS — A499 Bacterial infection, unspecified: Secondary | ICD-10-CM

## 2014-04-16 DIAGNOSIS — J329 Chronic sinusitis, unspecified: Secondary | ICD-10-CM

## 2014-04-16 DIAGNOSIS — B9689 Other specified bacterial agents as the cause of diseases classified elsewhere: Secondary | ICD-10-CM

## 2014-04-16 MED ORDER — METRONIDAZOLE 500 MG PO TABS: 500.0000 mg | ORAL_TABLET | Freq: Three times a day (TID) | ORAL | Status: DC

## 2014-04-16 MED ORDER — BENZONATATE 200 MG PO CAPS: 200.0000 mg | ORAL_CAPSULE | Freq: Three times a day (TID) | ORAL | Status: DC | PRN

## 2014-04-16 NOTE — Progress Notes (Signed)
Subjective:  Lauren Leonard is a 24 y.o. female who presents for f/u on hoarseness and vaginal discharge.  She has been 3 times here recently for hoarse voice and respiratory symptoms.  She has ongoing hoarse voice, cough, drainage in the back of the throat, lymph nodes seen inflamed.  Doesn't necessarily feel sick.  Using a combination of NyQuil, Sudafed, TheraFlu. She talks on the phone every day at work as she is a Scientist, clinical (histocompatibility and immunogenetics) at the [employer] having vaginal discharge, more deep yellow coloration of late, odor, was seen here for the same with Dr. Tomi Bamberger recently.  No new sexual partners, no new soaps, no change in her daily routine and hygiene.  LMP 1 week ago.  No other aggravating or relieving factors.  No other c/o.  The following portions of the patient's history were reviewed and updated as appropriate: allergies, current medications, past medical history, past social history, past surgical history and problem list.  ROS as in subjective   Objective: BP 116/70 mmHg  Pulse 80  Temp(Src) 98.2 F (36.8 C) (Oral)  Resp 15  Wt 194 lb (87.998 kg)  LMP 03/21/2014  General appearance: no distress, WD/WN HEENT: normocephalic, conjunctiva/corneas normal, sclerae anicteric, nares patent, no discharge or erythema, pharynx with mild erythema, post nasal drainage, tonsils 1+ bilat, no obvious tonsillar exudate,  superior epiglottis visible, but not specifically swollen  Oral cavity: MMM, no lesions  Neck: supple, no lymphadenopathy, no thyromegaly, no mass or swelling Heart: RRR, normal S1, S2, no murmurs Lungs: CTA bilaterally, no wheezes, rhonchi, or rales Gyn: Normal external genitalia without lesions, vagina with normal mucosa, cervix with mild erythema, no cervical motion tenderness, slight white abnormal vaginal discharge.  Uterus and adnexa not enlarged, nontender, no masses.  Exam chaperoned by nurse.    Assessment and Plan: Encounter Diagnoses  Name Primary?  . Hoarseness of voice  Yes  . Post-nasal drainage   . BV (bacterial vaginosis)     Advised that symptoms and exam suggest a post nasal drainage or vocal cord inflammation or polyp.  However she hasn't had adequate voice rest given her job.  Advised she use the next 3 days to completely rest the voice, Tessalon Perles for cough, hydrate well with water, can use hot tea for soothing, change to Allegra D, c/t Flonase, begin sample of Asmanaex inhaler 1 puff BID, and if worse or not improving within 2-3 days, call or return and we can consider ENT referral  BV - wet prep with clue cells, c/w mild BV.  Begin Metronidazole. discussed risks/benefits of medication. F/u prn

## 2014-04-29 ENCOUNTER — Ambulatory Visit (INDEPENDENT_AMBULATORY_CARE_PROVIDER_SITE_OTHER): Payer: BC Managed Care – PPO | Admitting: Family Medicine

## 2014-04-29 ENCOUNTER — Encounter: Payer: Self-pay | Admitting: Family Medicine

## 2014-04-29 VITALS — BP 100/60 | HR 84 | Temp 99.4°F | Ht 66.0 in | Wt 189.0 lb

## 2014-04-29 DIAGNOSIS — R112 Nausea with vomiting, unspecified: Secondary | ICD-10-CM

## 2014-04-29 DIAGNOSIS — K529 Noninfective gastroenteritis and colitis, unspecified: Secondary | ICD-10-CM

## 2014-04-29 MED ORDER — ONDANSETRON 4 MG PO TBDP: 4.0000 mg | ORAL_TABLET | Freq: Three times a day (TID) | ORAL | Status: DC | PRN

## 2014-04-29 NOTE — Progress Notes (Signed)
Chief Complaint  Patient presents with  . Emesis    and diarrhea-started last night. Twice this morning on the vomiting and continues to have diarrhea also. Has a bad headache that was worsened last night.    Last night she started having stomach pain.  Started in the upper stomach, spread to the whole stomach.  She had a hard time sleeping/tossing and turning and at 2:45 this morning she started vomiting.  She vomited multiple times.  She has had at least 3 episodes of diarrhea this morning.  Stool is watery and loose.  No blood or mucus.    She feels cold.  No chills.  No known fevers.  She saw her sister this weekend, who had a 24 hour virus the week before. One other friend with abdominal pain, but not vomiting/diarrhea. No other sick contacts.  Hasn't taken any medications today.  Treated for BV last month, no other ABX No travel or camping. No undercooked meats, raw or spoiled foods.  Some chest heaviness--using inhaler 2x/d while staying with friends that have mold in their home.  PMH, PSH, SH reviewed.  Current Outpatient Prescriptions on File Prior to Visit  Medication Sig Dispense Refill  . fluticasone (FLONASE) 50 MCG/ACT nasal spray Place 2 sprays into both nostrils daily. 16 g 2  . albuterol (PROVENTIL HFA;VENTOLIN HFA) 108 (90 BASE) MCG/ACT inhaler Inhale 2 puffs into the lungs every 6 (six) hours as needed for wheezing or shortness of breath. (Patient not taking: Reported on 04/29/2014) 1 Inhaler 0  . diphenhydrAMINE (BENADRYL) 25 MG tablet Take 25 mg by mouth every 6 (six) hours as needed.     No current facility-administered medications on file prior to visit.   Allergies  Allergen Reactions  . Latex Other (See Comments)  . Citric Acid Rash    Breakouts on chest and back.    ROS:  No fevers/chills, dizziness, URI symptoms. Sore throat resolved.  +vomiting, diarrhea and chest heaviness per HPI.  PHYSICAL EXAM: BP 100/60 mmHg  Pulse 84  Temp(Src) 99.4 F (37.4  C) (Tympanic)  Ht 5\' 6"  (1.676 m)  Wt 189 lb (85.73 kg)  BMI 30.52 kg/m2  LMP 04/24/2014  Well developed female, appears to be mildly ill. HEENT: PERRL, EOMi, conjunctiva clear.  TM's and EAC's normal.  Mucus membranes moist, OP clear without erythema Neck: no lymphadenopathy or mass Heart: regular rate and rhythm Lungs: clear bilaterally, no wheezes Abdomen: soft, normal bowel sounds. Very tender diffusely, worse in epigastrium and left side of abdomen. No mass or guarding, no rebound tenderness.  ASSESSMENT/PLAN:  Acute gastroenteritis  Non-intractable vomiting with nausea, vomiting of unspecified type - Plan: ondansetron (ZOFRAN ODT) 4 MG disintegrating tablet    Use the zofran as needed for nausea/vomiting. Try and stay well hydrated--take frequent small sips, rather than drinking a cup at a time. Use imodium or pepto bismol as needed to help control diarrhea. Consider using acid medications such as zantac or prilosec--this might help with the upper stomach pain, if it isn't improving with these other measures. Eat a bland diet, avoid spicy, citrus. Avoid dairy for at least 5 days.  Return if worsening pain, fever, dehydration (symptoms we discussed) or other concerns

## 2014-04-29 NOTE — Patient Instructions (Signed)
Use the zofran as needed for nausea/vomiting. Try and stay well hydrated--take frequent small sips, rather than drinking a cup at a time. Use imodium or pepto bismol as needed to help control diarrhea. Consider using acid medications such as zantac or prilosec--this might help with the upper stomach pain, if it isn't improving with these other measures. Eat a bland diet, avoid spicy, citrus. Avoid dairy for at least 5 days.  Return if worsening pain, fever, dehydration (symptoms we discussed) or other concerns  Viral Gastroenteritis Viral gastroenteritis is also known as stomach flu. This condition affects the stomach and intestinal tract. It can cause sudden diarrhea and vomiting. The illness typically lasts 3 to 8 days. Most people develop an immune response that eventually gets rid of the virus. While this natural response develops, the virus can make you quite ill. CAUSES  Many different viruses can cause gastroenteritis, such as rotavirus or noroviruses. You can catch one of these viruses by consuming contaminated food or water. You may also catch a virus by sharing utensils or other personal items with an infected person or by touching a contaminated surface. SYMPTOMS  The most common symptoms are diarrhea and vomiting. These problems can cause a severe loss of body fluids (dehydration) and a body salt (electrolyte) imbalance. Other symptoms may include:  Fever.  Headache.  Fatigue.  Abdominal pain. DIAGNOSIS  Your caregiver can usually diagnose viral gastroenteritis based on your symptoms and a physical exam. A stool sample may also be taken to test for the presence of viruses or other infections. TREATMENT  This illness typically goes away on its own. Treatments are aimed at rehydration. The most serious cases of viral gastroenteritis involve vomiting so severely that you are not able to keep fluids down. In these cases, fluids must be given through an intravenous line (IV). HOME  CARE INSTRUCTIONS   Drink enough fluids to keep your urine clear or pale yellow. Drink small amounts of fluids frequently and increase the amounts as tolerated.  Ask your caregiver for specific rehydration instructions.  Avoid:  Foods high in sugar.  Alcohol.  Carbonated drinks.  Tobacco.  Juice.  Caffeine drinks.  Extremely hot or cold fluids.  Fatty, greasy foods.  Too much intake of anything at one time.  Dairy products until 24 to 48 hours after diarrhea stops.  You may consume probiotics. Probiotics are active cultures of beneficial bacteria. They may lessen the amount and number of diarrheal stools in adults. Probiotics can be found in yogurt with active cultures and in supplements.  Wash your hands well to avoid spreading the virus.  Only take over-the-counter or prescription medicines for pain, discomfort, or fever as directed by your caregiver. Do not give aspirin to children. Antidiarrheal medicines are not recommended.  Ask your caregiver if you should continue to take your regular prescribed and over-the-counter medicines.  Keep all follow-up appointments as directed by your caregiver. SEEK IMMEDIATE MEDICAL CARE IF:   You are unable to keep fluids down.  You do not urinate at least once every 6 to 8 hours.  You develop shortness of breath.  You notice blood in your stool or vomit. This may look like coffee grounds.  You have abdominal pain that increases or is concentrated in one small area (localized).  You have persistent vomiting or diarrhea.  You have a fever.  The patient is a child younger than 3 months, and he or she has a fever.  The patient is a child older than  3 months, and he or she has a fever and persistent symptoms.  The patient is a child older than 3 months, and he or she has a fever and symptoms suddenly get worse.  The patient is a baby, and he or she has no tears when crying. MAKE SURE YOU:   Understand these  instructions.  Will watch your condition.  Will get help right away if you are not doing well or get worse. Document Released: 03/06/2005 Document Revised: 05/29/2011 Document Reviewed: 12/21/2010 [facility] Patient Information 2015 [place], [place]. This information is not intended to replace advice given to you by your health care provider. Make sure you discuss any questions you have with your health care provider.

## 2014-05-01 ENCOUNTER — Telehealth: Payer: Self-pay | Admitting: Medical

## 2014-05-01 NOTE — Telephone Encounter (Signed)
LMOM TO CB. CLS 

## 2014-05-01 NOTE — Telephone Encounter (Signed)
Yes, can drink unsweetened tea, gingerale,water, vitamin water, soup broth, etc.

## 2014-05-01 NOTE — Telephone Encounter (Addendum)
Pt was asked to drink water due upset stomach and diarrhea. Pt wants to know if can drink warm tea. She can be reached at [phone] or [phone]

## 2014-05-04 NOTE — Telephone Encounter (Signed)
Pt notified of shane's recommendations

## 2014-05-26 ENCOUNTER — Telehealth: Payer: Self-pay | Admitting: Family Medicine

## 2014-05-26 NOTE — Telephone Encounter (Signed)
I left a detailed message on the cell phone voicemail

## 2014-05-26 NOTE — Telephone Encounter (Signed)
Initially hold pressure over the cartilage of the nose for up to 15 min to stop the bleed.   If not stopping, then come in.

## 2014-05-26 NOTE — Telephone Encounter (Signed)
Patient called and said that she had a nose bleed. Please, advise her what she should do?

## 2014-06-02 ENCOUNTER — Ambulatory Visit (INDEPENDENT_AMBULATORY_CARE_PROVIDER_SITE_OTHER): Payer: BC Managed Care – PPO | Admitting: Medical

## 2014-06-02 ENCOUNTER — Encounter: Payer: Self-pay | Admitting: Medical

## 2014-06-02 ENCOUNTER — Telehealth: Payer: Self-pay | Admitting: Medical

## 2014-06-02 VITALS — BP 100/60 | HR 76 | Temp 98.1°F | Resp 16 | Wt 194.0 lb

## 2014-06-02 DIAGNOSIS — J029 Acute pharyngitis, unspecified: Secondary | ICD-10-CM

## 2014-06-02 DIAGNOSIS — J301 Allergic rhinitis due to pollen: Secondary | ICD-10-CM

## 2014-06-02 DIAGNOSIS — B2790 Infectious mononucleosis, unspecified without complication: Secondary | ICD-10-CM | POA: Diagnosis not present

## 2014-06-02 DIAGNOSIS — R0982 Postnasal drip: Secondary | ICD-10-CM | POA: Diagnosis not present

## 2014-06-02 LAB — POCT RAPID STREP A (OFFICE): RAPID STREP A SCREEN: NEGATIVE

## 2014-06-02 LAB — POCT MONO (EPSTEIN BARR VIRUS): MONO, POC: POSITIVE — AB

## 2014-06-02 MED ORDER — LIDOCAINE VISCOUS 2 % MT SOLN: 20.0000 mL | OROMUCOSAL | Status: DC | PRN

## 2014-06-02 MED ORDER — BECLOMETHASONE DIPROPIONATE 80 MCG/ACT NA AERS: 1.0000 | INHALATION_SPRAY | Freq: Two times a day (BID) | NASAL | Status: DC

## 2014-06-02 NOTE — Telephone Encounter (Signed)
Call and let her know that mono test was actually +.  I believe however that she has likely been + back in prior month.  It was faintly positive on the test.   I think her current symptoms are more related to a combination of both mono infection and allergies and post nasal drainage.     Treatment recommendation:  Drink plenty of water daily  Avoid strenuous activity another few weeks until feeling back to normal  No contact sports such as martial arts, basketball, football, wrestling, etc , and avoid injury to the abdomen in the event of spleen enlargement for 6 weeks  She is technically contagious for up to 6 months.  She is more contagious now, and the likelihood of passing this to someone decreases over time.  However, avoid kissing, sharing drinks, given the transmission in saliva/bodily fluids.   Continue the current allergy medications  Use the other recommendations, Tylenol or Ibuprofen for fever, sore throat, aches  Use salt water gargles, warm fluids, and sore throat spray for sore throat pain  Use the allegra and new nasal spray for allergies  Return if feeling much worse, if bad cough, or if severe fatigue  Symptoms gradually resolve over time.

## 2014-06-02 NOTE — Addendum Note (Signed)
Addended by: Armanda Magic on: 06/02/2014 10:46 AM   Modules accepted: Orders

## 2014-06-02 NOTE — Progress Notes (Signed)
Subjective:  Lauren Leonard is a 24 y.o. female who presents for evaluation of sore throat.  She has not had a recent close exposure to someone with proven streptococcal pharyngitis.  Associated symptoms include 1 day hx/o sore throat, ear pressure ,but no fever, cough, NVD, headache, swollen nodes.  She does have painful sore throat, post nasal drainage, sneezing. Just recently switched to allergra QHS, using Flonase nasal ongoing.   Gets spring time allergies.  She also has been seen here for throat pain and swelling a few different times in the last 3 months.  No sick contacts.  No other aggravating or relieving factors.  No other c/o.  The following portions of the patient's history were reviewed and updated as appropriate: allergies, current medications, past medical history, past social history, past surgical history and problem list.  ROS as in subjective   Objective: Filed Vitals:   06/02/14 0925  BP: 100/60  Pulse: 76  Temp: 98.1 F (36.7 C)  Resp: 16    General appearance: no distress, WD/WN,mildly ill-appearing HEENT: normocephalic, conjunctiva/corneas normal, sclerae anicteric, nares patent, no discharge or erythema, pharynx with erythema, no exudate.  Oral cavity: MMM, no lesions  Neck: tender but supple, no lymphadenopathy, no thyromegaly Heart: RRR, normal S1, S2, no murmurs Lungs: CTA bilaterally, no wheezes, rhonchi, or rales Abdomen: +bs, soft, non tender, non distended, no masses, no hepatomegaly, no splenomegaly   Laboratory Strep test done. Results:negative.   Mono test positive     Assessment and Plan: Encounter Diagnoses  Name Primary?  . Sore throat   . Allergic rhinitis due to pollen Yes  . Post-nasal drip   . Mononucleosis     Advised that symptoms and exam suggest both mono and post nasal drip/allergies etiology.  Discussed symptomatic treatment including salt water gargles, warm fluids, rest, hydrate well, can use over-the-counter Tylenol or  Ibuprofen for throat pain, fever, or malaise.   Discussed diagnosis of mono, precautions, treatment recommendations.   Discussed usual time frame for symptoms to resolve.  Return prn.

## 2014-06-02 NOTE — Telephone Encounter (Signed)
I spoke with the patient making her aware of her lab results and all of [name] PA recommendations for her care Patient understood all recommendations

## 2014-06-05 ENCOUNTER — Encounter: Payer: Self-pay | Admitting: Medical

## 2014-06-05 ENCOUNTER — Ambulatory Visit (INDEPENDENT_AMBULATORY_CARE_PROVIDER_SITE_OTHER): Payer: BC Managed Care – PPO | Admitting: Medical

## 2014-06-05 VITALS — BP 100/58 | HR 68 | Temp 98.4°F | Resp 15 | Wt 194.0 lb

## 2014-06-05 DIAGNOSIS — J309 Allergic rhinitis, unspecified: Secondary | ICD-10-CM | POA: Diagnosis not present

## 2014-06-05 DIAGNOSIS — K5900 Constipation, unspecified: Secondary | ICD-10-CM

## 2014-06-05 DIAGNOSIS — J4530 Mild persistent asthma, uncomplicated: Secondary | ICD-10-CM | POA: Diagnosis not present

## 2014-06-05 DIAGNOSIS — R05 Cough: Secondary | ICD-10-CM

## 2014-06-05 DIAGNOSIS — B2790 Infectious mononucleosis, unspecified without complication: Secondary | ICD-10-CM

## 2014-06-05 DIAGNOSIS — R490 Dysphonia: Secondary | ICD-10-CM

## 2014-06-05 DIAGNOSIS — R1031 Right lower quadrant pain: Secondary | ICD-10-CM | POA: Diagnosis not present

## 2014-06-05 DIAGNOSIS — R059 Cough, unspecified: Secondary | ICD-10-CM

## 2014-06-05 DIAGNOSIS — R112 Nausea with vomiting, unspecified: Secondary | ICD-10-CM | POA: Diagnosis not present

## 2014-06-05 LAB — CBC
HCT: 38.6 % (ref 36.0–46.0)
HEMOGLOBIN: 12.4 g/dL (ref 12.0–15.0)
MCH: 29.5 pg (ref 26.0–34.0)
MCHC: 32 g/dL (ref 30.0–36.0)
MCV: 91.6 fL (ref 78.0–100.0)
PLATELETS: 248 10*3/uL (ref 150–400)
RBC: 4.21 MIL/uL (ref 3.87–5.11)
RDW: 11.9 % (ref 11.5–15.5)
WBC: 6.6 10*3/uL (ref 4.0–10.5)

## 2014-06-05 LAB — POCT URINALYSIS DIPSTICK
BILIRUBIN UA: NEGATIVE
Blood, UA: NEGATIVE
GLUCOSE UA: NEGATIVE
Ketones, UA: NEGATIVE
LEUKOCYTES UA: NEGATIVE
Nitrite, UA: NEGATIVE
Protein, UA: NEGATIVE
Spec Grav, UA: 1.025
UROBILINOGEN UA: NEGATIVE
pH, UA: 7

## 2014-06-05 MED ORDER — ONDANSETRON 4 MG PO TBDP: 4.0000 mg | ORAL_TABLET | Freq: Three times a day (TID) | ORAL | Status: DC | PRN

## 2014-06-05 MED ORDER — AZITHROMYCIN 250 MG PO TABS
ORAL_TABLET | ORAL | Status: DC
Start: 2014-06-05 — End: 2014-07-02

## 2014-06-05 MED ORDER — ALBUTEROL SULFATE HFA 108 (90 BASE) MCG/ACT IN AERS: 2.0000 | INHALATION_SPRAY | Freq: Four times a day (QID) | RESPIRATORY_TRACT | Status: DC | PRN

## 2014-06-05 MED ORDER — HYDROCODONE-ACETAMINOPHEN 5-325 MG PO TABS: ORAL_TABLET | ORAL | Status: DC

## 2014-06-05 NOTE — Patient Instructions (Signed)
Recommendations:  continue your normal allergy medications, allegra, Qnasal spray  STOP Asmanex temporarily  continue Albuterol rescue inhaler as needed for wheezing, cough, and shortness of breath  Begin Hydrocodone tablets every 6 hours as needed for cough suppression  Begin Linzess daily for constipation  Hydrate well with water  Rest  Begin zpak for possible early pneumonia  We will call with lab results  Use zofran as needed every 6 hours for nausea  If you have worse right lower abdominal pain over the weekend and/or rigid abdomen, nausea, fever, then go to the emergency department

## 2014-06-05 NOTE — Progress Notes (Signed)
Subjective: Here for several concerns.    I saw her earlier in the week for mono.  Since earlier in the week, chest feels heavy, voice hoarse, keep spitting up phlegm, hurts to cough.  Has dark yellow sputum.Marland Kitchen  Has a lot of cough.  No fever, no wheezing, but is using her asthma and allergy medication daily, just started the Qnasal from last visit.  Awoke with piercing pain in RLQ area Wednesday morning.  Feels constipated, has had BM 2 times this week.   Using nothing for constipation.   Trying to limit fast food, fried foods and soda.  Has had nausea on and off.   Has used medication in high school in the past for this.  Drinks 2 bottles of water daily.  Drinks tea.  Tries to eat greens daily.  Eats oatmeal in the morning.  Eats fruit smoothies at time. No other aggravating or relieving factors. No other complaint.   Past Medical History  Diagnosis Date  . Asthma   . Allergy     sinus    Past Surgical History  Procedure Laterality Date  . Cholecystectomy  06/28/12    lap chole   ROS as in subjective    Objective: BP 100/58 mmHg  Pulse 68  Temp(Src) 98.4 F (36.9 C) (Oral)  Resp 15  Wt 194 lb (87.998 kg)  SpO2 97%  General appearance: alert, no distress, WD/WN HEENT: normocephalic, sclerae anicteric, TMs pearly, nares patent, no discharge or erythema, pharynx normal Oral cavity: MMM, no lesions Neck: supple, no lymphadenopathy, no thyromegaly, no masses Heart: RRR, normal S1, S2, no murmurs Lungs: CTA bilaterally, no wheezes, rhonchi, or rales Abdomen: +bs, soft,+RLQ tenderness, but no guarding or rebound, mild epigastric tenderness, non distended, no masses, no hepatomegaly, no splenomegaly Pulses: 2+ symmetric, upper and lower extremities, normal cap refill Ext: no edema Back: nontender    Assessment: Encounter Diagnoses  Name Primary?  . Infectious mononucleosis Yes  . Cough   . Hoarseness   . Asthma, mild persistent, uncomplicated   . Allergic rhinitis,  unspecified allergic rhinitis type   . Right lower quadrant abdominal pain   . Constipation, unspecified constipation type     Plan: Mono - discussed her concerns, c/t supportive care Cough, hoarsensness - begin zpak for possible early pneumonia given mono and worsening URI symptoms.  She declines CXR today.    Asthma - stop Asmanaex for now in case it is causing more problems then not.  C/t albuterol prn.   allergic rhinitis - c/t Allegra, qnasal which she started this week RLQ - discussed her symptoms and exam.  CBC today.  If worse  constipation - discussed water and fiber intake.  Begin samples of Linzess.   F/u pending lab

## 2014-06-05 NOTE — Addendum Note (Signed)
Addended by: Armanda Magic on: 06/05/2014 04:41 PM   Modules accepted: Orders

## 2014-06-10 ENCOUNTER — Other Ambulatory Visit: Payer: Self-pay | Admitting: Family Medicine

## 2014-06-10 DIAGNOSIS — B2790 Infectious mononucleosis, unspecified without complication: Secondary | ICD-10-CM

## 2014-06-11 ENCOUNTER — Other Ambulatory Visit: Payer: BC Managed Care – PPO

## 2014-06-11 ENCOUNTER — Encounter: Payer: Self-pay | Admitting: Family Medicine

## 2014-06-11 ENCOUNTER — Telehealth: Payer: Self-pay | Admitting: Family Medicine

## 2014-06-11 DIAGNOSIS — B2790 Infectious mononucleosis, unspecified without complication: Secondary | ICD-10-CM

## 2014-06-11 NOTE — Telephone Encounter (Signed)
Pt came in for labs and requested referral to ENT

## 2014-06-11 NOTE — Telephone Encounter (Signed)
Ok, refer

## 2014-06-11 NOTE — Telephone Encounter (Signed)
Scheduled appointment with ENT Dr. Lucia Gaskins 06/18/14 @ 1:15pm, patient aware.

## 2014-06-12 LAB — EPSTEIN-BARR VIRUS VCA ANTIBODY PANEL
EBV EA IgG: <5 U/mL (ref <9.0)
EBV NA IgG: 108 U/mL — ABNORMAL HIGH (ref <18.0)
EBV VCA IGM: 10.6 U/mL (ref <36.0)
EBV VCA IgG: 113 U/mL — ABNORMAL HIGH (ref <18.0)

## 2014-06-13 LAB — TSH: TSH: 0.613 u[IU]/mL (ref 0.350–4.500)

## 2014-06-17 ENCOUNTER — Telehealth: Payer: Self-pay | Admitting: Medical

## 2014-06-17 NOTE — Telephone Encounter (Signed)
Recv'd refill request for KGALB topical pain cream, form put in your folder from

## 2014-06-24 NOTE — Telephone Encounter (Signed)
What is this for ,what condition, and has she used it before?

## 2014-06-25 NOTE — Telephone Encounter (Signed)
LM to CB [facility] 

## 2014-06-25 NOTE — Telephone Encounter (Signed)
Patient says this is not hers.

## 2014-07-02 ENCOUNTER — Encounter: Payer: Self-pay | Admitting: Diagnostic Neuroimaging

## 2014-07-02 ENCOUNTER — Ambulatory Visit (INDEPENDENT_AMBULATORY_CARE_PROVIDER_SITE_OTHER): Payer: BC Managed Care – PPO | Admitting: Diagnostic Neuroimaging

## 2014-07-02 VITALS — BP 105/70 | HR 76 | Ht 66.0 in | Wt 188.6 lb

## 2014-07-02 DIAGNOSIS — Q046 Congenital cerebral cysts: Secondary | ICD-10-CM

## 2014-07-02 DIAGNOSIS — G43009 Migraine without aura, not intractable, without status migrainosus: Secondary | ICD-10-CM | POA: Diagnosis not present

## 2014-07-02 MED ORDER — TOPIRAMATE 50 MG PO TABS: 50.0000 mg | ORAL_TABLET | Freq: Two times a day (BID) | ORAL | Status: DC

## 2014-07-02 NOTE — Progress Notes (Signed)
[facility]  PATIENT: Lauren Leonard DOB: 1990/05/23  REFERRING CLINICIAN:  HISTORY FROM: patient  REASON FOR VISIT: follow up (Dr. Janann Colonel transfer)   HISTORICAL  CHIEF COMPLAINT:  Chief Complaint  Patient presents with  . Follow-up    headache     HISTORY OF PRESENT ILLNESS:   UPDATE 4/114/16: Since last visit, HA have been up and down. MRA head had shown possible cyst at left foramen of monro. 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 Was severe in Jan 2016, then better in Feb-Mar. Now increase HA in last month. Thorbbing, severe, needle HA with nausea, photophobia. Some blurred vision at onset. No scintillating scotoma or colors/sparks. Has seen NSGY (Dr. Kathyrn Sheriff) who recommended conservative mgmt and monitoring; repeat scan and visit with him in June 2016.   PRIOR HPI (08/29/13, [place]): 24 y.o. female here as a referral from Dr. Glade Lloyd for headache evaluation. Started in February, getting progressively worse. Currently occuring every day, can last 20 minutes to hours. Comes on quickly. No aura. Predominantly on top of her head, described as sharp pounding pain. At worst will get to a 8/10. + Photophobia, notes some nausea. No focal motor or sensory changes. Notes some blurry vision with the headache but otherwise normal. No transient obscurations. Notes some dizziness and light headed sensation when bending over. No known triggers Headaches started during finals but has continued. No history of head trauma. Notes 30lb weight gain in the past month. Has difficulty falling asleep. One cup of coffee a day. Has strong maternal history of brain aneurysms. Mother has migraines.    REVIEW OF SYSTEMS: Full 14 system review of systems performed and notable only for easy bruising appetite change fatigue cough wheezing shortness of breath insomnia freq waking nervous anxious memory loss headache constipation abd pain.  ALLERGIES: Allergies  Allergen Reactions  .  Latex Other (See Comments)  . Citric Acid Rash    Breakouts on chest and back.    HOME MEDICATIONS: Outpatient Prescriptions Prior to Visit  Medication Sig Dispense Refill  . albuterol (PROVENTIL HFA;VENTOLIN HFA) 108 (90 BASE) MCG/ACT inhaler Inhale 2 puffs into the lungs every 6 (six) hours as needed for wheezing or shortness of breath. 1 Inhaler 0  . diphenhydrAMINE (BENADRYL) 25 MG tablet Take 25 mg by mouth every 6 (six) hours as needed.    . mometasone (ASMANEX) 220 MCG/INH inhaler Inhale 2 puffs into the lungs daily.    Marland Kitchen azithromycin (ZITHROMAX) 250 MG tablet 2 tablets day 1, then 1 tablet days 2-4 6 tablet 0  . Beclomethasone Dipropionate 80 MCG/ACT AERS Place 1 spray into the nose 2 (two) times daily. 8.7 g 3  . fexofenadine (ALLEGRA) 180 MG tablet Take 180 mg by mouth daily.    Marland Kitchen HYDROcodone-acetaminophen (NORCO/VICODIN) 5-325 MG per tablet 1 tablet po q6 hrs for cough 20 tablet 0  . lidocaine (XYLOCAINE) 2 % solution Use as directed 20 mLs in the mouth or throat as needed for mouth pain. (Patient not taking: Reported on 06/05/2014) 100 mL 1  . ondansetron (ZOFRAN ODT) 4 MG disintegrating tablet Take 1-2 tablets (4-8 mg total) by mouth every 8 (eight) hours as needed for nausea or vomiting. 20 tablet 0   No facility-administered medications prior to visit.    PAST MEDICAL HISTORY: Past Medical History  Diagnosis Date  . Asthma   . Allergy     sinus     PAST SURGICAL HISTORY: Past Surgical History  Procedure Laterality Date  . Cholecystectomy  06/28/12    lap chole    FAMILY HISTORY: Family History  Problem Relation Age of Onset  . Cancer Maternal Grandmother     breast  . Diabetes Maternal Grandmother   . Hypertension Maternal Grandmother   . Cancer Maternal Grandfather     panreatic  . Diabetes Maternal Grandfather   . Migraines Mother   . Cerebral aneurysm Other     SOCIAL HISTORY:  History   Social History  . Marital Status: Single    Spouse Name:  N/A  . Number of Children: N/A  . 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 Smoking status: Never Smoker   . Smokeless tobacco: Never Used  . Alcohol Use: Yes     Comment: social (a few times/year)  . Drug Use: No  . Sexual Activity: Not Currently   Other Topics Concern  . Not on file   Social History Narrative   No children   Patient writes with both hands, primarily uses left   Patient is in grad school   Drinks a cup of coffee a day         PHYSICAL EXAM  Filed Vitals:   07/02/14 1444  BP: 105/70  Pulse: 76  Height: 5\' 6"  (1.676 m)  Weight: 188 lb 9.6 oz (85.548 kg)    Body mass index is 30.46 kg/(m^2).  No exam data present  No flowsheet data found.  GENERAL EXAM: Patient is in no distress; well developed, nourished and groomed; neck is supple  CARDIOVASCULAR: Regular rate and rhythm, no murmurs, no carotid bruits  NEUROLOGIC: MENTAL STATUS: awake, alert, language fluent, comprehension intact, naming intact, fund of knowledge appropriate CRANIAL NERVE: no papilledema on fundoscopic exam, pupils equal and reactive to light, visual fields full to confrontation, extraocular muscles intact, no nystagmus, facial sensation and strength symmetric, hearing intact, palate elevates symmetrically, uvula midline, shoulder shrug symmetric, tongue midline. MOTOR: normal bulk and tone, full strength in the BUE, BLE SENSORY: normal and symmetric to light touch COORDINATION: finger-nose-finger, fine finger movements normal REFLEXES: deep tendon reflexes present and symmetric GAIT/STATION: narrow based gait; able to walk tandem; romberg is negative    DIAGNOSTIC DATA (LABS, IMAGING, TESTING) - I reviewed patient records, labs, notes, testing and imaging myself where available.  Lab Results  Component Value Date   WBC 6.6 06/05/2014   HGB 12.4 06/05/2014   HCT 38.6 06/05/2014   MCV 91.6 06/05/2014   PLT 248  06/05/2014      Component Value Date/Time   NA 139 04/22/2013 2100   K 4.3 04/22/2013 2100   CL 103 04/22/2013 2100   CO2 29 04/22/2013 2100   GLUCOSE 90 04/22/2013 2100   BUN 11 04/22/2013 2100   CREATININE 0.91 04/22/2013 2100   CREATININE 0.76 06/15/2012 1349   CALCIUM 9.7 04/22/2013 2100   PROT 7.1 06/15/2012 1349   ALBUMIN 3.8 06/15/2012 1349   AST 54* 06/15/2012 1349   ALT 42* 06/15/2012 1349   ALKPHOS 83 06/15/2012 1349   BILITOT 0.6 06/15/2012 1349   GFRNONAA >90 06/15/2012 1349   GFRAA >90 06/15/2012 1349   No results found for: CHOL, HDL, LDLCALC, LDLDIRECT, TRIG, CHOLHDL No results found for: HGBA1C No results found for: VITAMINB12 Lab Results  Component Value Date   TSH 0.613 06/11/2014    03/04/14 MRI brain (with and without) demonstrating: 1. There is a small calcified cyst with rim enhancement,  near the left foramen of monro, measuring 3x55mm. No associated hydrocephalus. 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 2. Remainder of brain parenchyma is normal.  02/26/14 MRA head (without) demonstrating: 1. Unremarkable intracranial vessels.  2. No evidence of cerebral aneurysm.  3. Small cystic lesion (71mm) near the left foramen of monro. 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 Recommend follow up with MRI brain (with and without).      ASSESSMENT AND PLAN  24 y.o. year old female here with new onset headaches since Feb 2015, with migraine features. MRA and then MRI showed small cyst, poss colloid, near left foramen of monro. Could be symptomatic. Undergoing monitoring per NSGY (Dr. Kathyrn Sheriff).  PLAN: - trial of TPX for migraine prevention - repeat scan and NSGY follow up in June 2016  Meds ordered this encounter  Medications  . topiramate (TOPAMAX) 50 MG tablet    Sig: Take 1 tablet (50 mg total) by mouth 2 (two) times daily.    Dispense:  60 tablet    Refill:  12   Return in about 3 months (around 10/01/2014).    Penni Bombard, MD  07/11/5359, 4:43 PM Certified in Neurology, Neurophysiology and Neuroimaging  [facility] [address] [place], [place] [address] [phone]

## 2014-07-02 NOTE — Patient Instructions (Signed)
Start topiramate 50mg  at bedtime x 1-2 weeks. Then increase to twice a day.  Drink plenty of water with topiramate.

## 2014-07-06 ENCOUNTER — Telehealth: Payer: Self-pay | Admitting: Diagnostic Neuroimaging
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
## 2014-07-07 ENCOUNTER — Other Ambulatory Visit: Payer: Self-pay | Admitting: *Deleted

## 2014-07-07 ENCOUNTER — Encounter: Payer: Self-pay | Admitting: *Deleted

## 2014-07-07 DIAGNOSIS — G43009 Migraine without aura, not intractable, without status migrainosus: Secondary | ICD-10-CM

## 2014-07-07 NOTE — Telephone Encounter (Signed)
Talked with Dr. Leta Baptist about the pts concerns. Called and spoke with the pt on the phone explaining that Dr. Leta Baptist wanted to order an EEG to rule out seizures. He also wanted the pt to follow up with PCP to make sure her blacking out episode was not related to dehydration or a cardiac issue. I told her she would need to follow up with [place] in a few weeks after the EEG. I also wrote her a work note and she asked me to mail it to her home address. She stated a thanks and an understanding and I asked her to call back with any further questions or issues

## 2014-07-11 NOTE — Consult Note (Signed)
Referral Information:  Reason for Referral Prepregnancy Consult - Concerns about 2 spontaneous abortions   Referring Physician [facility]   Prenatal Hx Not pregnant - patient is planning pregnancy in next 2 years   Past Obstetrical Hx G1 - 2012: First trimester spontaneous abortion. Patient was diagnosed with the spontaneous abortion after she was seen for vaginal infection. G2 - Jan 2014: Spontaneous abortion at "3 months". Patient unsure if the pregnancy entered the second trimester. She had ultrasound demonstrating fetal heart beat. Patient had several days of pain before loss occured. She required a D&C afterwards.   Home Medications: Medication Instructions Status  amoxicillin 250 mg oral capsule 1 cap(s) orally 2 times a day Active  ZyrTEC 10 mg oral tablet 1 tab(s) orally once a day Active  Vitamin C 250 mg oral tablet 1 tab(s) orally once a day Active   Vital Signs/Notes:  Nursing Vital Signs: **Vital Signs.:   02-Mar-15 14:02  Vital Signs Type Routine  Temperature Temperature (F) 96.3  Celsius 35.7  Pulse Pulse 71  Respirations Respirations 18  Systolic BP Systolic BP 034  Diastolic BP (mmHg) Diastolic BP (mmHg) 63  Mean BP 80  Pulse Ox % Pulse Ox % 100  Oxygen Delivery Room Air/ 21 %   Perinatal Consult:  Past Medical History cont'd Gyn History Menses 11/28-32/4-5: Gonorrhea - 2010; HPV on Pap 2013; No other gyn procedures or issures  Past Medical History Surgery - laparoscopic cholecystectomy 2014 Illness - mild intermittent asthma since middle school; headaches Medications  Albuterol prn  Zyrtek for environmental alllergies  Tramadol prn headaches NKDA Immunizations - No influenza vaccine this year; otherwise UTD  Social History Smoking / Drugs - negative; EtOH - social; Occupation - Advertising account planner; Hydrographic surveyor - in [employer]; Single  Family History Mother - hypertension / asthma / multiple spontaneous abortions / 1 fetal death / 1 preterm delivery  (child died at age 97 weeks) Father - "chemical imbalance"  ROS - All other systems negative to review   Impression/Recommendations:  Impression 1) Patient planning pregnancy 2) Two spontaneous abortion - possible 1 second trimester loss   Recommendations 1) Patient start taking multivitamin with folic acid 2) Screen for APAS (for possible second trimester loss)    Total Time Spent with Patient 30 minutes   >50% of visit spent in couseling/coordination of care yes   Office Use Only [id number]  Level 2 (63min) NEW office consult exp prob focused   Coding Description: OTHER: History of 2 spontaneous abortions.  Electronic Signatures: Sande Rives (MD)  (Signed 02-Mar-15 14:50)  Authored: Referral, Home Medications, Vital Signs/Notes, Consult, Impression, Billing, Coding Description   Last Updated: 02-Mar-15 14:50 by Sande Rives (MD)

## 2014-07-14 ENCOUNTER — Ambulatory Visit (INDEPENDENT_AMBULATORY_CARE_PROVIDER_SITE_OTHER): Payer: BC Managed Care – PPO | Admitting: Diagnostic Neuroimaging

## 2014-07-14 DIAGNOSIS — G43009 Migraine without aura, not intractable, without status migrainosus: Secondary | ICD-10-CM

## 2014-07-16 ENCOUNTER — Telehealth: Payer: Self-pay | Admitting: Diagnostic Neuroimaging

## 2014-07-16 NOTE — Telephone Encounter (Signed)
Patient is calling to get EEG results. °
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
## 2014-07-21 ENCOUNTER — Other Ambulatory Visit: Payer: Self-pay | Admitting: Diagnostic Neuroimaging

## 2014-07-21 MED ORDER — TOPIRAMATE 100 MG PO TABS: 100.0000 mg | ORAL_TABLET | Freq: Two times a day (BID) | ORAL | Status: DC

## 2014-07-23 ENCOUNTER — Telehealth: Payer: Self-pay

## 2014-07-23 NOTE — Telephone Encounter (Signed)
pls refer, note she has already seen neuro

## 2014-07-23 NOTE — Telephone Encounter (Signed)
Patient called, moved to [facility], needs to see Neurologist there for her headaches, but she needs referral from her PCP, is it okay to send referral? Needs to be faxed to [phone]

## 2014-07-27 ENCOUNTER — Telehealth: Payer: Self-pay | Admitting: *Deleted

## 2014-07-27 NOTE — Telephone Encounter (Signed)
Patient records faxed to [facility] on 07/27/14.

## 2014-07-28 NOTE — Telephone Encounter (Signed)
I fax over the referral for the patient to [facility] Fax # [phone] in [place], [place]. Patient has moved and needs to see the doctor there for her HA.

## 2014-08-06 ENCOUNTER — Other Ambulatory Visit: Payer: Self-pay | Admitting: Medical

## 2014-08-28 DIAGNOSIS — R87619 Unspecified abnormal cytological findings in specimens from cervix uteri: Secondary | ICD-10-CM | POA: Insufficient documentation

## 2014-08-28 DIAGNOSIS — A749 Chlamydial infection, unspecified: Secondary | ICD-10-CM | POA: Insufficient documentation

## 2014-08-28 DIAGNOSIS — J45909 Unspecified asthma, uncomplicated: Secondary | ICD-10-CM | POA: Insufficient documentation

## 2014-10-07 ENCOUNTER — Ambulatory Visit: Payer: BC Managed Care – PPO | Admitting: Diagnostic Neuroimaging

## 2015-06-02 ENCOUNTER — Emergency Department (HOSPITAL_COMMUNITY): Payer: 59

## 2015-06-02 ENCOUNTER — Emergency Department (HOSPITAL_COMMUNITY)
Admission: EM | Admit: 2015-06-02 | Discharge: 2015-06-02 | Disposition: A | Discharge status: Home / Self Care | Payer: 59 | Attending: Emergency Medicine | Admitting: Emergency Medicine

## 2015-06-02 ENCOUNTER — Encounter (HOSPITAL_COMMUNITY): Payer: Self-pay | Admitting: Family Medicine

## 2015-06-02 DIAGNOSIS — Z7951 Long term (current) use of inhaled steroids: Secondary | ICD-10-CM | POA: Insufficient documentation

## 2015-06-02 DIAGNOSIS — R112 Nausea with vomiting, unspecified: Secondary | ICD-10-CM | POA: Insufficient documentation

## 2015-06-02 DIAGNOSIS — R1084 Generalized abdominal pain: Secondary | ICD-10-CM | POA: Diagnosis not present

## 2015-06-02 DIAGNOSIS — R6883 Chills (without fever): Secondary | ICD-10-CM | POA: Insufficient documentation

## 2015-06-02 DIAGNOSIS — J45909 Unspecified asthma, uncomplicated: Secondary | ICD-10-CM | POA: Insufficient documentation

## 2015-06-02 DIAGNOSIS — Z3202 Encounter for pregnancy test, result negative: Secondary | ICD-10-CM | POA: Diagnosis not present

## 2015-06-02 DIAGNOSIS — R1013 Epigastric pain: Secondary | ICD-10-CM | POA: Diagnosis present

## 2015-06-02 DIAGNOSIS — R630 Anorexia: Secondary | ICD-10-CM | POA: Diagnosis not present

## 2015-06-02 DIAGNOSIS — Z91040 Latex allergy status: Secondary | ICD-10-CM | POA: Diagnosis not present

## 2015-06-02 DIAGNOSIS — Z79899 Other long term (current) drug therapy: Secondary | ICD-10-CM | POA: Diagnosis not present

## 2015-06-02 DIAGNOSIS — R05 Cough: Secondary | ICD-10-CM | POA: Diagnosis not present

## 2015-06-02 DIAGNOSIS — R1110 Vomiting, unspecified: Secondary | ICD-10-CM

## 2015-06-02 LAB — CBC
HEMATOCRIT: 43.5 % (ref 36.0–46.0)
HEMOGLOBIN: 14.8 g/dL (ref 12.0–15.0)
MCH: 31.9 pg (ref 26.0–34.0)
MCHC: 34 g/dL (ref 30.0–36.0)
MCV: 93.8 fL (ref 78.0–100.0)
PLATELETS: 268 10*3/uL (ref 150–400)
RBC: 4.64 MIL/uL (ref 3.87–5.11)
RDW: 12.2 % (ref 11.5–15.5)
WBC: 11.4 10*3/uL — AB (ref 4.0–10.5)

## 2015-06-02 LAB — COMPREHENSIVE METABOLIC PANEL
ALBUMIN: 4.3 g/dL (ref 3.5–5.0)
ALT: 35 U/L (ref 14–54)
ANION GAP: 10 (ref 5–15)
AST: 55 U/L — AB (ref 15–41)
Alkaline Phosphatase: 73 U/L (ref 38–126)
BUN: 11 mg/dL (ref 6–20)
CHLORIDE: 111 mmol/L (ref 101–111)
CO2: 21 mmol/L — ABNORMAL LOW (ref 22–32)
Calcium: 9.4 mg/dL (ref 8.9–10.3)
Creatinine, Ser: 0.99 mg/dL (ref 0.44–1.00)
GFR calc Af Amer: >60 mL/min (ref >60)
GLUCOSE: 96 mg/dL (ref 65–99)
POTASSIUM: 4.1 mmol/L (ref 3.5–5.1)
Sodium: 142 mmol/L (ref 135–145)
Total Bilirubin: 0.7 mg/dL (ref 0.3–1.2)
Total Protein: 7.6 g/dL (ref 6.5–8.1)

## 2015-06-02 LAB — URINALYSIS, ROUTINE W REFLEX MICROSCOPIC
Bilirubin Urine: NEGATIVE
GLUCOSE, UA: NEGATIVE mg/dL
HGB URINE DIPSTICK: NEGATIVE
KETONES UR: NEGATIVE mg/dL
LEUKOCYTES UA: NEGATIVE
Nitrite: NEGATIVE
PH: 7 (ref 5.0–8.0)
PROTEIN: NEGATIVE mg/dL
Specific Gravity, Urine: 1.022 (ref 1.005–1.030)

## 2015-06-02 LAB — I-STAT BETA HCG BLOOD, ED (MC, WL, AP ONLY): I-stat hCG, quantitative: <5 m[IU]/mL (ref <5)

## 2015-06-02 LAB — RAPID STREP SCREEN (MED CTR MEBANE ONLY): STREPTOCOCCUS, GROUP A SCREEN (DIRECT): NEGATIVE

## 2015-06-02 LAB — LIPASE, BLOOD: LIPASE: 33 U/L (ref 11–51)

## 2015-06-02 MED ORDER — ONDANSETRON HCL 4 MG/2ML IJ SOLN
4.0000 mg | Freq: Once | INTRAMUSCULAR | Status: AC
  Administered 2015-06-02: 4 mg via INTRAVENOUS
  Filled 2015-06-02: qty 2

## 2015-06-02 MED ORDER — ONDANSETRON HCL 4 MG PO TABS: 4.0000 mg | ORAL_TABLET | Freq: Four times a day (QID) | ORAL | Status: DC

## 2015-06-02 MED ORDER — HYDROCODONE-ACETAMINOPHEN 5-325 MG PO TABS: 2.0000 | ORAL_TABLET | ORAL | Status: DC | PRN

## 2015-06-02 MED ORDER — SODIUM CHLORIDE 0.9 % IV BOLUS (SEPSIS)
1000.0000 mL | Freq: Once | INTRAVENOUS | Status: AC
  Administered 2015-06-02: 1000 mL via INTRAVENOUS

## 2015-06-02 MED ORDER — MORPHINE SULFATE (PF) 4 MG/ML IV SOLN
4.0000 mg | Freq: Once | INTRAVENOUS | Status: AC
  Administered 2015-06-02: 4 mg via INTRAVENOUS
  Filled 2015-06-02: qty 1

## 2015-06-02 MED ORDER — IOHEXOL 300 MG/ML  SOLN
100.0000 mL | Freq: Once | INTRAMUSCULAR | Status: AC | PRN
  Administered 2015-06-02: 100 mL via INTRAVENOUS

## 2015-06-02 NOTE — Discharge Instructions (Signed)

## 2015-06-02 NOTE — ED Notes (Signed)
Pt here for abd pain and vomiting that started this am. sts that she took some amoxicillin over the weekend for sore throat.

## 2015-06-02 NOTE — ED Notes (Signed)
Patient tolerated PO challenge well.  

## 2015-06-02 NOTE — ED Provider Notes (Addendum)
CSN: [id number]     Arrival date & time 06/02/15  [id number] History   First MD Initiated Contact with Patient 06/02/15 [phone]     Chief Complaint  Patient presents with  . Abdominal Pain  . Emesis     (Consider location/radiation/quality/duration/timing/severity/associated sxs/prior Treatment) Patient is a 25 y.o. female presenting with abdominal pain and vomiting. The history is provided by the patient.  Abdominal Pain Pain location:  Epigastric Pain quality: cramping, gnawing, sharp and stabbing   Pain radiates to:  Does not radiate Pain severity:  Severe Onset quality: woke up at 5am with sx. Duration:  4 hours Timing:  Constant Progression:  Improving Chronicity:  New Context: awakening from sleep and recent illness   Context: not alcohol use, not medication withdrawal, not previous surgeries and not suspicious food intake   Context comment:  Pt states that sunday developed cough, congestion, sorethroat and started taking amoxicillin.  this morning abruptly woke with abd pain which is new Relieved by:  Vomiting Worsened by:  Nothing tried Associated symptoms: anorexia, chills, cough, nausea and vomiting   Associated symptoms: no diarrhea, no dysuria, no fever, no shortness of breath and no vaginal bleeding   Risk factors: no alcohol abuse, has not had multiple surgeries, no NSAID use and not pregnant   Emesis Associated symptoms: abdominal pain and chills   Associated symptoms: no diarrhea     Past Medical History  Diagnosis Date  . Asthma   . Allergy     sinus    Past Surgical History  Procedure Laterality Date  . Cholecystectomy  06/28/12    lap chole   Family History  Problem Relation Age of Onset  . Cancer Maternal Grandmother     breast  . Diabetes Maternal Grandmother   . Hypertension Maternal Grandmother   . Cancer Maternal Grandfather     panreatic  . Diabetes Maternal Grandfather   . Migraines Mother   . Cerebral aneurysm Other    Social History   Substance Use Topics  . Smoking status: Never Smoker   . Smokeless tobacco: Never Used  . Alcohol Use: Yes     Comment: social (a few times/year)   OB History    Gravida Para Term Preterm AB TAB SAB Ectopic Multiple Living   1              Review of Systems  Constitutional: Positive for chills. Negative for fever.  Respiratory: Positive for cough. Negative for shortness of breath.   Gastrointestinal: Positive for nausea, vomiting, abdominal pain and anorexia. Negative for diarrhea.  Genitourinary: Negative for dysuria and vaginal bleeding.  All other systems reviewed and are negative.     Allergies  Latex and Citric acid  Home Medications   Prior to Admission medications   Medication Sig Start Date End Date Taking? Authorizing Provider  albuterol (PROVENTIL HFA;VENTOLIN HFA) 108 (90 BASE) MCG/ACT inhaler Inhale 2 puffs into the lungs every 6 (six) hours as needed for wheezing or shortness of breath. 06/05/14   Camelia Eng Tysinger, PA-C  ALLEGRA-D ALLERGY & CONGESTION 180-240 MG per 24 hr tablet Take 1 tablet by mouth daily. 06/19/14   Historical Provider, MD  diphenhydrAMINE (BENADRYL) 25 MG tablet Take 25 mg by mouth every 6 (six) hours as needed.    Historical Provider, MD  fluticasone Asencion Islam) 50 MCG/ACT nasal spray  05/23/14   Historical Provider, MD  fluticasone (FLONASE) 50 MCG/ACT nasal spray PLACE 2 SPRAYS INTO BOTH NOSTRILS DAILY. 08/06/14   Shanon Brow  S Tysinger, PA-C  mometasone (ASMANEX) 220 MCG/INH inhaler Inhale 2 puffs into the lungs daily.    Historical Provider, MD  topiramate (TOPAMAX) 100 MG tablet Take 1 tablet (100 mg total) by mouth 2 (two) times daily. 07/21/14   Earlean Polka Penumalli, MD   BP 103/59 mmHg  Pulse 76  Temp(Src) 97.5 F (36.4 C) (Oral)  Resp 18  SpO2 100%  LMP 05/30/2015 Physical Exam  Constitutional: She is oriented to person, place, and time. She appears well-developed and well-nourished. No distress.  HENT:  Head: Normocephalic and atraumatic.   Mouth/Throat: Mucous membranes are dry. Posterior oropharyngeal erythema present. No oropharyngeal exudate or posterior oropharyngeal edema.  Eyes: Conjunctivae and EOM are normal. Pupils are equal, round, and reactive to light.  Neck: Normal range of motion. Neck supple.  Cardiovascular: Normal rate, regular rhythm and intact distal pulses.   No murmur heard. Pulmonary/Chest: Effort normal and breath sounds normal. No respiratory distress. She has no wheezes. She has no rales.  Abdominal: Soft. She exhibits no distension. There is tenderness in the right upper quadrant, right lower quadrant, epigastric area and periumbilical area. There is guarding. There is no rebound and no CVA tenderness.  Musculoskeletal: Normal range of motion. She exhibits no edema or tenderness.  Neurological: She is alert and oriented to person, place, and time.  Skin: Skin is warm and dry. No rash noted. No erythema.  Psychiatric: She has a normal mood and affect. Her behavior is normal.  Nursing note and vitals reviewed.   ED Course  Procedures (including critical care time) Labs Review Labs Reviewed  COMPREHENSIVE METABOLIC PANEL - Abnormal; Notable for the following:    CO2 21 (*)    AST 55 (*)    All other components within normal limits  CBC - Abnormal; Notable for the following:    WBC 11.4 (*)    All other components within normal limits  URINALYSIS, ROUTINE W REFLEX MICROSCOPIC (NOT AT [facility]) - Abnormal; Notable for the following:    APPearance HAZY (*)    All other components within normal limits  RAPID STREP SCREEN (NOT AT [facility])  CULTURE, GROUP A STREP ([place])  LIPASE, BLOOD  I-STAT BETA HCG BLOOD, ED ([facility], [facility], [facility] ONLY)    Imaging Review Ct Abdomen Pelvis W Contrast  06/02/2015  CLINICAL DATA:  Epigastric pain and vomiting since this morning. EXAM: CT ABDOMEN AND PELVIS WITH CONTRAST TECHNIQUE: Multidetector CT imaging of the abdomen and pelvis was performed using the standard protocol following  bolus administration of intravenous contrast. CONTRAST:  127mL OMNIPAQUE IOHEXOL 300 MG/ML  SOLN COMPARISON:  Ultrasound from 06/15/2012. FINDINGS: Lower chest:  Unremarkable. Hepatobiliary: No focal abnormality within the liver parenchyma. Gallbladder surgically absent. No intrahepatic or extrahepatic biliary dilation. Pancreas: No focal mass lesion. No dilatation of the main duct. No intraparenchymal cyst. No peripancreatic edema. Spleen: No splenomegaly. No focal mass lesion. Adrenals/Urinary Tract: No adrenal nodule or mass. Kidneys have normal imaging features bilaterally. No evidence for hydroureter. The urinary bladder appears normal for the degree of distention. Stomach/Bowel: Stomach is nondistended. No gastric wall thickening. No evidence of outlet obstruction. Duodenum is normally positioned as is the ligament of Treitz. No small bowel wall thickening. No small bowel dilatation. The terminal ileum is normal. The appendix is normal. No gross colonic mass. No colonic wall thickening. No substantial diverticular change. Vascular/Lymphatic: No abdominal aortic aneurysm. No abdominal aortic atherosclerotic calcification. There is no gastrohepatic or hepatoduodenal ligament lymphadenopathy. No intraperitoneal or retroperitoneal lymphadenopathy. No  pelvic sidewall lymphadenopathy. Reproductive: The uterus has normal CT imaging appearance. There is no adnexal mass. Other: No intraperitoneal free fluid. Musculoskeletal: Bone windows reveal no worrisome lytic or sclerotic osseous lesions. IMPRESSION: Normal CT evaluation of the abdomen and pelvis. Specifically, no findings to explain the patient's history of epigastric pain and vomiting. Electronically Signed   By: Misty Stanley M.D.   On: 06/02/2015 12:17   I have personally reviewed and evaluated these images and lab results as part of my medical decision-making.   EKG Interpretation None      MDM   Final diagnoses:  Generalized abdominal pain   Intractable vomiting with nausea, vomiting of unspecified type    Pt presenting with severe epigastric pain that woke her this morning and vomiting x3.  Pt has had URI sx for the last 4 days with sore throat, congestion and cough and took left over amoxicillin for the last 3 days and was getting better till this morning.  She denies any vaginal/urinary sx.  No diarrhea.  No chest pain and states was having some wheezing this morning which has resolved.  Abd pain improved after vomiting.  VS wnl. Pt has mild erythema on throat today but exam also significant for epigastric, periumbilical, RUQ and RLQ pain on exam with mild guarding but no rebound. Concern for possible early appy vs viral etiology vs reaction from abx vs cholecystitis.  Lower suspicion for kidney stone, pancreatitis, divericulitis.  CBC, CMP, lipase, UA, HCG and rapid strep pending.  Pt given IVF, zofran and morphine.  11:39 AM Labs consistent with mild leukocytosis but o/w wnl.  On re-eval pt still has significant RLQ/periumbilical pain and will get CT to further eval.  1:33 PM CT neg.  Will po challenge.  Pt tolerating ginger ale and cracker and will d/c home.  Blanchie Dessert, MD 06/02/15 [place], MD 06/02/15 1336

## 2015-06-02 NOTE — ED Notes (Signed)
PO challenge patient crackers and ginger ale by Doctor.

## 2015-06-02 NOTE — ED Notes (Signed)
Doctor at bedside.

## 2015-06-02 NOTE — ED Notes (Signed)
Back from ct.

## 2015-06-04 LAB — CULTURE, GROUP A STREP (THRC)

## 2015-12-23 IMAGING — CT CT NECK W/ CM
4 of 5 series · 16 of 33 positions shown, 19 images · IV contrast (75CC OMNI 300)
Comparison: None.

CLINICAL DATA: 22-year-old female with sore throat, laryngitis,
dysphagia. Fever. Hoarseness and drooling. Initial encounter.

EXAM:
CT NECK WITH CONTRAST
TECHNIQUE: Multidetector CT imaging of the neck was performed using the
standard protocol following the bolus administration of intravenous
contrast.
CONTRAST:  75mL OMNIPAQUE IOHEXOL 300 MG/ML  SOLN

[Series 2: axial neck · axial · 0.39mm/px · z∈[-207,-142]mm · 2 of 105 slices shown]
[im 27/105  bone]
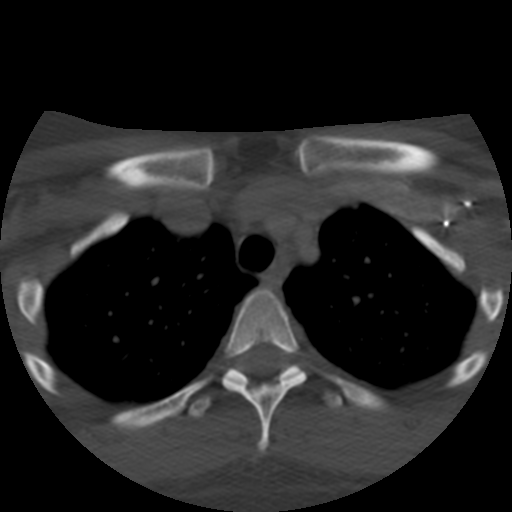
[im 53/105  bone]
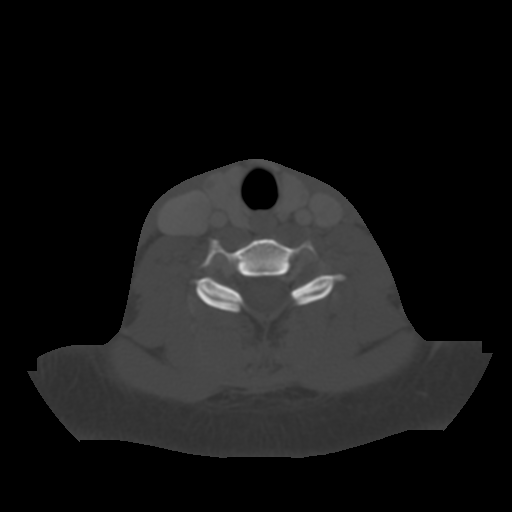

[Series 400: cor · coronal · 0.53mm/px · 3 of 107 slices shown]
[im 26/107  bone]
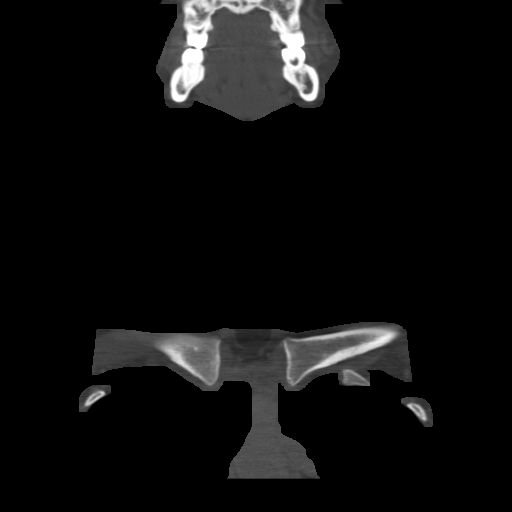
[im 44/107  bone]
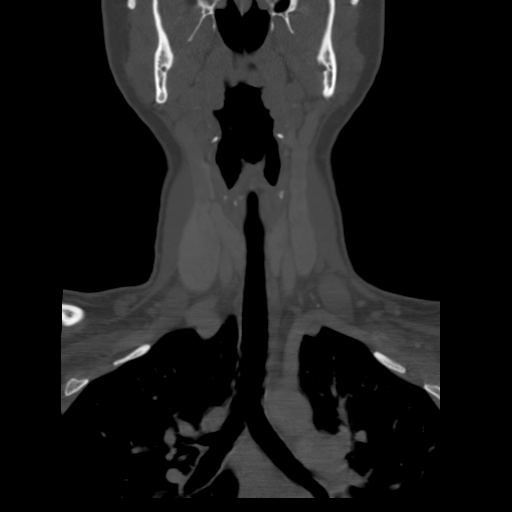
[im 63/107  bone]
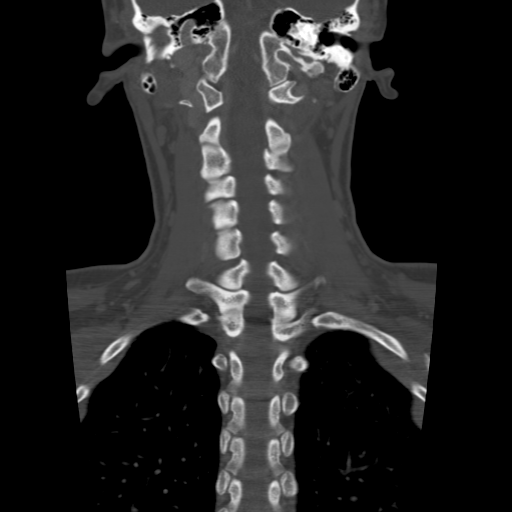

[Series 401: sag · sagittal · 0.53mm/px · 5 of 103 slices shown, 6 images]
[im 35/103  bone]
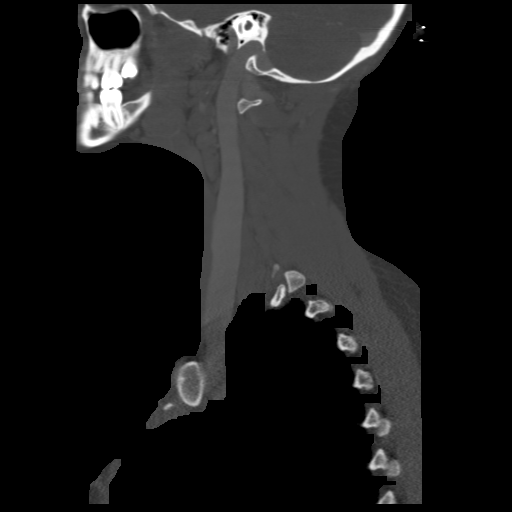
[im 43/103  bone]
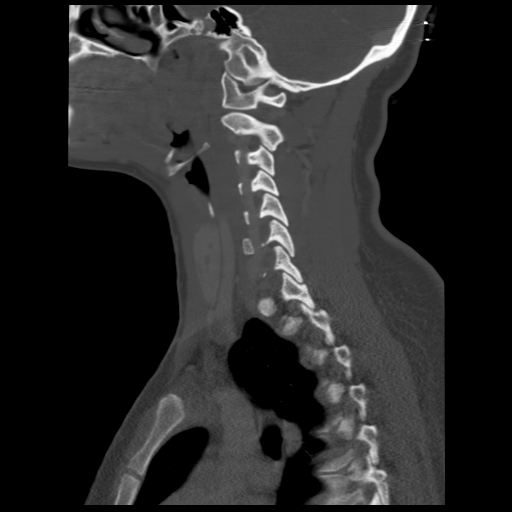
[im 52/103  soft-tissue]
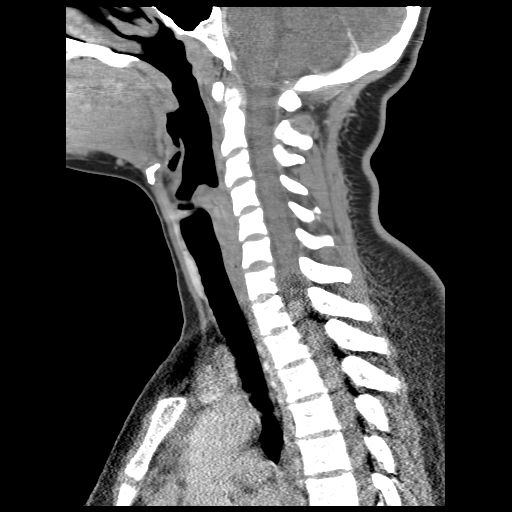
[im 52/103  bone]
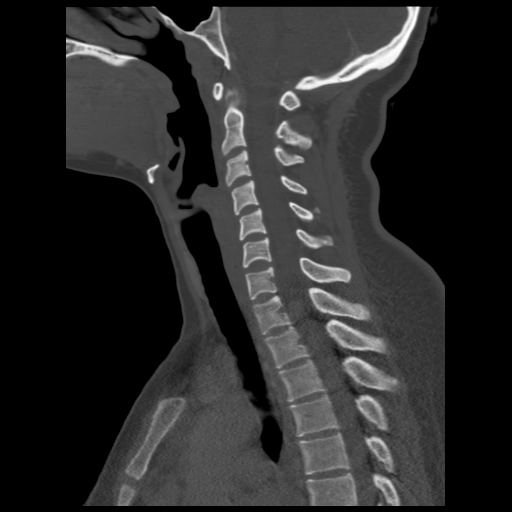
[im 60/103  bone]
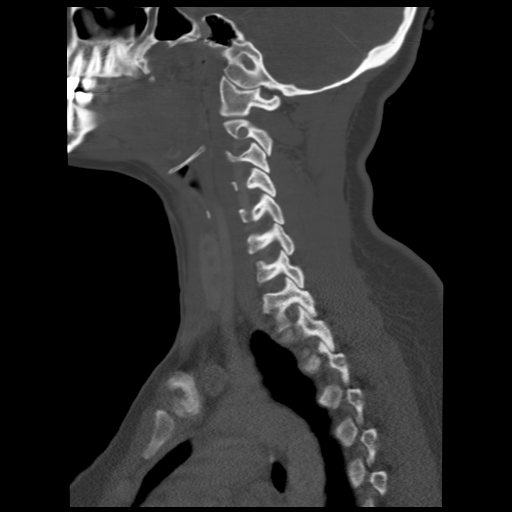
[im 69/103  bone]
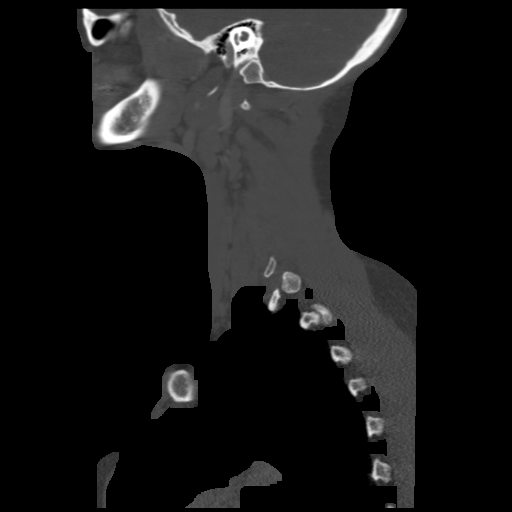

[Series 402: axial · axial · 0.39mm/px · z∈[-278,-71]mm · 6 of 155 slices shown, 8 images]
[im 23/155  soft-tissue]
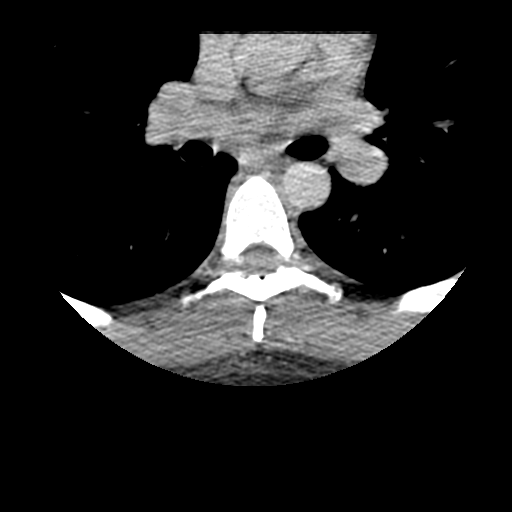
[im 23/155  bone]
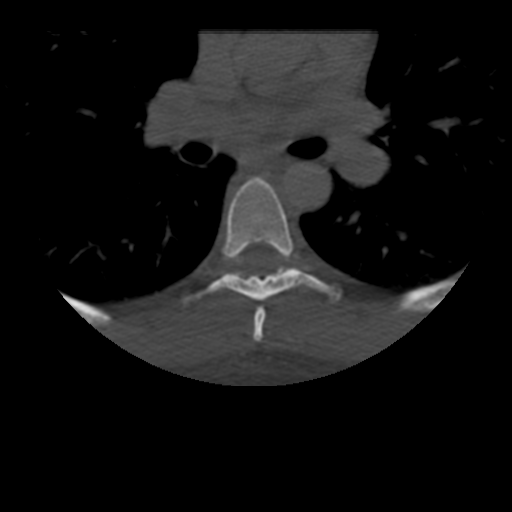
[im 45/155  bone]
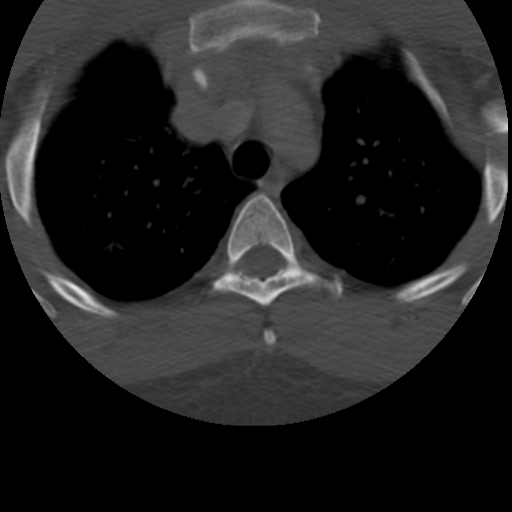
[im 67/155  bone]
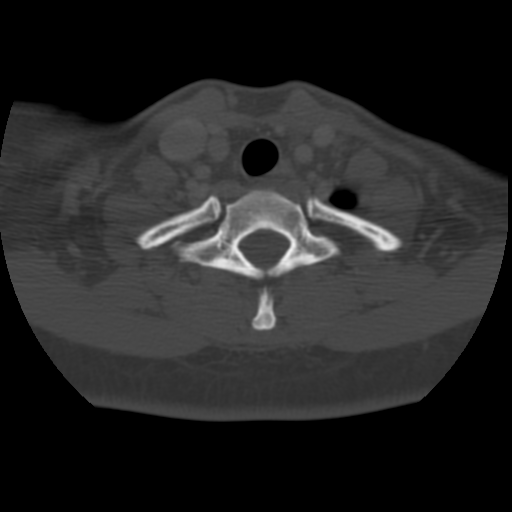
[im 89/155  bone]
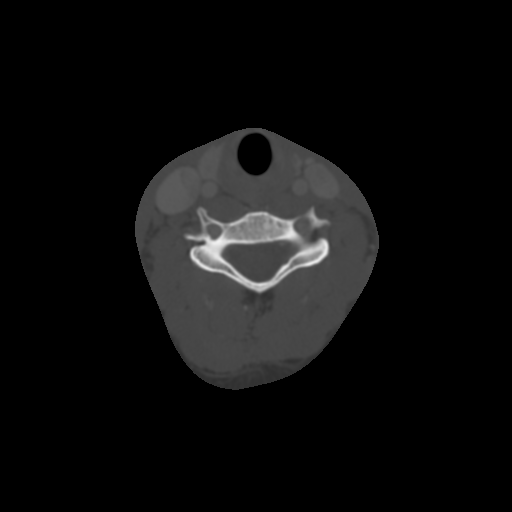
[im 111/155  soft-tissue]
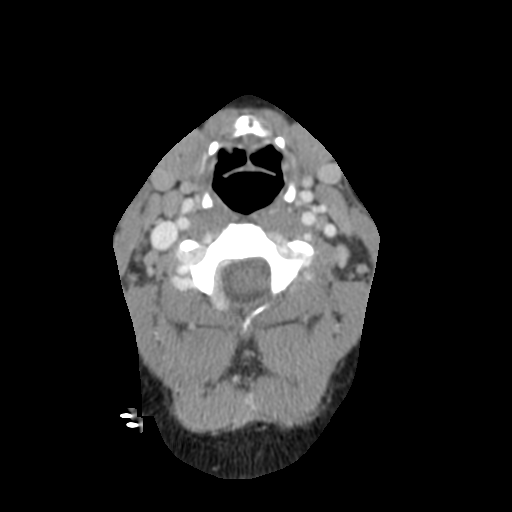
[im 111/155  bone]
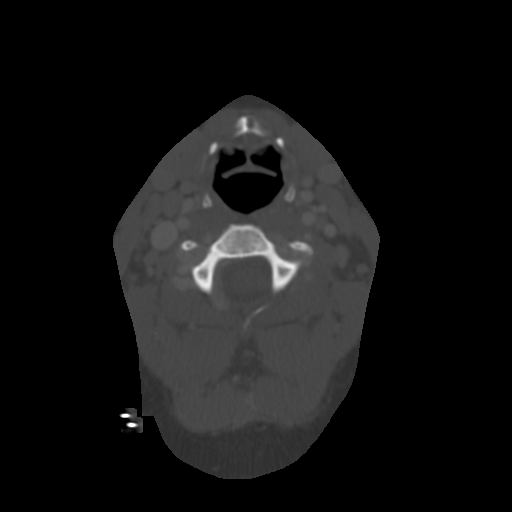
[im 133/155  bone]
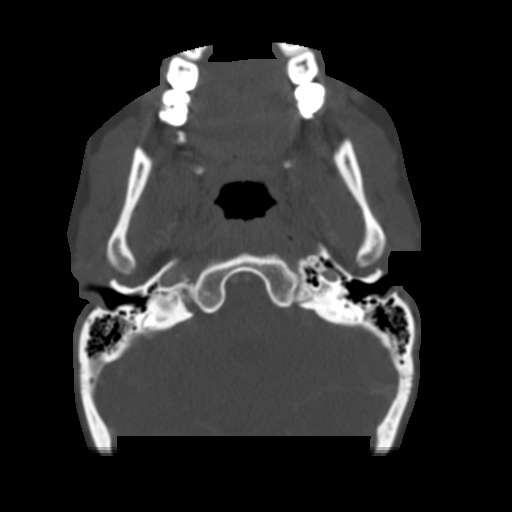

[16 of 33 positions shown; findings below may reference images not displayed]

FINDINGS: Negative lung apices. Negative superior mediastinum. No osseous
abnormality in the visible thorax.

Tiny bilateral hypodense thyroid nodules, too small to characterize,
but most likely benign in the absence of known clinical risk factors
for thyroid carcinoma. Incidental tiny right peritracheal
diverticulum at the thoracic inlet.

The epiglottis is gracile and normal. Larynx within normal limits.
Negative lingual tonsil. Tonsillar pillars are within normal limits.
Adenoid soft tissue within normal limits. Trace retained secretions
in the left nasopharynx. Negative parapharyngeal spaces,
retropharyngeal space, and sublingual space. Submandibular glands
and parotid are within normal limits.

Major vascular structures in the neck and at the skullbase are
patent.

No cervical lymphadenopathy. No focal inflammatory changes
identified in the neck.

Grossly negative visualized brain parenchyma. Visualized paranasal
sinuses and mastoids are clear. No acute osseous abnormality
identified.
IMPRESSION: Normal epiglottis and largely unremarkable CT appearance of the
neck. No focal inflammatory changes. No lymphadenopathy or
significant tonsillar hypertrophy.

Study discussed by telephone with NASIR WAR SALEEMAAN on 05/13/2013 at
9479 hours .

## 2016-01-07 ENCOUNTER — Other Ambulatory Visit: Payer: Self-pay | Admitting: Diagnostic Neuroimaging

## 2016-02-21 ENCOUNTER — Telehealth: Payer: Self-pay | Admitting: Diagnostic Neuroimaging

## 2016-02-21 DIAGNOSIS — G43009 Migraine without aura, not intractable, without status migrainosus: Secondary | ICD-10-CM

## 2016-02-21 NOTE — Telephone Encounter (Signed)
Spoke with patient and informed her that Dr Leta Baptist put in referral to [place] she will get a call from either this office or the new provider's office in approximately a week. She verbalized understanding, appreciation.

## 2016-02-21 NOTE — Telephone Encounter (Signed)
Pt has moved to [place], [place] and is wanting a referral for neurologist there.  She said the HA's have "come back", she has not taken any topamax for 56mth - 1 yr.  She said she can come to [place] if she needs to.

## 2016-03-09 ENCOUNTER — Telehealth: Payer: Self-pay | Admitting: *Deleted

## 2016-03-09 NOTE — Telephone Encounter (Signed)
Patient called back stating she called [facility] to FU on her referral there, and she was told they had "sent a paper back". She stated she did not like the way they talked, and she wants to be referred to another neurologist in [place]. Advised her that this office closes at noon tomorrow, and is closed next [place], Tues for holiday.  Her request will be sent to Dr Leta Baptist and the referral staff. Advised there  may be a delay in putting referral through due to the holiday closing.  She verbalized understanding.

## 2016-03-09 NOTE — Telephone Encounter (Signed)
Spoke with patient and gave her the referral information including phone number. Advised her to call that office and follow up on the referral that was sent to them on 02/23/16.  She then asked about a refill on her topamax. This RN advised that because she hasn't been seen by Dr Leta Baptist since April 2016, he will not refill. Advised she contact her PCP for refill. She verbalized understanding, appreciation of call back.

## 2016-03-09 NOTE — Telephone Encounter (Signed)
Pt called inquiring on referral to Neurologist in [place] and refill on her headache medicine. Please call

## 2016-03-15 NOTE — Telephone Encounter (Signed)
Called and spoke to Patient she wants to Continue her care here with Dr. Leta Baptist . Please advise is this ok ?

## 2016-03-16 NOTE — Telephone Encounter (Signed)
LVM advising patient she may be seen again by Dr Leta Baptist. She was last seen 06/2014. Advised her the phone staff can schedule her follow up, but it may be the end of Feb due to holidays and vacation. Left number for call back.

## 2016-05-03 ENCOUNTER — Encounter: Payer: Self-pay | Admitting: Diagnostic Neuroimaging

## 2016-05-03 ENCOUNTER — Ambulatory Visit (INDEPENDENT_AMBULATORY_CARE_PROVIDER_SITE_OTHER): Payer: BC Managed Care – PPO | Admitting: Diagnostic Neuroimaging

## 2016-05-03 VITALS — BP 96/64 | HR 64 | Wt 188.0 lb

## 2016-05-03 DIAGNOSIS — R55 Syncope and collapse: Secondary | ICD-10-CM

## 2016-05-03 DIAGNOSIS — G43009 Migraine without aura, not intractable, without status migrainosus: Secondary | ICD-10-CM | POA: Diagnosis not present

## 2016-05-03 DIAGNOSIS — Q046 Congenital cerebral cysts: Secondary | ICD-10-CM

## 2016-05-03 MED ORDER — TOPIRAMATE 50 MG PO TABS: 50.0000 mg | ORAL_TABLET | Freq: Two times a day (BID) | ORAL | 12 refills | Status: DC
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
## 2016-05-16 ENCOUNTER — Telehealth: Payer: Self-pay | Admitting: Diagnostic Neuroimaging

## 2016-05-16 NOTE — Telephone Encounter (Signed)
I spoke with the patient and schedule her MRI for 05/31/16 at our [facility] mobile unit.

## 2016-05-16 NOTE — Telephone Encounter (Signed)
Pt returned Emily's call °

## 2016-05-31 ENCOUNTER — Ambulatory Visit (INDEPENDENT_AMBULATORY_CARE_PROVIDER_SITE_OTHER): Payer: BC Managed Care – PPO

## 2016-05-31 DIAGNOSIS — R55 Syncope and collapse: Secondary | ICD-10-CM | POA: Diagnosis not present

## 2016-05-31 DIAGNOSIS — Q046 Congenital cerebral cysts: Secondary | ICD-10-CM

## 2016-05-31 DIAGNOSIS — G43009 Migraine without aura, not intractable, without status migrainosus: Secondary | ICD-10-CM | POA: Diagnosis not present

## 2016-06-12 ENCOUNTER — Telehealth: Payer: Self-pay | Admitting: *Deleted

## 2016-06-12 NOTE — Telephone Encounter (Signed)
Per Dr Leta Baptist, spoke with patient and informed her that her MRI brain is a stable scan. There is no change in the  cyst. Dr Leta Baptist will continue her current plan of monitoring. Advised she continue with topriramate per Dr Gladstone Lighter office note, taking 50 mg twice daily, continue monitoring her symptoms and call for any questions or problems prior to FU in May. She verbalized understanding, appreciation.

## 2016-08-08 ENCOUNTER — Ambulatory Visit: Payer: BC Managed Care – PPO | Admitting: Diagnostic Neuroimaging

## 2016-09-29 ENCOUNTER — Inpatient Hospital Stay (HOSPITAL_COMMUNITY): Payer: BC Managed Care – PPO

## 2016-09-29 ENCOUNTER — Encounter (HOSPITAL_COMMUNITY): Payer: Self-pay | Admitting: *Deleted

## 2016-09-29 ENCOUNTER — Inpatient Hospital Stay (HOSPITAL_COMMUNITY)
Admission: AD | Admit: 2016-09-29 | Discharge: 2016-09-29 | Disposition: A | Discharge status: Home / Self Care | Payer: BC Managed Care – PPO | Source: Ambulatory Visit | Attending: Obstetrics & Gynecology | Admitting: Obstetrics & Gynecology

## 2016-09-29 DIAGNOSIS — Z3A01 Less than 8 weeks gestation of pregnancy: Secondary | ICD-10-CM | POA: Diagnosis not present

## 2016-09-29 DIAGNOSIS — J45909 Unspecified asthma, uncomplicated: Secondary | ICD-10-CM | POA: Insufficient documentation

## 2016-09-29 DIAGNOSIS — R1013 Epigastric pain: Secondary | ICD-10-CM | POA: Diagnosis present

## 2016-09-29 DIAGNOSIS — O9989 Other specified diseases and conditions complicating pregnancy, childbirth and the puerperium: Secondary | ICD-10-CM

## 2016-09-29 DIAGNOSIS — O99511 Diseases of the respiratory system complicating pregnancy, first trimester: Secondary | ICD-10-CM | POA: Diagnosis not present

## 2016-09-29 DIAGNOSIS — G43909 Migraine, unspecified, not intractable, without status migrainosus: Secondary | ICD-10-CM | POA: Diagnosis not present

## 2016-09-29 DIAGNOSIS — N83209 Unspecified ovarian cyst, unspecified side: Secondary | ICD-10-CM | POA: Insufficient documentation

## 2016-09-29 DIAGNOSIS — R1030 Lower abdominal pain, unspecified: Secondary | ICD-10-CM | POA: Diagnosis present

## 2016-09-29 DIAGNOSIS — R12 Heartburn: Secondary | ICD-10-CM

## 2016-09-29 DIAGNOSIS — O3481 Maternal care for other abnormalities of pelvic organs, first trimester: Secondary | ICD-10-CM | POA: Insufficient documentation

## 2016-09-29 DIAGNOSIS — O26891 Other specified pregnancy related conditions, first trimester: Secondary | ICD-10-CM

## 2016-09-29 DIAGNOSIS — Z79899 Other long term (current) drug therapy: Secondary | ICD-10-CM | POA: Diagnosis not present

## 2016-09-29 DIAGNOSIS — O219 Vomiting of pregnancy, unspecified: Secondary | ICD-10-CM | POA: Insufficient documentation

## 2016-09-29 DIAGNOSIS — O99351 Diseases of the nervous system complicating pregnancy, first trimester: Secondary | ICD-10-CM | POA: Diagnosis not present

## 2016-09-29 DIAGNOSIS — O26899 Other specified pregnancy related conditions, unspecified trimester: Secondary | ICD-10-CM

## 2016-09-29 DIAGNOSIS — R109 Unspecified abdominal pain: Secondary | ICD-10-CM

## 2016-09-29 DIAGNOSIS — Z3491 Encounter for supervision of normal pregnancy, unspecified, first trimester: Secondary | ICD-10-CM

## 2016-09-29 HISTORY — DX: Unspecified ovarian cyst, unspecified side: N83.209

## 2016-09-29 LAB — CBC
HEMATOCRIT: 36.3 % (ref 36.0–46.0)
HEMOGLOBIN: 12.5 g/dL (ref 12.0–15.0)
MCH: 31.6 pg (ref 26.0–34.0)
MCHC: 34.4 g/dL (ref 30.0–36.0)
MCV: 91.7 fL (ref 78.0–100.0)
Platelets: 258 10*3/uL (ref 150–400)
RBC: 3.96 MIL/uL (ref 3.87–5.11)
RDW: 12.2 % (ref 11.5–15.5)
WBC: 5.6 10*3/uL (ref 4.0–10.5)

## 2016-09-29 LAB — URINALYSIS, ROUTINE W REFLEX MICROSCOPIC
Bacteria, UA: NONE SEEN
Bilirubin Urine: NEGATIVE
GLUCOSE, UA: NEGATIVE mg/dL
HGB URINE DIPSTICK: NEGATIVE
Ketones, ur: NEGATIVE mg/dL
Leukocytes, UA: NEGATIVE
NITRITE: NEGATIVE
Protein, ur: 30 mg/dL — AB
SPECIFIC GRAVITY, URINE: 1.027 (ref 1.005–1.030)
Squamous Epithelial / LPF: NONE SEEN
pH: 5 (ref 5.0–8.0)

## 2016-09-29 LAB — WET PREP, GENITAL
CLUE CELLS WET PREP: NONE SEEN
Sperm: NONE SEEN
TRICH WET PREP: NONE SEEN
YEAST WET PREP: NONE SEEN

## 2016-09-29 LAB — COMPREHENSIVE METABOLIC PANEL
ALT: 11 U/L — ABNORMAL LOW (ref 14–54)
ANION GAP: 7 (ref 5–15)
AST: 18 U/L (ref 15–41)
Albumin: 3.7 g/dL (ref 3.5–5.0)
Alkaline Phosphatase: 56 U/L (ref 38–126)
BUN: 10 mg/dL (ref 6–20)
CHLORIDE: 108 mmol/L (ref 101–111)
CO2: 21 mmol/L — ABNORMAL LOW (ref 22–32)
Calcium: 9.2 mg/dL (ref 8.9–10.3)
Creatinine, Ser: 0.89 mg/dL (ref 0.44–1.00)
GFR calc non Af Amer: >60 mL/min (ref >60)
Glucose, Bld: 75 mg/dL (ref 65–99)
POTASSIUM: 4 mmol/L (ref 3.5–5.1)
Sodium: 136 mmol/L (ref 135–145)
Total Bilirubin: 0.8 mg/dL (ref 0.3–1.2)
Total Protein: 6.6 g/dL (ref 6.5–8.1)

## 2016-09-29 LAB — POCT PREGNANCY, URINE: Preg Test, Ur: POSITIVE — AB

## 2016-09-29 LAB — LIPASE, BLOOD: LIPASE: 27 U/L (ref 11–51)

## 2016-09-29 LAB — HCG, QUANTITATIVE, PREGNANCY: hCG, Beta Chain, Quant, S: 57357 m[IU]/mL — ABNORMAL HIGH (ref <5)

## 2016-09-29 MED ORDER — PANTOPRAZOLE SODIUM 40 MG IV SOLR
40.0000 mg | Freq: Once | INTRAVENOUS | Status: AC
  Administered 2016-09-29: 40 mg via INTRAVENOUS
  Filled 2016-09-29: qty 40

## 2016-09-29 MED ORDER — LACTATED RINGERS IV BOLUS (SEPSIS)
1000.0000 mL | Freq: Once | INTRAVENOUS | Status: AC
  Administered 2016-09-29: 1000 mL via INTRAVENOUS

## 2016-09-29 MED ORDER — PROMETHAZINE HCL 12.5 MG PO TABS: 12.5000 mg | ORAL_TABLET | Freq: Four times a day (QID) | ORAL | 0 refills | Status: DC | PRN

## 2016-09-29 MED ORDER — PROMETHAZINE HCL 25 MG/ML IJ SOLN
25.0000 mg | Freq: Once | INTRAMUSCULAR | Status: AC
  Administered 2016-09-29: 25 mg via INTRAVENOUS
  Filled 2016-09-29: qty 1

## 2016-09-29 MED ORDER — PANTOPRAZOLE SODIUM 40 MG PO TBEC: 40.0000 mg | DELAYED_RELEASE_TABLET | Freq: Every day | ORAL | 0 refills | Status: DC

## 2016-09-29 NOTE — MAU Note (Addendum)
Having sharp abd pains for 3wks. My mom made me come in tonight. Some yellow vag discharge. Nausea and heartburn but do not vomit. Today having sharp pain in upper abd but previous days pain more in lower abd.

## 2016-09-29 NOTE — MAU Provider Note (Signed)
History     CSN: [id number]  Arrival date and time: 09/29/16 1956  First Provider Initiated Contact with Patient 09/29/16 2156      Chief Complaint  Patient presents with  . Abdominal Pain   HPI Lauren Leonard is a 26 y.o. G2P0010 at [redacted]w[redacted]d by LMP who presents with abdominal pain & nausea/vomiting. Reports lower abdominal pain x 2 weeks. Pain comes & goes and alternates sides, LLQ>RLQ. Rates pain 8/10 when it occurs. Has not treated. Nothing makes better or worse. Also reports mid epigastric pain that started yesterday. Pain is intermittent. Rates pain 8/10. Has not treated. Endorses nausea & decreased appetite but has been able to keep down food and fluids today. Denies vaginal bleeding, diarrhea, constipation, dysuria. Endorses yellow mucoid discharge with foul odor. Has not started prenatal care. No recent intercourse or concern for STIs.  Has had gall bladder removed.   OB History    Gravida Para Term Preterm AB Living   2       1     SAB TAB Ectopic Multiple Live Births   1              Past Medical History:  Diagnosis Date  . Allergy    sinus   . Asthma   . Headache    migraines  . Ovarian cyst     Past Surgical History:  Procedure Laterality Date  . CHOLECYSTECTOMY  06/28/12   lap chole    Family History  Problem Relation Age of Onset  . Cancer Maternal Grandmother        breast  . Diabetes Maternal Grandmother   . Hypertension Maternal Grandmother   . Cancer Maternal Grandfather        panreatic  . Diabetes Maternal Grandfather   . Migraines Mother   . Cerebral aneurysm Other     Social History  Substance Use Topics  . Smoking status: Never Smoker  . Smokeless tobacco: Never Used  . Alcohol use Yes     Comment: social (a few times/year)none since pregnancy    Allergies:  Allergies  Allergen Reactions  . Latex Other (See Comments)    Itches, breaks my hands out  . Citric Acid Rash    Breakouts on chest and back.    Prescriptions Prior to  Admission  Medication Sig Dispense Refill Last Dose  . albuterol (PROVENTIL HFA;VENTOLIN HFA) 108 (90 BASE) MCG/ACT inhaler Inhale 2 puffs into the lungs every 6 (six) hours as needed for wheezing or shortness of breath. 1 Inhaler 0 Taking  . ALLEGRA-D ALLERGY & CONGESTION 180-240 MG per 24 hr tablet Take 1 tablet by mouth at bedtime.   5 Taking  . fluticasone (FLONASE) 50 MCG/ACT nasal spray PLACE 2 SPRAYS INTO BOTH NOSTRILS DAILY. 12.5 g 2 Taking  . lubiprostone (AMITIZA) 8 MCG capsule Take 8 mcg by mouth daily.   Taking  . Probiotic Product ([employer]S PROBIOTIC) CAPS Take 1 capsule by mouth daily.   Taking  . ranitidine (ZANTAC) 150 MG tablet Take 150 mg by mouth daily.   Taking  . topiramate (TOPAMAX) 50 MG tablet Take 1 tablet (50 mg total) by mouth 2 (two) times daily. 60 tablet 12     Review of Systems  Constitutional: Positive for appetite change. Negative for chills and fever.  Gastrointestinal: Positive for abdominal pain and nausea. Negative for constipation, diarrhea and vomiting.  Genitourinary: Positive for vaginal discharge. Negative for dysuria and vaginal bleeding.  Musculoskeletal: Negative for back pain.  Physical Exam   Blood pressure (!) 104/52, pulse 65, temperature 98.5 F (36.9 C), resp. rate 18, height 5\' 6"  (1.676 m), weight 184 lb (83.5 kg), last menstrual period 08/15/2016.  Physical Exam  Nursing note and vitals reviewed. Constitutional: She is oriented to person, place, and time. She appears well-developed and well-nourished. No distress.  HENT:  Head: Normocephalic and atraumatic.  Eyes: Conjunctivae are normal. Right eye exhibits no discharge. Left eye exhibits no discharge. No scleral icterus.  Neck: Normal range of motion.  Cardiovascular: Normal rate, regular rhythm and normal heart sounds.   No murmur heard. Respiratory: Effort normal and breath sounds normal. No respiratory distress. She has no wheezes.  GI: Soft. Bowel sounds are normal. There is  tenderness in the epigastric area and suprapubic area. There is no rigidity, no rebound, no guarding and negative Murphy's sign.  Neurological: She is alert and oriented to person, place, and time.  Skin: Skin is warm and dry. She is not diaphoretic.  Psychiatric: She has a normal mood and affect. Her behavior is normal. Judgment and thought content normal.    MAU Course  Procedures Results for orders placed or performed during the hospital encounter of 09/29/16 (from the past 24 hour(s))  Urinalysis, Routine w reflex microscopic     Status: Abnormal   Collection Time: 09/29/16  8:35 PM  Result Value Ref Range   Color, Urine YELLOW YELLOW   APPearance HAZY (A) CLEAR   Specific Gravity, Urine 1.027 1.005 - 1.030   pH 5.0 5.0 - 8.0   Glucose, UA NEGATIVE NEGATIVE mg/dL   Hgb urine dipstick NEGATIVE NEGATIVE   Bilirubin Urine NEGATIVE NEGATIVE   Ketones, ur NEGATIVE NEGATIVE mg/dL   Protein, ur 30 (A) NEGATIVE mg/dL   Nitrite NEGATIVE NEGATIVE   Leukocytes, UA NEGATIVE NEGATIVE   RBC / HPF 0-5 0 - 5 RBC/hpf   WBC, UA 0-5 0 - 5 WBC/hpf   Bacteria, UA NONE SEEN NONE SEEN   Squamous Epithelial / LPF NONE SEEN NONE SEEN   Mucous PRESENT   Pregnancy, urine POC     Status: Abnormal   Collection Time: 09/29/16  8:46 PM  Result Value Ref Range   Preg Test, Ur POSITIVE (A) NEGATIVE  CBC     Status: None   Collection Time: 09/29/16 10:20 PM  Result Value Ref Range   WBC 5.6 4.0 - 10.5 K/uL   RBC 3.96 3.87 - 5.11 MIL/uL   Hemoglobin 12.5 12.0 - 15.0 g/dL   HCT 36.3 36.0 - 46.0 %   MCV 91.7 78.0 - 100.0 fL   MCH 31.6 26.0 - 34.0 pg   MCHC 34.4 30.0 - 36.0 g/dL   RDW 12.2 11.5 - 15.5 %   Platelets 258 150 - 400 K/uL  hCG, quantitative, pregnancy     Status: Abnormal   Collection Time: 09/29/16 10:20 PM  Result Value Ref Range   hCG, Beta Chain, Quant, S 57,357 (H) <5 mIU/mL  Comprehensive metabolic panel     Status: Abnormal   Collection Time: 09/29/16 10:20 PM  Result Value Ref  Range   Sodium 136 135 - 145 mmol/L   Potassium 4.0 3.5 - 5.1 mmol/L   Chloride 108 101 - 111 mmol/L   CO2 21 (L) 22 - 32 mmol/L   Glucose, Bld 75 65 - 99 mg/dL   BUN 10 6 - 20 mg/dL   Creatinine, Ser 0.89 0.44 - 1.00 mg/dL   Calcium 9.2 8.9 - 10.3 mg/dL  Total Protein 6.6 6.5 - 8.1 g/dL   Albumin 3.7 3.5 - 5.0 g/dL   AST 18 15 - 41 U/L   ALT 11 (L) 14 - 54 U/L   Alkaline Phosphatase 56 38 - 126 U/L   Total Bilirubin 0.8 0.3 - 1.2 mg/dL   GFR calc non Af Amer >60 >60 mL/min   GFR calc Af Amer >60 >60 mL/min   Anion gap 7 5 - 15  Lipase, blood     Status: None   Collection Time: 09/29/16 10:20 PM  Result Value Ref Range   Lipase 27 11 - 51 U/L  Wet prep, genital     Status: Abnormal   Collection Time: 09/29/16 10:47 PM  Result Value Ref Range   Yeast Wet Prep HPF POC NONE SEEN NONE SEEN   Trich, Wet Prep NONE SEEN NONE SEEN   Clue Cells Wet Prep HPF POC NONE SEEN NONE SEEN   WBC, Wet Prep HPF POC MODERATE (A) NONE SEEN   Sperm NONE SEEN    US Ob Comp Less 14 Wks  Result Date: 09/29/2016 CLINICAL DATA:  Abdominal pain for 3 weeks EXAM: OBSTETRIC <14 WK [place] AND TRANSVAGINAL OB US TECHNIQUE: Both transabdominal and transvaginal ultrasound examinations were performed for complete evaluation of the gestation as well as the maternal uterus, adnexal regions, and pelvic cul-de-sac. Transvaginal technique was performed to assess early pregnancy. COMPARISON:  None. FINDINGS: Intrauterine gestational sac: Single intrauterine pregnancy Yolk sac:  Visualized Embryo:  Visualized Cardiac Activity: Visualized Heart Rate: 117  bpm CRL:  6.3  mm   6 w   3 d                  [place] EDC: 05/22/2017 Subchorionic hemorrhage:  None visualized. Maternal uterus/adnexae: Bilateral ovaries are within normal limits. The right ovary measures 3.3 x 3.6 x 2.9 cm and contains a probable corpus luteal cyst. The left ovary measures 1.8 by 3 x 1.4 cm. No free fluid. IMPRESSION: Single viable intrauterine pregnancy as  above. There are no other specific abnormalities visualized. Electronically Signed   By: Donavan Foil M.D.   On: 09/29/2016 23:59   US Ob Transvaginal  Result Date: 09/29/2016 CLINICAL DATA:  Abdominal pain for 3 weeks EXAM: OBSTETRIC <14 WK [place] AND TRANSVAGINAL OB US TECHNIQUE: Both transabdominal and transvaginal ultrasound examinations were performed for complete evaluation of the gestation as well as the maternal uterus, adnexal regions, and pelvic cul-de-sac. Transvaginal technique was performed to assess early pregnancy. COMPARISON:  None. FINDINGS: Intrauterine gestational sac: Single intrauterine pregnancy Yolk sac:  Visualized Embryo:  Visualized Cardiac Activity: Visualized Heart Rate: 117  bpm CRL:  6.3  mm   6 w   3 d                  [place] EDC: 05/22/2017 Subchorionic hemorrhage:  None visualized. Maternal uterus/adnexae: Bilateral ovaries are within normal limits. The right ovary measures 3.3 x 3.6 x 2.9 cm and contains a probable corpus luteal cyst. The left ovary measures 1.8 by 3 x 1.4 cm. No free fluid. IMPRESSION: Single viable intrauterine pregnancy as above. There are no other specific abnormalities visualized. Electronically Signed   By: Donavan Foil M.D.   On: 09/29/2016 23:59    MDM +UPT UA, wet prep, GC/chlamydia, CBC, ABO/Rh, quant hCG, HIV, and [place] today to rule out ectopic pregnancy Ultrasound shows SIUP with cardiac activity IV fluid bolus, phenergan & protonix Patient reports improvement in nausea & epigastric  pain Assessment and Plan  A; 1. Normal IUP (intrauterine pregnancy) on prenatal ultrasound, first trimester   2. Abdominal pain during pregnancy   3. Nausea and vomiting during pregnancy prior to [redacted] weeks gestation   4. Heartburn during pregnancy in first trimester    P: Discharge home Rx phenergan & protonix Discussed reasons to return to MAU Start prenatal care  GC/CT pending   Jorje Guild 09/29/2016, 9:56 PM

## 2016-09-29 NOTE — Discharge Instructions (Signed)
Safe Medications in Pregnancy   Acne: Benzoyl Peroxide Salicylic Acid  Backache/Headache: Tylenol: 2 regular strength every 4 hours OR              2 Extra strength every 6 hours  Colds/Coughs/Allergies: Benadryl (alcohol free) 25 mg every 6 hours as needed Breath right strips Claritin Cepacol throat lozenges Chloraseptic throat spray Cold-Eeze- up to three times per day Cough drops, alcohol free Flonase (by prescription only) Guaifenesin Mucinex Robitussin DM (plain only, alcohol free) Saline nasal spray/drops Sudafed (pseudoephedrine) & Actifed ** use only after [redacted] weeks gestation and if you do not have high blood pressure Tylenol Vicks Vaporub Zinc lozenges Zyrtec   Constipation: Colace Ducolax suppositories Fleet enema Glycerin suppositories Metamucil Milk of magnesia Miralax Senokot Smooth move tea  Diarrhea: Kaopectate Imodium A-D  *NO pepto Bismol  Hemorrhoids: Anusol Anusol HC Preparation H Tucks  Indigestion: Tums Maalox Mylanta Zantac  Pepcid  Insomnia: Benadryl (alcohol free) 25mg  every 6 hours as needed Tylenol PM Unisom, no Gelcaps  Leg Cramps: Tums MagGel  Nausea/Vomiting:  Bonine Dramamine Emetrol Ginger extract Sea bands Meclizine  Nausea medication to take during pregnancy:  Unisom (doxylamine succinate 25 mg tablets) Take one tablet daily at bedtime. If symptoms are not adequately controlled, the dose can be increased to a maximum recommended dose of two tablets daily (1/2 tablet in the morning, 1/2 tablet mid-afternoon and one at bedtime). Vitamin B6 100mg  tablets. Take one tablet twice a day (up to 200 mg per day).  Skin Rashes: Aveeno products Benadryl cream or 25mg  every 6 hours as needed Calamine Lotion 1% cortisone cream  Yeast infection: Gyne-lotrimin 7 Monistat 7  Gum/tooth pain: Anbesol  **If taking multiple medications, please check labels to avoid duplicating the same active ingredients **take  medication as directed on the label ** Do not exceed 4000 mg of tylenol in 24 hours **Do not take medications that contain aspirin or ibuprofen    [place] for [place] at [facility]       Phone: [place] for [place] at [place] Phone: [place] for [employer] at [place]  Phone: [place] for [place] at [employer]  Phone: [place] for [place] at [place]  Phone: [facility]       Phone: [phone]  [facility]    Phone: [phone]   [facility] [place])    Phone: [phone]  [facility]    Phone: [phone]  [facility]    Phone: [phone]   [place] [facility]  Phone: [phone]  [place]    Phone: [phone]  [facility]   Phone: [phone]  [facility]    Phone: [phone]     Heartburn During Pregnancy Heartburn is a type of pain or discomfort that can happen in the throat or chest. It is often described as a burning sensation. Heartburn is common during pregnancy because:  A hormone (progesterone) that is released during pregnancy may relax the valve (lower esophageal sphincter, or LES) that separates the esophagus from the stomach. This allows stomach acid to move up into the esophagus, causing heartburn.  The uterus gets larger and pushes up on the stomach, which pushes more acid into the esophagus. This is especially true in the later stages of pregnancy.  Heartburn usually goes away or gets better  after giving birth. What are the causes? Heartburn is caused by stomach acid backing up into the esophagus (reflux). Reflux can be triggered by:  Changing hormone levels.  Large meals.  Certain  foods and beverages, such as coffee, chocolate, onions, and peppermint.  Exercise.  Increased stomach acid production.  What increases the risk? You are more likely to experience heartburn during pregnancy if you:  Had heartburn prior to becoming pregnant.  Have been pregnant more than once before.  Are overweight or obese.  The likelihood that you will get heartburn also increases as you get farther along in your pregnancy, especially during the last trimester. What are the signs or symptoms? Symptoms of this condition include:  Burning pain in the chest or lower throat.  Bitter taste in the mouth.  Coughing.  Problems swallowing.  Vomiting.  Hoarse voice.  Asthma.  Symptoms may get worse when you lie down or bend over. Symptoms are often worse at night. How is this diagnosed? This condition is diagnosed based on:  Your medical history.  Your symptoms.  Blood tests to check for a certain type of bacteria associated with heartburn.  Whether taking heartburn medicine relieves your symptoms.  Examination of the stomach and esophagus using a tube with a light and camera on the end (endoscopy).  How is this treated? Treatment varies depending on how severe your symptoms are. Your health care provider may recommend:  Over-the-counter medicines (antacids or acid reducers) for mild heartburn.  Prescription medicines to decrease stomach acid or to protect your stomach lining.  Certain changes in your diet.  Raising the head of your bed so it is higher than the foot of the bed. This helps prevent stomach acid from backing up into the esophagus when you are lying down.  Follow these instructions at home: Eating and drinking  Do not drink alcohol during your pregnancy.  Identify foods and beverages that make your symptoms worse, and avoid them.  Beverages that you may want to avoid include: ? Coffee and tea (with or without caffeine). ? Energy drinks and sports  drinks. ? Carbonated drinks or sodas. ? Citrus fruit juices.  Foods that you may want to avoid include: ? Chocolate and cocoa. ? Peppermint and mint flavorings. ? Garlic, onions, and horseradish. ? Spicy and acidic foods, including peppers, chili powder, curry powder, vinegar, hot sauces, and barbecue sauce. ? Citrus fruits, such as oranges, lemons, and limes. ? Tomato-based foods, such as red sauce, chili, and salsa. ? Fried and fatty foods, such as donuts, french fries, potato chips, and high-fat dressings. ? High-fat meats, such as hot dogs, cold cuts, sausage, ham, and bacon. ? High-fat dairy items, such as whole milk, butter, and cheese.  Eat small, frequent meals instead of large meals.  Avoid drinking large amounts of liquid with your meals.  Avoid eating meals during the 2-3 hours before bedtime.  Avoid lying down right after you eat.  Do not exercise right after you eat. Medicines  Take over-the-counter and prescription medicines only as told by your health care provider.  Do not take aspirin, ibuprofen, or other NSAIDs unless your health care provider tells you to do that.  You may be instructed to avoid medicines that contain sodium bicarbonate. General instructions  If directed, raise the head of your bed about 6 inches (15 cm) by putting blocks under the legs. Sleeping with more pillows does not effectively relieve heartburn because it only changes the position of your head.  Do  not use any products that contain nicotine or tobacco, such as cigarettes and e-cigarettes. If you need help quitting, ask your health care provider.  Wear loose-fitting clothing.  Try to reduce your stress, such as with yoga or meditation. If you need help managing stress, ask your health care provider.  Maintain a healthy weight. If you are overweight, work with your health care provider to safely lose weight.  Keep all follow-up visits as told by your health care provider. This is  important. Contact a health care provider if:  You develop new symptoms.  Your symptoms do not improve with treatment.  You have unexplained weight loss.  You have difficulty swallowing.  You make loud sounds when you breathe (wheeze).  You have a cough that does not go away.  You have frequent heartburn for more than 2 weeks.  You have nausea or vomiting that does not get better with treatment.  You have pain in your abdomen. Get help right away if:  You have severe chest pain that spreads to your arm, neck, or jaw.  You feel sweaty, dizzy, or light-headed.  You have shortness of breath.  You have pain when swallowing.  You vomit, and your vomit looks like blood or coffee grounds.  Your stool is bloody or black. This information is not intended to replace advice given to you by your health care provider. Make sure you discuss any questions you have with your health care provider. Document Released: 03/03/2000 Document Revised: 11/22/2015 Document Reviewed: 11/22/2015 Elsevier Interactive Patient Education  2018 [employer].

## 2016-10-02 LAB — GC/CHLAMYDIA PROBE AMP (~~LOC~~) NOT AT ARMC
Chlamydia: NEGATIVE
Neisseria Gonorrhea: NEGATIVE

## 2016-10-27 ENCOUNTER — Other Ambulatory Visit: Payer: Self-pay | Admitting: Student

## 2016-12-02 ENCOUNTER — Inpatient Hospital Stay (HOSPITAL_COMMUNITY)
Admission: AD | Admit: 2016-12-02 | Discharge: 2016-12-02 | Disposition: A | Discharge status: Home / Self Care | Payer: BC Managed Care – PPO | Source: Ambulatory Visit | Attending: Obstetrics and Gynecology | Admitting: Obstetrics and Gynecology

## 2016-12-02 ENCOUNTER — Encounter (HOSPITAL_COMMUNITY): Payer: Self-pay | Admitting: *Deleted

## 2016-12-02 DIAGNOSIS — X58XXXA Exposure to other specified factors, initial encounter: Secondary | ICD-10-CM | POA: Diagnosis not present

## 2016-12-02 DIAGNOSIS — O9989 Other specified diseases and conditions complicating pregnancy, childbirth and the puerperium: Secondary | ICD-10-CM | POA: Diagnosis not present

## 2016-12-02 DIAGNOSIS — O26892 Other specified pregnancy related conditions, second trimester: Secondary | ICD-10-CM | POA: Insufficient documentation

## 2016-12-02 DIAGNOSIS — Z3A15 15 weeks gestation of pregnancy: Secondary | ICD-10-CM | POA: Diagnosis not present

## 2016-12-02 DIAGNOSIS — T148XXA Other injury of unspecified body region, initial encounter: Secondary | ICD-10-CM

## 2016-12-02 DIAGNOSIS — R109 Unspecified abdominal pain: Secondary | ICD-10-CM | POA: Insufficient documentation

## 2016-12-02 LAB — URINALYSIS, ROUTINE W REFLEX MICROSCOPIC
BILIRUBIN URINE: NEGATIVE
GLUCOSE, UA: NEGATIVE mg/dL
HGB URINE DIPSTICK: NEGATIVE
Ketones, ur: NEGATIVE mg/dL
Leukocytes, UA: NEGATIVE
Nitrite: NEGATIVE
PROTEIN: NEGATIVE mg/dL
Specific Gravity, Urine: 1.009 (ref 1.005–1.030)
pH: 5 (ref 5.0–8.0)

## 2016-12-02 NOTE — Discharge Instructions (Signed)
Muscle Strain A muscle strain (pulled muscle) happens when a muscle is stretched beyond normal length. It happens when a sudden, violent force stretches your muscle too far. Usually, a few of the fibers in your muscle are torn. Muscle strain is common in athletes. Recovery usually takes 1-2 weeks. Complete healing takes 5-6 weeks. Follow these instructions at home:  Follow the PRICE method of treatment to help your injury get better. Do this the first 2-3 days after the injury: ? Protect. Protect the muscle to keep it from getting injured again. ? Rest. Limit your activity and rest the injured body part. ? Ice. Put ice in a plastic bag. Place a towel between your skin and the bag. Then, apply the ice and leave it on from 15-20 minutes each hour. After the third day, switch to moist heat packs. ? Compression. Use a splint or elastic bandage on the injured area for comfort. Do not put it on too tightly. ? Elevate. Keep the injured body part above the level of your heart.  Only take medicine as told by your doctor.  Warm up before doing exercise to prevent future muscle strains. Contact a doctor if:  You have more pain or puffiness (swelling) in the injured area.  You feel numbness, tingling, or notice a loss of strength in the injured area. This information is not intended to replace advice given to you by your health care provider. Make sure you discuss any questions you have with your health care provider. Document Released: 12/14/2007 Document Revised: 08/12/2015 Document Reviewed: 10/03/2012 Elsevier Interactive Patient Education  2017 Elsevier Inc.  

## 2016-12-02 NOTE — MAU Note (Signed)
Bed broke during the night. Moved the [employer] and box spring.  Since then. Has had sharp pulling pain down abd into vagina. Not as bad as it was last night. No bleeding.

## 2016-12-02 NOTE — MAU Provider Note (Signed)
History   G2P0010 @ 15.4 wks in with c/o abd pain after moving her mattress around. Pain is in upper abd.  CSN: [id number]  Arrival date & time 12/02/16  1731   First Provider Initiated Contact with Patient 12/02/16 1752      Chief Complaint  Patient presents with  . Abdominal Pain    HPI  Past Medical History:  Diagnosis Date  . Allergy    sinus   . Asthma   . Headache    migraines  . Ovarian cyst     Past Surgical History:  Procedure Laterality Date  . CHOLECYSTECTOMY  06/28/12   lap chole  . CHOLECYSTECTOMY      Family History  Problem Relation Age of Onset  . Cancer Maternal Grandmother        breast  . Diabetes Maternal Grandmother   . Hypertension Maternal Grandmother   . Cancer Maternal Grandfather        panreatic  . Diabetes Maternal Grandfather   . Migraines Mother   . Cerebral aneurysm Other     Social History  Substance Use Topics  . Smoking status: Never Smoker  . Smokeless tobacco: Never Used  . Alcohol use Yes     Comment: social (a few times/year)none since pregnancy    OB History    Gravida Para Term Preterm AB Living   2       1     SAB TAB Ectopic Multiple Live Births   1              Review of Systems  Constitutional: Negative.   HENT: Negative.   Eyes: Negative.   Respiratory: Negative.   Cardiovascular: Negative.   Gastrointestinal: Positive for abdominal pain.  Endocrine: Negative.   Genitourinary: Negative.   Musculoskeletal: Negative.   Skin: Negative.   Allergic/Immunologic: Negative.   Neurological: Negative.   Hematological: Negative.   Psychiatric/Behavioral: Negative.     Allergies  Latex and Citric acid  Home Medications    BP 111/61 (BP Location: Right Arm)   Pulse 87   Temp 98.3 F (36.8 C) (Oral)   Resp 18   Wt 191 lb 4 oz (86.8 kg)   LMP 08/15/2016   SpO2 99%   BMI 30.87 kg/m   Physical Exam  Constitutional: She is oriented to person, place, and time. She appears well-developed and  well-nourished.  HENT:  Head: Normocephalic.  Eyes: Pupils are equal, round, and reactive to light.  Neck: Normal range of motion.  Cardiovascular: Normal rate, regular rhythm, normal heart sounds and intact distal pulses.   Pulmonary/Chest: Effort normal and breath sounds normal.  Abdominal: Soft. Bowel sounds are normal.  Musculoskeletal: Normal range of motion.  Neurological: She is alert and oriented to person, place, and time. She has normal reflexes.  Skin: Skin is warm and dry.  Psychiatric: She has a normal mood and affect. Her behavior is normal. Judgment and thought content normal.    MAU Course  Procedures (including critical care time)  Labs Reviewed  URINALYSIS, ROUTINE W REFLEX MICROSCOPIC   No results found.   1. Muscle strain       MDM  VSS, FHR st and reg per doppler. Discussed proper body mechanics. No more heavy lifting of furniture. POC discussed with Dr. Royston Sinner pt to be d/c home.

## 2017-01-09 ENCOUNTER — Encounter (HOSPITAL_COMMUNITY): Payer: Self-pay | Admitting: *Deleted

## 2017-01-09 ENCOUNTER — Inpatient Hospital Stay (HOSPITAL_COMMUNITY)
Admission: AD | Admit: 2017-01-09 | Discharge: 2017-01-09 | Disposition: A | Discharge status: Home / Self Care | Payer: BC Managed Care – PPO | Source: Ambulatory Visit | Attending: Obstetrics and Gynecology | Admitting: Obstetrics and Gynecology

## 2017-01-09 DIAGNOSIS — T6291XA Toxic effect of unspecified noxious substance eaten as food, accidental (unintentional), initial encounter: Secondary | ICD-10-CM | POA: Diagnosis not present

## 2017-01-09 DIAGNOSIS — IMO0001 Reserved for inherently not codable concepts without codable children: Secondary | ICD-10-CM

## 2017-01-09 DIAGNOSIS — R109 Unspecified abdominal pain: Secondary | ICD-10-CM | POA: Diagnosis not present

## 2017-01-09 DIAGNOSIS — O26892 Other specified pregnancy related conditions, second trimester: Secondary | ICD-10-CM | POA: Diagnosis not present

## 2017-01-09 DIAGNOSIS — O210 Mild hyperemesis gravidarum: Secondary | ICD-10-CM | POA: Diagnosis present

## 2017-01-09 DIAGNOSIS — Z3A21 21 weeks gestation of pregnancy: Secondary | ICD-10-CM | POA: Diagnosis not present

## 2017-01-09 LAB — CBC
HEMATOCRIT: 35.8 % — AB (ref 36.0–46.0)
Hemoglobin: 12.4 g/dL (ref 12.0–15.0)
MCH: 31.7 pg (ref 26.0–34.0)
MCHC: 34.6 g/dL (ref 30.0–36.0)
MCV: 91.6 fL (ref 78.0–100.0)
Platelets: 220 10*3/uL (ref 150–400)
RBC: 3.91 MIL/uL (ref 3.87–5.11)
RDW: 13.4 % (ref 11.5–15.5)
WBC: 7.8 10*3/uL (ref 4.0–10.5)

## 2017-01-09 LAB — URINALYSIS, ROUTINE W REFLEX MICROSCOPIC
Bilirubin Urine: NEGATIVE
GLUCOSE, UA: NEGATIVE mg/dL
HGB URINE DIPSTICK: NEGATIVE
Ketones, ur: NEGATIVE mg/dL
Leukocytes, UA: NEGATIVE
Nitrite: NEGATIVE
Protein, ur: NEGATIVE mg/dL
SPECIFIC GRAVITY, URINE: 1.02 (ref 1.005–1.030)
pH: 7 (ref 5.0–8.0)

## 2017-01-09 LAB — LIPASE, BLOOD: LIPASE: 23 U/L (ref 11–51)

## 2017-01-09 LAB — COMPREHENSIVE METABOLIC PANEL
ALT: 39 U/L (ref 14–54)
ANION GAP: 7 (ref 5–15)
AST: 31 U/L (ref 15–41)
Albumin: 3.3 g/dL — ABNORMAL LOW (ref 3.5–5.0)
Alkaline Phosphatase: 66 U/L (ref 38–126)
BILIRUBIN TOTAL: 0.8 mg/dL (ref 0.3–1.2)
BUN: 7 mg/dL (ref 6–20)
CO2: 22 mmol/L (ref 22–32)
Calcium: 8.2 mg/dL — ABNORMAL LOW (ref 8.9–10.3)
Chloride: 101 mmol/L (ref 101–111)
Creatinine, Ser: 0.58 mg/dL (ref 0.44–1.00)
GFR calc Af Amer: >60 mL/min (ref >60)
Glucose, Bld: 96 mg/dL (ref 65–99)
POTASSIUM: 3.9 mmol/L (ref 3.5–5.1)
Sodium: 130 mmol/L — ABNORMAL LOW (ref 135–145)
TOTAL PROTEIN: 6.8 g/dL (ref 6.5–8.1)

## 2017-01-09 MED ORDER — METOCLOPRAMIDE HCL 5 MG/ML IJ SOLN
10.0000 mg | Freq: Once | INTRAMUSCULAR | Status: AC
  Administered 2017-01-09: 10 mg via INTRAVENOUS
  Filled 2017-01-09: qty 2

## 2017-01-09 MED ORDER — DICYCLOMINE HCL 10 MG PO CAPS: 10.0000 mg | ORAL_CAPSULE | Freq: Three times a day (TID) | ORAL | 0 refills | Status: DC

## 2017-01-09 MED ORDER — METOCLOPRAMIDE HCL 10 MG PO TABS: 10.0000 mg | ORAL_TABLET | Freq: Three times a day (TID) | ORAL | 0 refills | Status: DC | PRN

## 2017-01-09 MED ORDER — LACTATED RINGERS IV BOLUS (SEPSIS)
1000.0000 mL | Freq: Once | INTRAVENOUS | Status: AC
  Administered 2017-01-09: 1000 mL via INTRAVENOUS

## 2017-01-09 MED ORDER — FAMOTIDINE IN NACL 20-0.9 MG/50ML-% IV SOLN
20.0000 mg | Freq: Once | INTRAVENOUS | Status: AC
  Administered 2017-01-09: 20 mg via INTRAVENOUS
  Filled 2017-01-09: qty 50

## 2017-01-09 MED ORDER — DICYCLOMINE HCL 10 MG PO CAPS
10.0000 mg | ORAL_CAPSULE | Freq: Once | ORAL | Status: AC
  Administered 2017-01-09: 10 mg via ORAL
  Filled 2017-01-09: qty 1

## 2017-01-09 NOTE — Discharge Instructions (Signed)
Food Poisoning Food poisoning is an illness that is caused by eating or drinking contaminated foods or drinks. Food poisoning is usually mild and lasts 1-2 days. Foods and drinks can become contaminated because of:  Poor personal hygiene, such as not washing hands well enough or often.  Not storing food properly. For example, not refrigerating raw meat.  Serving, preparing, and storing food on surfaces that are not clean.  Cooking or eating with utensils that are not clean.  The most common causes of this condition are:  Viruses.  Bacteria.  Parasites.  Follow these instructions at home: Eating and drinking   Drink enough fluids to keep your pee (urine) clear or pale yellow. You may need to drink small amounts of clear liquids often.  Avoid: ? Milk. ? Caffeine. ? Alcohol.  Ask your doctor exactly how you should get enough fluid in your body (rehydrate).  Eat small meals often rather than eating large meals. Medicines  Take over-the-counter and prescription medicines only as told by your doctor. Ask your doctor if you should continue to take any of your regular prescribed and over-the-counter medicines.  If you were prescribed an antibiotic medicine, take it as told by your doctor. Do not stop taking the antibiotic even if you start to feel better. General instructions   Wash your hands fully before you prepare food and after you go to the bathroom (use the toilet). Make sure people who live with you also wash their hands often.  Clean surfaces that you touch with a product that contains chlorine bleach.  Keep all follow-up visits as told by your doctor. This is important. How is this prevented?  Wash your hands, food preparation surfaces, and utensils before and after you handle raw foods.  Use separate food preparation surfaces and storage spaces for raw meat and for fruits and vegetables.  Keep refrigerated foods colder than 20F (5C).  Serve hot foods right  away or keep them heated above 120F (60C).  Store dry foods in cool, dry spaces. Keep them away from too much heat or moisture.  Throw out any foods that: ? Do not smell right. ? Are in cans that are bulging.  Follow approved canning procedures.  Heat canned foods fully before you taste them.  Drink bottled or sterile water when you travel. Get help right away if: Call 911 or go to the emergency room if:  You have trouble: ? Breathing. ? Swallowing. ? Talking. ? Moving.  You have blurry vision.  You cannot eat or drink without throwing up (vomiting).  You pass out (faint).  Your eyes turn yellow.  You continue to throw up or have watery poop (diarrhea).  You start to have pain in your belly (abdomen), your belly pain gets worse, or your belly pain stays in one small area.  You have a fever.  You have blood or mucus in your poop (stools), or your poop looks dark black and tarry.  You have signs of dehydration, such as: ? Dark pee, very little pee, or no pee. ? Cracked lips. ? Not making tears while crying. ? Dry mouth. ? Sunken eyes. ? Sleepiness. ? Weakness. ? Dizziness.  This information is not intended to replace advice given to you by your health care provider. Make sure you discuss any questions you have with your health care provider. Document Released: 08/24/2009 Document Revised: 08/12/2015 Document Reviewed: 09/07/2014 Elsevier Interactive Patient Education  2018 [place] Diet A bland diet consists of  foods that do not have a lot of fat or fiber. Foods without fat or fiber are easier for the body to digest. They are also less likely to irritate your mouth, throat, stomach, and other parts of your gastrointestinal tract. A bland diet is sometimes called a BRAT diet. What is my plan? Your health care provider or dietitian may recommend specific changes to your diet to prevent and treat your symptoms, such as:  Eating small meals  often.  Cooking food until it is soft enough to chew easily.  Chewing your food well.  Drinking fluids slowly.  Not eating foods that are very spicy, sour, or fatty.  Not eating citrus fruits, such as oranges and grapefruit.  What do I need to know about this diet?  Eat a variety of foods from the bland diet food list.  Do not follow a bland diet longer than you have to.  Ask your health care provider whether you should take vitamins. What foods can I eat? Grains  Hot cereals, such as cream of wheat. Bread, crackers, or tortillas made from refined white flour. Rice. Vegetables Canned or cooked vegetables. Mashed or boiled potatoes. Fruits Bananas. Applesauce. Other types of cooked or canned fruit with the skin and seeds removed, such as canned peaches or pears. Meats and Other Protein Sources Scrambled eggs. Creamy peanut butter or other nut butters. Lean, well-cooked meats, such as chicken or fish. Tofu. Soups or broths. Dairy Low-fat dairy products, such as milk, cottage cheese, or yogurt. Beverages Water. Herbal tea. Apple juice. Sweets and Desserts Pudding. Custard. Fruit gelatin. Ice cream. Fats and Oils Mild salad dressings. Canola or olive oil. The items listed above may not be a complete list of allowed foods or beverages. Contact your dietitian for more options. What foods are not recommended? Foods and ingredients that are often not recommended include:  Spicy foods, such as hot sauce or salsa.  Fried foods.  Sour foods, such as pickled or fermented foods.  Raw vegetables or fruits, especially citrus or berries.  Caffeinated drinks.  Alcohol.  Strongly flavored seasonings or condiments.  The items listed above may not be a complete list of foods and beverages that are not allowed. Contact your dietitian for more information. This information is not intended to replace advice given to you by your health care provider. Make sure you discuss any  questions you have with your health care provider. Document Released: 06/28/2015 Document Revised: 08/12/2015 Document Reviewed: 03/18/2014 Elsevier Interactive Patient Education  2018 [employer].

## 2017-01-09 NOTE — MAU Provider Note (Signed)
History     CSN: [id number]  Arrival date and time: 01/09/17 7793   First Provider Initiated Contact with Patient 01/09/17 1049      Chief Complaint  Patient presents with  . Abdominal Pain  . Emesis   G2P0010 @21 .0 wks here with N/V and upper abdominal pain. Sx started around 0230. Pain is bilateral, intermittent, in upper quadrants. She is unable to tolerate anything po. Denies diarrhea. Did not check temp but felt hot earlier. Denies VB or LAP. Her boyfriend had diarrhea around the same time. They both ate [employer] around 2030 last night.    OB History    Gravida Para Term Preterm AB Living   2       1     SAB TAB Ectopic Multiple Live Births   1              Past Medical History:  Diagnosis Date  . Allergy    sinus   . Asthma   . Headache    migraines  . Ovarian cyst     Past Surgical History:  Procedure Laterality Date  . CHOLECYSTECTOMY  06/28/12   lap chole  . CHOLECYSTECTOMY      Family History  Problem Relation Age of Onset  . Cancer Maternal Grandmother        breast  . Diabetes Maternal Grandmother   . Hypertension Maternal Grandmother   . Cancer Maternal Grandfather        panreatic  . Diabetes Maternal Grandfather   . Migraines Mother   . Cerebral aneurysm Other     Social History  Substance Use Topics  . Smoking status: Never Smoker  . Smokeless tobacco: Never Used  . Alcohol use Yes     Comment: social (a few times/year)none since pregnancy    Allergies:  Allergies  Allergen Reactions  . Latex Other (See Comments)    Itches, breaks my hands out  . Citric Acid Rash    Breakouts on chest and back.    Prescriptions Prior to Admission  Medication Sig Dispense Refill Last Dose  . BONJESTA 20-20 MG TBCR Take 1 tablet by mouth at bedtime as needed (for nausea).   3 01/08/2017 at Unknown time  . fluticasone (FLONASE) 50 MCG/ACT nasal spray PLACE 2 SPRAYS INTO BOTH NOSTRILS DAILY. 12.5 g 2 Past Week at Unknown time  . pantoprazole  (PROTONIX) 40 MG tablet TAKE 1 TABLET BY MOUTH EVERY DAY 30 tablet 0 Past Week at Unknown time  . Prenat w/o A Vit-FeFum-FePo-FA (PROVIDA OB) 20-20-1.25 MG CAPS Take 1 capsule by mouth at bedtime.   6 01/08/2017 at Unknown time  . promethazine (PHENERGAN) 12.5 MG tablet Take 1 tablet (12.5 mg total) by mouth every 6 (six) hours as needed for nausea or vomiting. 30 tablet 0 Past Week at Unknown time  . albuterol (PROVENTIL HFA;VENTOLIN HFA) 108 (90 BASE) MCG/ACT inhaler Inhale 2 puffs into the lungs every 6 (six) hours as needed for wheezing or shortness of breath. 1 Inhaler 0 rescue    Review of Systems  Constitutional: Positive for chills. Negative for fever.  Gastrointestinal: Positive for abdominal pain, nausea and vomiting. Negative for constipation and diarrhea.  Genitourinary: Negative for vaginal bleeding.   Physical Exam   Blood pressure 114/69, pulse 95, temperature 98.2 F (36.8 C), temperature source Oral, resp. rate 18, height 5\' 6"  (1.676 m), weight 195 lb (88.5 kg), last menstrual period 08/15/2016, SpO2 99 %.  Physical Exam  Constitutional: She  is oriented to person, place, and time. She appears well-developed and well-nourished. No distress.  HENT:  Head: Normocephalic and atraumatic.  Neck: Normal range of motion.  Respiratory: Effort normal. No respiratory distress.  GI: Soft. She exhibits no distension and no mass. There is tenderness in the right upper quadrant, epigastric area and left upper quadrant. There is no rebound and no guarding.  Musculoskeletal: Normal range of motion.  Neurological: She is alert and oriented to person, place, and time.  Skin: Skin is warm and dry.  Psychiatric: She has a normal mood and affect.   Results for orders placed or performed during the hospital encounter of 01/09/17 (from the past 24 hour(s))  Urinalysis, Routine w reflex microscopic     Status: None   Collection Time: 01/09/17  9:55 AM  Result Value Ref Range   Color, Urine  YELLOW YELLOW   APPearance CLEAR CLEAR   Specific Gravity, Urine 1.020 1.005 - 1.030   pH 7.0 5.0 - 8.0   Glucose, UA NEGATIVE NEGATIVE mg/dL   Hgb urine dipstick NEGATIVE NEGATIVE   Bilirubin Urine NEGATIVE NEGATIVE   Ketones, ur NEGATIVE NEGATIVE mg/dL   Protein, ur NEGATIVE NEGATIVE mg/dL   Nitrite NEGATIVE NEGATIVE   Leukocytes, UA NEGATIVE NEGATIVE  CBC     Status: Abnormal   Collection Time: 01/09/17 10:59 AM  Result Value Ref Range   WBC 7.8 4.0 - 10.5 K/uL   RBC 3.91 3.87 - 5.11 MIL/uL   Hemoglobin 12.4 12.0 - 15.0 g/dL   HCT 35.8 (L) 36.0 - 46.0 %   MCV 91.6 78.0 - 100.0 fL   MCH 31.7 26.0 - 34.0 pg   MCHC 34.6 30.0 - 36.0 g/dL   RDW 13.4 11.5 - 15.5 %   Platelets 220 150 - 400 K/uL  Comprehensive metabolic panel     Status: Abnormal   Collection Time: 01/09/17 10:59 AM  Result Value Ref Range   Sodium 130 (L) 135 - 145 mmol/L   Potassium 3.9 3.5 - 5.1 mmol/L   Chloride 101 101 - 111 mmol/L   CO2 22 22 - 32 mmol/L   Glucose, Bld 96 65 - 99 mg/dL   BUN 7 6 - 20 mg/dL   Creatinine, Ser 0.58 0.44 - 1.00 mg/dL   Calcium 8.2 (L) 8.9 - 10.3 mg/dL   Total Protein 6.8 6.5 - 8.1 g/dL   Albumin 3.3 (L) 3.5 - 5.0 g/dL   AST 31 15 - 41 U/L   ALT 39 14 - 54 U/L   Alkaline Phosphatase 66 38 - 126 U/L   Total Bilirubin 0.8 0.3 - 1.2 mg/dL   GFR calc non Af Amer >60 >60 mL/min   GFR calc Af Amer >60 >60 mL/min   Anion gap 7 5 - 15  Lipase, blood     Status: None   Collection Time: 01/09/17 10:59 AM  Result Value Ref Range   Lipase 23 11 - 51 U/L   MAU Course  Procedures LR Reglan Bentyl  MDM Labs ordered and reviewed. No episodes of emesis. Pain improved. Tolerating fluid and crackers. Presentation, clinical findings, and plan discussed with Dr. Corinna Capra. Stable for discharge home.  Assessment and Plan   1. [redacted] weeks gestation of pregnancy   2. Food poisoning, accidental or unintentional, initial encounter    Discharge home Follow up on OB office as scheduled Rx  Reglan Rx Bentyl Maintain hydration BRAT diet  Allergies as of 01/09/2017      Reactions  Latex Other (See Comments)   Itches, breaks my hands out   Citric Acid Rash   Breakouts on chest and back.      Medication List    STOP taking these medications   promethazine 12.5 MG tablet Commonly known as:  PHENERGAN     TAKE these medications   albuterol 108 (90 Base) MCG/ACT inhaler Commonly known as:  PROVENTIL HFA;VENTOLIN HFA Inhale 2 puffs into the lungs every 6 (six) hours as needed for wheezing or shortness of breath.   BONJESTA 20-20 MG Tbcr Generic drug:  Doxylamine-Pyridoxine ER Take 1 tablet by mouth at bedtime as needed (for nausea).   dicyclomine 10 MG capsule Commonly known as:  BENTYL Take 1 capsule (10 mg total) by mouth 3 (three) times daily.   fluticasone 50 MCG/ACT nasal spray Commonly known as:  FLONASE PLACE 2 SPRAYS INTO BOTH NOSTRILS DAILY.   metoCLOPramide 10 MG tablet Commonly known as:  REGLAN Take 1 tablet (10 mg total) by mouth every 8 (eight) hours as needed for nausea.   pantoprazole 40 MG tablet Commonly known as:  PROTONIX TAKE 1 TABLET BY MOUTH EVERY DAY   PROVIDA OB 20-20-1.25 MG Caps Take 1 capsule by mouth at bedtime.      Julianne Handler, CNM 01/09/2017, 2:51 PM

## 2017-01-09 NOTE — MAU Note (Signed)
Pt reports upper abd pain and vomiting since 2 am. States she hasn't been able to keep anything down. No fever.

## 2017-01-22 ENCOUNTER — Inpatient Hospital Stay (HOSPITAL_COMMUNITY)
Admission: AD | Admit: 2017-01-22 | Discharge: 2017-01-24 | DRG: 833 | Disposition: A | Discharge status: Home / Self Care | Payer: BC Managed Care – PPO | Source: Ambulatory Visit | Attending: Obstetrics and Gynecology | Admitting: Obstetrics and Gynecology

## 2017-01-22 ENCOUNTER — Encounter (HOSPITAL_COMMUNITY): Payer: Self-pay | Admitting: *Deleted

## 2017-01-22 ENCOUNTER — Other Ambulatory Visit: Payer: Self-pay

## 2017-01-22 DIAGNOSIS — Z3A22 22 weeks gestation of pregnancy: Secondary | ICD-10-CM | POA: Diagnosis not present

## 2017-01-22 DIAGNOSIS — Z91040 Latex allergy status: Secondary | ICD-10-CM | POA: Diagnosis not present

## 2017-01-22 DIAGNOSIS — O26872 Cervical shortening, second trimester: Secondary | ICD-10-CM | POA: Diagnosis present

## 2017-01-22 DIAGNOSIS — Z3A23 23 weeks gestation of pregnancy: Secondary | ICD-10-CM

## 2017-01-22 LAB — CBC
HEMATOCRIT: 33.8 % — AB (ref 36.0–46.0)
HEMOGLOBIN: 11.4 g/dL — AB (ref 12.0–15.0)
MCH: 31.6 pg (ref 26.0–34.0)
MCHC: 33.7 g/dL (ref 30.0–36.0)
MCV: 93.6 fL (ref 78.0–100.0)
Platelets: 232 10*3/uL (ref 150–400)
RBC: 3.61 MIL/uL — ABNORMAL LOW (ref 3.87–5.11)
RDW: 13.1 % (ref 11.5–15.5)
WBC: 7.5 10*3/uL (ref 4.0–10.5)

## 2017-01-22 LAB — TYPE AND SCREEN
ABO/RH(D): O POS
Antibody Screen: NEGATIVE

## 2017-01-22 LAB — ABO/RH: ABO/RH(D): O POS

## 2017-01-22 MED ORDER — PRENATAL MULTIVITAMIN CH: 1.0000 | ORAL_TABLET | Freq: Every day | ORAL | Status: DC

## 2017-01-22 MED ORDER — CALCIUM CARBONATE ANTACID 500 MG PO CHEW
2.0000 | CHEWABLE_TABLET | ORAL | Status: DC | PRN
  Administered 2017-01-22: 400 mg via ORAL
  Filled 2017-01-22: qty 2

## 2017-01-22 MED ORDER — ACETAMINOPHEN 325 MG PO TABS
650.0000 mg | ORAL_TABLET | ORAL | Status: DC | PRN
  Administered 2017-01-24 (×2): 650 mg via ORAL
  Filled 2017-01-22 (×2): qty 2

## 2017-01-22 MED ORDER — DOCUSATE SODIUM 100 MG PO CAPS
100.0000 mg | ORAL_CAPSULE | Freq: Every day | ORAL | Status: DC
  Administered 2017-01-23 – 2017-01-24 (×2): 100 mg via ORAL
  Filled 2017-01-22 (×2): qty 1

## 2017-01-22 MED ORDER — ZOLPIDEM TARTRATE 5 MG PO TABS: 5.0000 mg | ORAL_TABLET | Freq: Every evening | ORAL | Status: DC | PRN

## 2017-01-22 MED ORDER — PRENATAL MULTIVITAMIN CH
1.0000 | ORAL_TABLET | Freq: Every day | ORAL | Status: DC
  Administered 2017-01-22 – 2017-01-23 (×2): 1 via ORAL
  Filled 2017-01-22 (×2): qty 1

## 2017-01-22 NOTE — Progress Notes (Signed)
Dr Corinna Capra adjusted TOCO.

## 2017-01-22 NOTE — H&P (Signed)
Lauren Leonard is a 26 y.o. female presenting for cervical change noted in office.  Was seen on 11/2 complaining of pelvic cramping.  No ctxs noted on monitor but pelvic US showed Cx thinning to 2.5cm with funneling.  She rested and increased fluids over the weekend, and states still occassional cramping.  Cx Length today 1.9-2.2cm with funneling. OB History    Gravida Para Term Preterm AB Living   2       1     SAB TAB Ectopic Multiple Live Births   1             Past Medical History:  Diagnosis Date  . Allergy    sinus   . Asthma   . Headache    migraines  . Ovarian cyst    Past Surgical History:  Procedure Laterality Date  . CHOLECYSTECTOMY  06/28/12   lap chole  . CHOLECYSTECTOMY     Family History: family history includes Cancer in her maternal grandfather and maternal grandmother; Cerebral aneurysm in her other; Diabetes in her maternal grandfather and maternal grandmother; Hypertension in her maternal grandmother; Migraines in her mother. Social History:  reports that  has never smoked. she has never used smokeless tobacco. She reports that she drinks alcohol. She reports that she does not use drugs.     Maternal Diabetes: No Genetic Screening: Normal Maternal Ultrasounds/Referrals: Normal Fetal Ultrasounds or other Referrals:  None Maternal Substance Abuse:  No Significant Maternal Medications:  None Significant Maternal Lab Results:  None Other Comments:  None  ROS History   Blood pressure 113/64, pulse 87, temperature 98.1 F (36.7 C), temperature source Oral, resp. rate 16, height 5\' 6"  (1.676 m), weight 199 lb (90.3 kg), last menstrual period 08/15/2016, SpO2 100 %. Exam Physical Exam  [place] as above Prev Pelvic exam Cl /Th/High 1 week ago Prenatal labs: ABO, Rh:   Antibody:   Rubella:   RPR:    HBsAg:    HIV:    GBS:     Assessment/Plan: IUP at 22 4/7 Cx shortening with funneling - EFM to r/o PTL.  FU with Cx length.  If continues cx change, consider  BMZ. GBS pending   Donevin Sainsbury C 01/22/2017, 4:05 PM

## 2017-01-23 ENCOUNTER — Inpatient Hospital Stay (HOSPITAL_COMMUNITY): Payer: BC Managed Care – PPO

## 2017-01-23 MED ORDER — PROGESTERONE MICRONIZED 200 MG PO CAPS
200.0000 mg | ORAL_CAPSULE | Freq: Every day | ORAL | Status: DC
  Administered 2017-01-23: 200 mg via VAGINAL
  Filled 2017-01-23: qty 1

## 2017-01-23 NOTE — Progress Notes (Signed)
23 0/7weeks No bleeding/leaking No C/O  Vss Afeb Uterus soft, NT  FHT + UC none  A/P: Short cervix with funneling         MFM U/S and consult today         D/W patient

## 2017-01-23 NOTE — Consult Note (Signed)
Maternal Fetal Medicine Consultation  Requesting Provider(s): Corinna Capra  Primary OB: Corinna Capra Reason for consultation: Short cervix  HPI: 26yo P0010 at 23+0 weeks admitted yesterday after vaginal US showed short cervix. Cervix is desribed as 19-22 mm in length with funneling. She has reported several months of cramping and pressure, but cannot be definite about how frequent these are. She has had some changes in vaginal discharge, no bleeding OB History: OB History    Gravida Para Term Preterm AB Living   2       1     SAB TAB Ectopic Multiple Live Births   1              PMH:  Past Medical History:  Diagnosis Date  . Allergy    sinus   . Asthma   . Headache    migraines  . Ovarian cyst     PSH:  Past Surgical History:  Procedure Laterality Date  . CHOLECYSTECTOMY  06/28/12   lap chole  . CHOLECYSTECTOMY     Meds: PNV Allergies: latex and citric acid FH: See EPIC section Soc: See EPIC section  Review of Systems: no vaginal bleeding or cramping/contractions, no LOF, no nausea/vomiting. All other systems reviewed and are negative.  PE:  VS: See EPIC section GEN: well-appearing female ABD: gravid, NT  A/P: Short cervix This patient  Is not really a candidate for cerclage due to her gestational age and the relatively mild shortening seen. She should be started on vaginal progesterone (either Crinone gel or micronized progesterone 200mg ) at bedtime intravaginally. I would like to have her undergo a transvaginal cervical length tomorrow AM. If stable she can be managed as an outpatient. I would like to see her at 24 and 25 weeks for cervical lengths as well, and she will need to be off work through that third cervical evaluation, at least  Thank you for the opportunity to be a part of the care of [employer]. Please contact our office if we can be of further assistance.   I spent approximately 20 minutes with this patient with over 50% of time spent in face-to-face  counseling.

## 2017-01-24 ENCOUNTER — Inpatient Hospital Stay (HOSPITAL_COMMUNITY): Payer: BC Managed Care – PPO

## 2017-01-24 LAB — CULTURE, BETA STREP (GROUP B ONLY)

## 2017-01-24 MED ORDER — PROGESTERONE MICRONIZED 200 MG PO CAPS: 200.0000 mg | ORAL_CAPSULE | Freq: Every day | ORAL | 4 refills | Status: DC

## 2017-01-24 NOTE — Progress Notes (Signed)
Ultrasound results were reviewed with Dr. Burnett Harry who reported stable cervical length.  She is cleared to be discharged home with follow up u/s in 1 and 2 weeks for cervical length, continued vaginal progesterone, and bed rest.   Linda Hedges, DO

## 2017-01-24 NOTE — Discharge Summary (Signed)
OB Discharge Summary     Patient Name: Lauren Leonard DOB: 05-28-1990 MRN: [id number]  Date of admission: 01/22/2017 Delivering MD: This patient has no babies on file.  Date of discharge: 01/24/2017  Admitting diagnosis: 23WKS, SHORT CERVIX Intrauterine pregnancy: [redacted]w[redacted]d     Secondary diagnosis:  Active Problems:   Short cervix during pregnancy in second trimester  Additional problems: none     Discharge diagnosis: short cervix                                                                                                Post partum procedures:n/a  Augmentation: n/a  Complications: None  Hospital course:  The patient was admitted on 11/5 secondary to shortened cervical length noted on office ultrasound.  She was found to have no CTX and MFM consult was performed on HD2.  Cervical length was stable x 2 days and the patient was started on vaginal progesterone.  Given her stability in cervical length, the patient was discharged home on [facility] with close follow up planned with cervical length u/s in 1 and 2 weeks.   Physical exam  Vitals:   01/23/17 2312 01/24/17 0800 01/24/17 1200 01/24/17 1600  BP: 103/61 (!) 102/57 (!) 104/55 (!) 104/59  Pulse: 80 77 83 84  Resp: 16 20 18 18   Temp: 98.7 F (37.1 C) 98.4 F (36.9 C) 98.6 F (37 C) 98.4 F (36.9 C)  TempSrc: Oral Oral Oral Oral  SpO2: 98% 100% 100% 100%  Weight:      Height:       General: alert, cooperative and no distress Lochia: n/a Uterine Fundus: soft Incision: N/A DVT Evaluation: No evidence of DVT seen on physical exam. Labs: Lab Results  Component Value Date   WBC 7.5 01/22/2017   HGB 11.4 (L) 01/22/2017   HCT 33.8 (L) 01/22/2017   MCV 93.6 01/22/2017   PLT 232 01/22/2017   CMP Latest Ref Rng & Units 01/09/2017  Glucose 65 - 99 mg/dL 96  BUN 6 - 20 mg/dL 7  Creatinine 0.44 - 1.00 mg/dL 0.58  Sodium 135 - 145 mmol/L 130(L)  Potassium 3.5 - 5.1 mmol/L 3.9  Chloride 101 - 111 mmol/L 101  CO2 22 - 32  mmol/L 22  Calcium 8.9 - 10.3 mg/dL 8.2(L)  Total Protein 6.5 - 8.1 g/dL 6.8  Total Bilirubin 0.3 - 1.2 mg/dL 0.8  Alkaline Phos 38 - 126 U/L 66  AST 15 - 41 U/L 31  ALT 14 - 54 U/L 39    Discharge instruction: per After Visit Summary and "Baby and Me Booklet".  After visit meds:  Allergies as of 01/24/2017      Reactions   Latex Other (See Comments)   Itches, breaks my hands out   Citric Acid Rash   Breakouts on chest and back.      Medication List    TAKE these medications   albuterol 108 (90 Base) MCG/ACT inhaler Commonly known as:  PROVENTIL HFA;VENTOLIN HFA Inhale 2 puffs into the lungs every 6 (six) hours as needed for wheezing or shortness of breath.  BONJESTA 20-20 MG Tbcr Generic drug:  Doxylamine-Pyridoxine ER Take 1 tablet by mouth at bedtime as needed (for nausea).   dicyclomine 10 MG capsule Commonly known as:  BENTYL Take 1 capsule (10 mg total) by mouth 3 (three) times daily.   fluticasone 50 MCG/ACT nasal spray Commonly known as:  FLONASE PLACE 2 SPRAYS INTO BOTH NOSTRILS DAILY.   metoCLOPramide 10 MG tablet Commonly known as:  REGLAN Take 1 tablet (10 mg total) by mouth every 8 (eight) hours as needed for nausea.   pantoprazole 40 MG tablet Commonly known as:  PROTONIX TAKE 1 TABLET BY MOUTH EVERY DAY   progesterone 200 MG capsule Commonly known as:  PROMETRIUM Place 1 capsule (200 mg total) at bedtime vaginally.   PROVIDA OB 20-20-1.25 MG Caps Take 1 capsule by mouth at bedtime.       Diet: routine diet  Activity: Advance as tolerated. Pelvic rest for 6 weeks.   Outpatient follow up:1 week Follow up Appt:No future appointments. Follow up Visit:No Follow-up on file.  Postpartum contraception: n/a  Newborn Data: This patient has no babies on file. Baby Feeding: n/a Disposition:n/a   01/24/2017 Laquitha Heslin, DO

## 2017-01-24 NOTE — Progress Notes (Signed)
Discharge teaching complete with pt. Pt understood all instructions and did not have any questions. Pt to follow up with OB office.

## 2017-01-24 NOTE — Progress Notes (Signed)
Mild cramping this am; none now.  +FM.  No LOF.  VSS. AF. Ultrasound for CL this am report pending  26yo G2P0010 at [redacted]w[redacted]d with shortened cervix -vaginal progesterone -awaiting MFM final report; likely d/c home with close u/s f/u  Linda Hedges, DO

## 2017-01-28 ENCOUNTER — Inpatient Hospital Stay (HOSPITAL_COMMUNITY)
Admission: AD | Admit: 2017-01-28 | Discharge: 2017-01-28 | Disposition: A | Discharge status: Home / Self Care | Payer: BC Managed Care – PPO | Source: Ambulatory Visit | Attending: Obstetrics and Gynecology | Admitting: Obstetrics and Gynecology

## 2017-01-28 ENCOUNTER — Encounter (HOSPITAL_COMMUNITY): Payer: Self-pay | Admitting: *Deleted

## 2017-01-28 ENCOUNTER — Inpatient Hospital Stay (HOSPITAL_COMMUNITY): Payer: BC Managed Care – PPO

## 2017-01-28 ENCOUNTER — Other Ambulatory Visit: Payer: Self-pay

## 2017-01-28 DIAGNOSIS — R109 Unspecified abdominal pain: Secondary | ICD-10-CM

## 2017-01-28 DIAGNOSIS — O26892 Other specified pregnancy related conditions, second trimester: Secondary | ICD-10-CM | POA: Diagnosis not present

## 2017-01-28 DIAGNOSIS — O26872 Cervical shortening, second trimester: Secondary | ICD-10-CM | POA: Diagnosis not present

## 2017-01-28 DIAGNOSIS — R1030 Lower abdominal pain, unspecified: Secondary | ICD-10-CM | POA: Diagnosis present

## 2017-01-28 DIAGNOSIS — Z3A23 23 weeks gestation of pregnancy: Secondary | ICD-10-CM | POA: Diagnosis not present

## 2017-01-28 DIAGNOSIS — L509 Urticaria, unspecified: Secondary | ICD-10-CM

## 2017-01-28 DIAGNOSIS — O26899 Other specified pregnancy related conditions, unspecified trimester: Secondary | ICD-10-CM

## 2017-01-28 LAB — URINALYSIS, ROUTINE W REFLEX MICROSCOPIC
BILIRUBIN URINE: NEGATIVE
Glucose, UA: NEGATIVE mg/dL
HGB URINE DIPSTICK: NEGATIVE
KETONES UR: NEGATIVE mg/dL
Nitrite: NEGATIVE
PH: 7 (ref 5.0–8.0)
Protein, ur: NEGATIVE mg/dL
Specific Gravity, Urine: 1.019 (ref 1.005–1.030)

## 2017-01-28 MED ORDER — TERBUTALINE SULFATE 1 MG/ML IJ SOLN
0.2500 mg | Freq: Once | INTRAMUSCULAR | Status: AC
  Administered 2017-01-28: 0.25 mg via SUBCUTANEOUS
  Filled 2017-01-28: qty 1

## 2017-01-28 NOTE — Discharge Instructions (Signed)
Hives Hives (urticaria) are itchy, red, swollen areas on your skin. Hives can appear on any part of your body and can vary in size. They can be as small as the tip of a pen or much larger. Hives often fade within 24 hours (acute hives). In other cases, new hives appear after old ones fade. This cycle can continue for several days or weeks (chronic hives). Hives result from your body's reaction to an irritant or to something that you are allergic to (trigger). When you are exposed to a trigger, your body releases a chemical (histamine) that causes redness, itching, and swelling. You can get hives immediately after being exposed to a trigger or hours later. Hives do not spread from person to person (are not contagious). Your hives may get worse with scratching, exercise, and emotional stress. What are the causes? Causes of this condition include:  Allergies to certain foods or ingredients.  Insect bites or stings.  Exposure to pollen or pet dander.  Contact with latex or chemicals.  Spending time in sunlight, heat, or cold (exposure).  Exercise.  Stress. You can also get hives from some medical conditions and treatments. These include:  Viruses, including the common cold.  Bacterial infections, such as urinary tract infections and strep throat.  Disorders such as vasculitis, lupus, or thyroid disease.  Certain medications.  Allergy shots.  Blood transfusions. Sometimes, the cause of hives is not known (idiopathic hives). What increases the risk? This condition is more likely to develop in:  Women.  People who have food allergies, especially to citrus fruits, milk, eggs, peanuts, tree nuts, or shellfish.  People who are allergic to:  Medicines.  Latex.  Insects.  Animals.  Pollen.  People who have certain medical conditions, includinglupus or thyroid disease. What are the signs or symptoms? The main symptom of this condition is raised, itchyred or white bumps or  patches on your skin. These areas may:  Become large and swollen (welts).  Change in shape and location, quickly and repeatedly.  Be separate hives or connect over a large area of skin.  Sting or become painful.  Turn white when pressed in the center (blanch). In severe cases, yourhands, feet, and face may also become swollen. This may occur if hives develop deeper in your skin. How is this diagnosed? This condition is diagnosed based on your symptoms, medical history, and physical exam. Your skin, urine, or blood may be tested to find out what is causing your hives and to rule out other health issues. Your health care provider may also remove a small sample of skin from the affected area and examine it under a microscope (biopsy). How is this treated? Treatment depends on the severity of your condition. Your health care provider may recommend using cool, wet cloths (cool compresses) or taking cool showers to relieve itching. Hives are sometimes treated with medicines, including:  Antihistamines.  Corticosteroids.  Antibiotics.  An injectable medicine (omalizumab). Your health care provider may prescribe this if you have chronic idiopathic hives and you continue to have symptoms even after treatment with antihistamines. Severe cases may require an emergency injection of adrenaline (epinephrine) to prevent a life-threatening allergic reaction (anaphylaxis). Follow these instructions at home: Medicines  Take or apply over-the-counter and prescription medicines only as told by your health care provider.  If you were prescribed an antibiotic medicine, use it as told by your health care provider. Do not stop taking the antibiotic even if you start to feel better. Skin Care    Apply cool compresses to the affected areas.  Do not scratch or rub your skin. General instructions  Do not take hot showers or baths. This can make itching worse.  Do not wear tight-fitting clothing.  Use  sunscreen and wear protective clothing when you are outside.  Avoid any substances that cause your hives. Keep a journal to help you track what causes your hives. Write down:  What medicines you take.  What you eat and drink.  What products you use on your skin.  Keep all follow-up visits as told by your health care provider. This is important. Contact a health care provider if:  Your symptoms are not controlled with medicine.  Your joints are painful or swollen. Get help right away if:  You have a fever.  You have pain in your abdomen.  Your tongue or lips are swollen.  Your eyelids are swollen.  Your chest or throat feels tight.  You have trouble breathing or swallowing. These symptoms may represent a serious problem that is an emergency. Do not wait to see if the symptoms will go away. Get medical help right away. Call your local emergency services (911 in the [place] Do not drive yourself to the hospital.  This information is not intended to replace advice given to you by your health care provider. Make sure you discuss any questions you have with your health care provider. Document Released: 03/06/2005 Document Revised: 08/04/2015 Document Reviewed: 12/23/2014 Elsevier Interactive Patient Education  2017 [employer]  

## 2017-01-28 NOTE — MAU Note (Signed)
Pt presents with c/o red bumps on face & whelps to right upper arm & left thigh.  Pt reports noticed red bumps to face Monday, whelps to arm today & leg last night.  Pt denies changing detergents or using any new product such as perfume.  Pt reports took Benadryl x1 today and has tried Hydrocortisone cream this afternoon.

## 2017-01-28 NOTE — MAU Provider Note (Signed)
Chief Complaint:  welps   First Provider Initiated Contact with Patient 01/28/17 1518     HPI: Lauren Leonard is a 26 y.o. G2P0010 at [redacted]w[redacted]d who presents to maternity admissions reporting skin breakout and hives and low abd cramping.  Hives: Reports skin of face "breaking out" 01/22/17. Described as "a few dots with white in them". Yesterday, 01/27/17 noticed "welps" on upper right arm and left thigh.  Location: Right arm, left thigh Duration: 24 hours Context: Only change in her usual routine is starting vaginal progesterone 01/23/17 Modifying factors: Improved w/ Benadryl Associated signs and symptoms: Neg for fever, chills, swelling of lips or tongue. Had slight SOB w/ exertion Friday night (01/26/17) that quickly resolved on its own.  Cramping: Location: Suprapubic Duration: several months Course: some worsening over the past few weeks.  Intensity: Moderate Context: Second trimester of pregnancy w/ slight cervical shortening.  Modifying factors: There are no aggravating or aleviating factors. Hasn't tried anything for the pain. Is on Vaginal progesterone for short cervix. Associated signs and symptoms: Neg for fever, chills, VB, LOF, urinary complaints, GO complaints. Good fetal movement.   Was admitted to [facility] 01/22/17-01/24/17 for short cervix.   TVUS 01/19/17: 2.5 cm w/ funneling 01/22/17: 1.9-2.2cm with funneling 01/23/17: 33mm with no funneling  01/24/17: dynamic, mildly funneling internal os with distal closed portion measuring 2.1 - 2.6 cms; no significant change from yesterday's images  Past Medical History:  Diagnosis Date  . Allergy    sinus   . Asthma   . Headache    migraines  . Ovarian cyst    OB History  Gravida Para Term Preterm AB Living  2       1    SAB TAB Ectopic Multiple Live Births  1            # Outcome Date GA Lbr Len/2nd Weight Sex Delivery Anes PTL Lv  2 Current           1 SAB 2013 [redacted]w[redacted]d            Birth Comments: System Generated. Please review and  update pregnancy details.     Past Surgical History:  Procedure Laterality Date  . CHOLECYSTECTOMY  06/28/12   lap chole  . CHOLECYSTECTOMY     Family History  Problem Relation Age of Onset  . Cancer Maternal Grandmother        breast  . Diabetes Maternal Grandmother   . Hypertension Maternal Grandmother   . Cancer Maternal Grandfather        panreatic  . Diabetes Maternal Grandfather   . Migraines Mother   . Hypertension Mother   . Cerebral aneurysm Other    Social History   Tobacco Use  . Smoking status: Never Smoker  . Smokeless tobacco: Never Used  Substance Use Topics  . Alcohol use: Yes    Comment: social (a few times/year)none since pregnancy  . Drug use: No   Allergies  Allergen Reactions  . Latex Other (See Comments)    Itches, breaks my hands out  . Citric Acid Rash    Breakouts on chest and back.   Medications Prior to Admission  Medication Sig Dispense Refill Last Dose  . acetaminophen (TYLENOL) 500 MG tablet Take 1,000 mg every 6 (six) hours as needed by mouth for mild pain.   Past Week at Unknown time  . BONJESTA 20-20 MG TBCR Take 1 tablet by mouth at bedtime as needed (for nausea).   3 01/27/2017 at Unknown  time  . cetirizine (ZYRTEC) 10 MG tablet Take 10 mg at bedtime by mouth.   01/27/2017 at Unknown time  . dicyclomine (BENTYL) 10 MG capsule Take 1 capsule (10 mg total) by mouth 3 (three) times daily. 10 capsule 0 Past Month at Unknown time  . diphenhydrAMINE (BENADRYL) 25 MG tablet Take 25 mg every 6 (six) hours as needed by mouth for itching (For rash.).   01/28/2017 at Unknown time  . fluticasone (FLONASE) 50 MCG/ACT nasal spray PLACE 2 SPRAYS INTO BOTH NOSTRILS DAILY. 12.5 g 2 01/28/2017 at Unknown time  . hydrocortisone cream 1 % Apply 1 application 2 (two) times daily as needed topically (For rash.).   01/28/2017 at Unknown time  . metoCLOPramide (REGLAN) 10 MG tablet Take 1 tablet (10 mg total) by mouth every 8 (eight) hours as needed for  nausea. 20 tablet 0 01/27/2017 at Unknown time  . Multiple Vitamin (MULTIVITAMIN WITH MINERALS) TABS tablet Take 1 tablet at bedtime by mouth.   01/27/2017 at Unknown time  . pantoprazole (PROTONIX) 40 MG tablet TAKE 1 TABLET BY MOUTH EVERY DAY 30 tablet 0 Past Month at Unknown time  . Prenatal Vit-Fe Fumarate-FA (PRENATAL MULTIVITAMIN) TABS tablet Take 1 tablet at bedtime by mouth.   01/27/2017 at Unknown time  . progesterone (PROMETRIUM) 200 MG capsule Place 1 capsule (200 mg total) at bedtime vaginally. 30 capsule 4 01/27/2017 at Unknown time  . promethazine (PHENERGAN) 12.5 MG tablet Take 12.5 mg every 6 (six) hours as needed by mouth for nausea or vomiting.   Past Week at Unknown time  . ranitidine (ZANTAC) 300 MG capsule Take 300 mg 2 (two) times daily as needed by mouth for heartburn.   01/27/2017 at Unknown time  . albuterol (PROVENTIL HFA;VENTOLIN HFA) 108 (90 BASE) MCG/ACT inhaler Inhale 2 puffs into the lungs every 6 (six) hours as needed for wheezing or shortness of breath. 1 Inhaler 0 Rescue    I have reviewed patient's Past Medical Hx, Surgical Hx, Family Hx, Social Hx, medications and allergies.   ROS:  Review of Systems  Constitutional: Negative for chills and fever.  HENT: Negative for facial swelling and trouble swallowing.   Respiratory: Negative for cough, chest tightness, shortness of breath and wheezing.   Gastrointestinal: Positive for abdominal pain. Negative for constipation, diarrhea, nausea and vomiting.  Genitourinary: Positive for pelvic pain. Negative for dysuria, flank pain, frequency, hematuria, urgency, vaginal bleeding, vaginal discharge and vaginal pain.  Musculoskeletal: Negative for back pain.  Skin: Positive for rash.    Physical Exam   Patient Vitals for the past 24 hrs:  BP Temp Temp src Pulse Resp SpO2  01/28/17 1832 (!) 112/56 98.1 F (36.7 C) Oral 87 - 100 %  01/28/17 1636 - - - 84 - 97 %  01/28/17 1441 111/63 97.7 F (36.5 C) Oral 91 20 98 %    Constitutional: Well-developed, well-nourished female in no acute distress.  Head: No edema of lips or tongue. 1 lesion on right cheek C/W acne Skin: Jives on right and left upper and lower arms, left thigh and right lower leg. Pattern not C/W exposed areas or contact w/ clothing. Cardiovascular: normal rate Respiratory: normal effort. Normal WOB GI: Abd soft, non-tender, gravid appropriate for gestational age.  MS: Extremities nontender, no edema, normal ROM Neurologic: Alert and oriented x 4.  GU: Neg CVAT.  Pelvic: NEFG, physiologic discharge, no blood.  Dilation: Closed Effacement (%): 50 Station: Ballotable Exam by:: Marlou Porch, CNM  FHT:  Baseline  140 , moderate variability, accelerations present, no decelerations Contractions: None   Labs: Results for orders placed or performed during the hospital encounter of 01/28/17 (from the past 24 hour(s))  Urinalysis, Routine w reflex microscopic     Status: Abnormal   Collection Time: 01/28/17  2:35 PM  Result Value Ref Range   Color, Urine YELLOW YELLOW   APPearance CLOUDY (A) CLEAR   Specific Gravity, Urine 1.019 1.005 - 1.030   pH 7.0 5.0 - 8.0   Glucose, UA NEGATIVE NEGATIVE mg/dL   Hgb urine dipstick NEGATIVE NEGATIVE   Bilirubin Urine NEGATIVE NEGATIVE   Ketones, ur NEGATIVE NEGATIVE mg/dL   Protein, ur NEGATIVE NEGATIVE mg/dL   Nitrite NEGATIVE NEGATIVE   Leukocytes, UA SMALL (A) NEGATIVE   RBC / HPF 6-30 0 - 5 RBC/hpf   WBC, UA 6-30 0 - 5 WBC/hpf   Bacteria, UA MANY (A) NONE SEEN   Squamous Epithelial / LPF 6-30 (A) NONE SEEN   Mucus PRESENT    Budding Yeast PRESENT     Imaging:  CL 2.4 cm   MAU Course: Orders Placed This Encounter  Procedures  . Urine Culture  . [place] MFM OB Transvaginal  . Urinalysis, Routine w reflex microscopic  . Discharge patient   Meds ordered this encounter  Medications  . terbutaline (BRETHINE) injection 0.25 mg   Discussed Hx, labs, exam w/ Dr. Matthew Saras. Agrees w/ POC. New  orders: Transvaginal US for CL, Terbutaline. May use Benadryl PO or gel and cortisone cream for hives.    MDM: - Hives of unknown etiology. No evidence of emergent allergic reactions. Will Tx w/ Benadryl and Cortisone cream. Reviewed Sx of severe allergic reaction and recommended w/ U at ED PRN. If no improvement in ~4 days OB will refer her to [facility]. Doubt vaginal Progesterone is cause of hives. Instructed pt to continue.  - Cramping w/ slightly shortened CL, stable from previous US. Continue Progesterone.   Assessment: 1. Hives of unknown origin   2. Cramping affecting pregnancy, antepartum   3. Abdominal cramping affecting pregnancy, antepartum   4. Cervical shortening affecting pregnancy in second trimester   5. Short cervix during pregnancy in second trimester     Plan: Discharge home in stable condition.  Preterm Labor precautions and fetal kick counts Follow-up Information    [facility] Follow up.   Why:  as scheduled or sooner as needed if symptoms worsen Contact information: [place] [address] [phone]        [facility]MISS[facility] Follow up.   Why:  in pregnancy emergencies Contact information: [address] [id number] [place] [facility] Follow up.   Specialty:  Emergency Medicine Why:  for symptoms of emergent allergic reaction (Swelling of lips, tongue, throat, difficulty breathing) Contact information: [address] [id number] [place] [phone]          Allergies as of 01/28/2017      Reactions   Latex Other (See Comments)   Itches, breaks my hands out   Citric Acid Rash   Breakouts on chest and back.      Medication List    STOP taking these medications   multivitamin with minerals Tabs tablet     TAKE these  medications   acetaminophen 500 MG tablet Commonly known as:  TYLENOL Take 1,000 mg  every 6 (six) hours as needed by mouth for mild pain.   albuterol 108 (90 Base) MCG/ACT inhaler Commonly known as:  PROVENTIL HFA;VENTOLIN HFA Inhale 2 puffs into the lungs every 6 (six) hours as needed for wheezing or shortness of breath.   BONJESTA 20-20 MG Tbcr Generic drug:  Doxylamine-Pyridoxine ER Take 1 tablet by mouth at bedtime as needed (for nausea).   cetirizine 10 MG tablet Commonly known as:  ZYRTEC Take 10 mg at bedtime by mouth.   dicyclomine 10 MG capsule Commonly known as:  BENTYL Take 1 capsule (10 mg total) by mouth 3 (three) times daily.   diphenhydrAMINE 25 MG tablet Commonly known as:  BENADRYL Take 25 mg every 6 (six) hours as needed by mouth for itching (For rash.).   fluticasone 50 MCG/ACT nasal spray Commonly known as:  FLONASE PLACE 2 SPRAYS INTO BOTH NOSTRILS DAILY.   hydrocortisone cream 1 % Apply 1 application 2 (two) times daily as needed topically (For rash.).   metoCLOPramide 10 MG tablet Commonly known as:  REGLAN Take 1 tablet (10 mg total) by mouth every 8 (eight) hours as needed for nausea.   pantoprazole 40 MG tablet Commonly known as:  PROTONIX TAKE 1 TABLET BY MOUTH EVERY DAY   prenatal multivitamin Tabs tablet Take 1 tablet at bedtime by mouth.   progesterone 200 MG capsule Commonly known as:  PROMETRIUM Place 1 capsule (200 mg total) at bedtime vaginally.   promethazine 12.5 MG tablet Commonly known as:  PHENERGAN Take 12.5 mg every 6 (six) hours as needed by mouth for nausea or vomiting.   ranitidine 300 MG capsule Commonly known as:  ZANTAC Take 300 mg 2 (two) times daily as needed by mouth for heartburn.       Tamala Julian, [place], [place] 01/28/2017 6:51 PM

## 2017-01-29 LAB — URINE CULTURE
CULTURE: NO GROWTH
SPECIAL REQUESTS: NORMAL

## 2017-03-18 ENCOUNTER — Inpatient Hospital Stay (HOSPITAL_COMMUNITY)
Admission: AD | Admit: 2017-03-18 | Discharge: 2017-03-18 | Disposition: A | Discharge status: Home / Self Care | Payer: BC Managed Care – PPO | Source: Ambulatory Visit | Attending: Obstetrics & Gynecology | Admitting: Obstetrics & Gynecology

## 2017-03-18 ENCOUNTER — Encounter (HOSPITAL_COMMUNITY): Payer: Self-pay | Admitting: *Deleted

## 2017-03-18 DIAGNOSIS — Z8249 Family history of ischemic heart disease and other diseases of the circulatory system: Secondary | ICD-10-CM | POA: Insufficient documentation

## 2017-03-18 DIAGNOSIS — Z91040 Latex allergy status: Secondary | ICD-10-CM | POA: Diagnosis not present

## 2017-03-18 DIAGNOSIS — O368130 Decreased fetal movements, third trimester, not applicable or unspecified: Secondary | ICD-10-CM | POA: Insufficient documentation

## 2017-03-18 DIAGNOSIS — R102 Pelvic and perineal pain: Secondary | ICD-10-CM | POA: Diagnosis not present

## 2017-03-18 DIAGNOSIS — Z9049 Acquired absence of other specified parts of digestive tract: Secondary | ICD-10-CM | POA: Diagnosis not present

## 2017-03-18 DIAGNOSIS — R109 Unspecified abdominal pain: Secondary | ICD-10-CM | POA: Diagnosis present

## 2017-03-18 DIAGNOSIS — O99513 Diseases of the respiratory system complicating pregnancy, third trimester: Secondary | ICD-10-CM | POA: Diagnosis not present

## 2017-03-18 DIAGNOSIS — J45909 Unspecified asthma, uncomplicated: Secondary | ICD-10-CM | POA: Diagnosis not present

## 2017-03-18 DIAGNOSIS — O26893 Other specified pregnancy related conditions, third trimester: Secondary | ICD-10-CM | POA: Diagnosis not present

## 2017-03-18 DIAGNOSIS — Z3A30 30 weeks gestation of pregnancy: Secondary | ICD-10-CM | POA: Diagnosis not present

## 2017-03-18 LAB — URINALYSIS, ROUTINE W REFLEX MICROSCOPIC
Bilirubin Urine: NEGATIVE
Glucose, UA: NEGATIVE mg/dL
Hgb urine dipstick: NEGATIVE
Ketones, ur: NEGATIVE mg/dL
Nitrite: NEGATIVE
Protein, ur: NEGATIVE mg/dL
Specific Gravity, Urine: 1.008 (ref 1.005–1.030)
pH: 7 (ref 5.0–8.0)

## 2017-03-18 LAB — WET PREP, GENITAL
CLUE CELLS WET PREP: NONE SEEN
Sperm: NONE SEEN
Trich, Wet Prep: NONE SEEN
Yeast Wet Prep HPF POC: NONE SEEN

## 2017-03-18 MED ORDER — COMFORT FIT MATERNITY SUPP LG MISC: 1.0000 | Freq: Every day | 0 refills | Status: DC

## 2017-03-18 NOTE — MAU Provider Note (Signed)
Chief Complaint:  Decreased Fetal Movement and Abdominal Pain   First Provider Initiated Contact with Patient 03/18/17 1851      HPI: Lauren Leonard is a 26 y.o. G2P0010 at [redacted]w[redacted]d who presents to maternity admissions reporting abdominal pain and decreased fetal movement. She reports abdominal pain as sharp shooting pain from her pelvis to her vagina. She states Dr Julien Girt told her she had pelvic girdle pain but "she wanted to check on baby". Pain last for less than 10 minutes then goes away for the day. She rates pain 3/10 and has not taken anything for pain. Decreased fetal movement occurred yesterday when she at home, she did not count the movements but states "it was less than normal". She reports movement today.She denies LOF, vaginal bleeding, vaginal itching/burning, urinary symptoms, h/a, dizziness, n/v, or fever/chills.     Past Medical History: Past Medical History:  Diagnosis Date  . Allergy    sinus   . Asthma   . Headache    migraines  . Ovarian cyst     Past obstetric history: OB History  Gravida Para Term Preterm AB Living  2       1    SAB TAB Ectopic Multiple Live Births  1            # Outcome Date GA Lbr Len/2nd Weight Sex Delivery Anes PTL Lv  2 Current           1 SAB 2013 [redacted]w[redacted]d            Birth Comments: System Generated. Please review and update pregnancy details.      Past Surgical History: Past Surgical History:  Procedure Laterality Date  . CHOLECYSTECTOMY  06/28/12   lap chole  . CHOLECYSTECTOMY      Family History: Family History  Problem Relation Age of Onset  . Cancer Maternal Grandmother        breast  . Diabetes Maternal Grandmother   . Hypertension Maternal Grandmother   . Cancer Maternal Grandfather        panreatic  . Diabetes Maternal Grandfather   . Migraines Mother   . Hypertension Mother   . Cerebral aneurysm Other     Social History: Social History   Tobacco Use  . Smoking status: Never Smoker  . Smokeless tobacco: Never  Used  Substance Use Topics  . Alcohol use: Yes    Comment: social (a few times/year)none since pregnancy  . Drug use: No    Allergies:  Allergies  Allergen Reactions  . Latex Other (See Comments)    Itches, breaks my hands out  . Citric Acid Rash    Breakouts on chest and back.    Meds:  Medications Prior to Admission  Medication Sig Dispense Refill Last Dose  . acetaminophen (TYLENOL) 500 MG tablet Take 1,000 mg every 6 (six) hours as needed by mouth for mild pain.   Past Week at Unknown time  . albuterol (PROVENTIL HFA;VENTOLIN HFA) 108 (90 BASE) MCG/ACT inhaler Inhale 2 puffs into the lungs every 6 (six) hours as needed for wheezing or shortness of breath. 1 Inhaler 0 Rescue  . BONJESTA 20-20 MG TBCR Take 1 tablet by mouth at bedtime as needed (for nausea).   3 01/27/2017 at Unknown time  . cetirizine (ZYRTEC) 10 MG tablet Take 10 mg at bedtime by mouth.   01/27/2017 at Unknown time  . dicyclomine (BENTYL) 10 MG capsule Take 1 capsule (10 mg total) by mouth 3 (three) times daily.  10 capsule 0 Past Month at Unknown time  . diphenhydrAMINE (BENADRYL) 25 MG tablet Take 25 mg every 6 (six) hours as needed by mouth for itching (For rash.).   01/28/2017 at Unknown time  . fluticasone (FLONASE) 50 MCG/ACT nasal spray PLACE 2 SPRAYS INTO BOTH NOSTRILS DAILY. 12.5 g 2 01/28/2017 at Unknown time  . hydrocortisone cream 1 % Apply 1 application 2 (two) times daily as needed topically (For rash.).   01/28/2017 at Unknown time  . metoCLOPramide (REGLAN) 10 MG tablet Take 1 tablet (10 mg total) by mouth every 8 (eight) hours as needed for nausea. 20 tablet 0 01/27/2017 at Unknown time  . pantoprazole (PROTONIX) 40 MG tablet TAKE 1 TABLET BY MOUTH EVERY DAY 30 tablet 0 Past Month at Unknown time  . Prenatal Vit-Fe Fumarate-FA (PRENATAL MULTIVITAMIN) TABS tablet Take 1 tablet at bedtime by mouth.   01/27/2017 at Unknown time  . progesterone (PROMETRIUM) 200 MG capsule Place 1 capsule (200 mg total)  at bedtime vaginally. 30 capsule 4 01/27/2017 at Unknown time  . promethazine (PHENERGAN) 12.5 MG tablet Take 12.5 mg every 6 (six) hours as needed by mouth for nausea or vomiting.   Past Week at Unknown time  . ranitidine (ZANTAC) 300 MG capsule Take 300 mg 2 (two) times daily as needed by mouth for heartburn.   01/27/2017 at Unknown time    ROS:  Review of Systems  Constitutional: Negative.   Respiratory: Negative.   Cardiovascular: Negative.   Gastrointestinal: Positive for abdominal pain. Negative for constipation, diarrhea, nausea and vomiting.  Genitourinary: Positive for pelvic pain. Negative for difficulty urinating, dysuria, frequency, urgency, vaginal bleeding and vaginal discharge.  Musculoskeletal: Negative.   Neurological: Negative.   Psychiatric/Behavioral: Negative.    I have reviewed patient's Past Medical Hx, Surgical Hx, Family Hx, Social Hx, medications and allergies.   Physical Exam   Patient Vitals for the past 24 hrs:  BP Temp Temp src Pulse Resp SpO2  03/18/17 2004 107/61 (!) 97 F (36.1 C) Oral 93 16 -  03/18/17 1848 (!) 102/58 98.3 F (36.8 C) Oral 88 16 98 %   Constitutional: Well-developed, well-nourished female in no acute distress.  Cardiovascular: normal rate Respiratory: normal effort GI: Abd soft, non-tender, gravid appropriate for gestational age.  MS: Extremities nontender, no edema, normal ROM Neurologic: Alert and oriented x 4.  GU: Neg CVAT.  Cervical Exam: Dilation: Closed Effacement (%): 50 Exam by:: Colman Cater CNM  FHT:  Baseline 140 , moderate variability, accelerations present, no decelerations Contractions: none   Labs: Results for orders placed or performed during the hospital encounter of 03/18/17 (from the past 24 hour(s))  Urinalysis, Routine w reflex microscopic     Status: Abnormal   Collection Time: 03/18/17  6:33 PM  Result Value Ref Range   Color, Urine YELLOW YELLOW   APPearance CLEAR CLEAR   Specific Gravity, Urine  1.008 1.005 - 1.030   pH 7.0 5.0 - 8.0   Glucose, UA NEGATIVE NEGATIVE mg/dL   Hgb urine dipstick NEGATIVE NEGATIVE   Bilirubin Urine NEGATIVE NEGATIVE   Ketones, ur NEGATIVE NEGATIVE mg/dL   Protein, ur NEGATIVE NEGATIVE mg/dL   Nitrite NEGATIVE NEGATIVE   Leukocytes, UA TRACE (A) NEGATIVE   RBC / HPF 0-5 0 - 5 RBC/hpf   WBC, UA 0-5 0 - 5 WBC/hpf   Bacteria, UA RARE (A) NONE SEEN   Squamous Epithelial / LPF 0-5 (A) NONE SEEN   --/--/O POS, O POS (11/05 1429)  MAU Course/MDM: Orders Placed This Encounter  Procedures  . Wet prep, genital  . Urinalysis, Routine w reflex microscopic  . Discharge patient Discharge disposition: 01-Home or Self Care; Discharge patient date: 03/18/2017    Meds ordered this encounter  Medications  . Elastic Bandages & Supports (COMFORT FIT MATERNITY SUPP LG) MISC    Sig: 1 Device by Does not apply route daily.    Dispense:  1 each    Refill:  0    Order Specific Question:   Supervising Provider    Answer:   Lavonia Drafts [phone]    NST reviewed Consult Dr Lynnette Caffey with presentation, exam findings and test results.  Pt discharge with strict pelvic rest precautions for short cervix during pregnancy.  Today's evaluation included a work-up for preterm labor which can be life-threatening for both mom and baby.  Assessment: 1. Pelvic pain in pregnancy, antepartum, third trimester   2. Decreased fetal movements in third trimester, single or unspecified fetus     Plan: Discharge home Labor precautions and fetal kick counts Follow up as scheduled and return to MAU as needed for emergencies  Rx for maternity support belt    Allergies as of 03/18/2017      Reactions   Latex Other (See Comments)   Itches, breaks my hands out   Citric Acid Rash   Breakouts on chest and back.      Medication List    TAKE these medications   acetaminophen 500 MG tablet Commonly known as:  TYLENOL Take 1,000 mg every 6 (six) hours as needed by mouth  for mild pain.   albuterol 108 (90 Base) MCG/ACT inhaler Commonly known as:  PROVENTIL HFA;VENTOLIN HFA Inhale 2 puffs into the lungs every 6 (six) hours as needed for wheezing or shortness of breath.   BONJESTA 20-20 MG Tbcr Generic drug:  Doxylamine-Pyridoxine ER Take 1 tablet by mouth at bedtime as needed (for nausea).   cetirizine 10 MG tablet Commonly known as:  ZYRTEC Take 10 mg at bedtime by mouth.   COMFORT FIT MATERNITY SUPP LG Misc 1 Device by Does not apply route daily.   dicyclomine 10 MG capsule Commonly known as:  BENTYL Take 1 capsule (10 mg total) by mouth 3 (three) times daily.   diphenhydrAMINE 25 MG tablet Commonly known as:  BENADRYL Take 25 mg every 6 (six) hours as needed by mouth for itching (For rash.).   fluticasone 50 MCG/ACT nasal spray Commonly known as:  FLONASE PLACE 2 SPRAYS INTO BOTH NOSTRILS DAILY.   hydrocortisone cream 1 % Apply 1 application 2 (two) times daily as needed topically (For rash.).   metoCLOPramide 10 MG tablet Commonly known as:  REGLAN Take 1 tablet (10 mg total) by mouth every 8 (eight) hours as needed for nausea.   pantoprazole 40 MG tablet Commonly known as:  PROTONIX TAKE 1 TABLET BY MOUTH EVERY DAY   prenatal multivitamin Tabs tablet Take 1 tablet at bedtime by mouth.   progesterone 200 MG capsule Commonly known as:  PROMETRIUM Place 1 capsule (200 mg total) at bedtime vaginally.   promethazine 12.5 MG tablet Commonly known as:  PHENERGAN Take 12.5 mg every 6 (six) hours as needed by mouth for nausea or vomiting.   ranitidine 300 MG capsule Commonly known as:  ZANTAC Take 300 mg 2 (two) times daily as needed by mouth for heartburn.       Darrol Poke Certified Nurse-Midwife 03/18/2017 7:36 PM

## 2017-03-18 NOTE — MAU Note (Signed)
+  lower abdominal pain That is sharp and burning in nature. Started Friday. Stopped yesterday and started back today.  Currently rating pain 4/10  Denies LOF or VB.   Reports less fetal movement than usual.

## 2017-03-19 LAB — GC/CHLAMYDIA PROBE AMP (~~LOC~~) NOT AT ARMC
CHLAMYDIA, DNA PROBE: NEGATIVE
NEISSERIA GONORRHEA: NEGATIVE

## 2017-03-20 NOTE — L&D Delivery Note (Signed)
Delivery Note At 10:07 PM a viable female was delivered via Vaginal, Spontaneous OA presentation APGAR: 9, 9; weight pending  .   Placenta status:spontaneously with 3 vessel cord , .  Cord:  with the following complications: none.  Cord pH: not obtained  Anesthesia:epidrual   Episiotomy: Median Lacerations: 2nd degree Suture Repair: 3.0 chromic Est. Blood Loss (mL):  300  Mom to postpartum.  Baby to skin to skin.  Vibha Ferdig L 05/11/2017, 10:24 PM

## 2017-04-24 ENCOUNTER — Encounter (HOSPITAL_COMMUNITY): Payer: Self-pay | Admitting: *Deleted

## 2017-04-24 ENCOUNTER — Inpatient Hospital Stay (HOSPITAL_COMMUNITY)
Admission: AD | Admit: 2017-04-24 | Discharge: 2017-04-24 | Disposition: A | Discharge status: Home / Self Care | Payer: BC Managed Care – PPO | Source: Ambulatory Visit | Attending: Obstetrics & Gynecology | Admitting: Obstetrics & Gynecology

## 2017-04-24 ENCOUNTER — Other Ambulatory Visit: Payer: Self-pay

## 2017-04-24 DIAGNOSIS — O99613 Diseases of the digestive system complicating pregnancy, third trimester: Secondary | ICD-10-CM | POA: Insufficient documentation

## 2017-04-24 DIAGNOSIS — R1013 Epigastric pain: Secondary | ICD-10-CM

## 2017-04-24 DIAGNOSIS — O9989 Other specified diseases and conditions complicating pregnancy, childbirth and the puerperium: Secondary | ICD-10-CM | POA: Diagnosis not present

## 2017-04-24 DIAGNOSIS — K219 Gastro-esophageal reflux disease without esophagitis: Secondary | ICD-10-CM | POA: Diagnosis not present

## 2017-04-24 DIAGNOSIS — Z3A36 36 weeks gestation of pregnancy: Secondary | ICD-10-CM | POA: Diagnosis not present

## 2017-04-24 DIAGNOSIS — Z91040 Latex allergy status: Secondary | ICD-10-CM | POA: Insufficient documentation

## 2017-04-24 DIAGNOSIS — R109 Unspecified abdominal pain: Secondary | ICD-10-CM | POA: Diagnosis not present

## 2017-04-24 LAB — URINALYSIS, ROUTINE W REFLEX MICROSCOPIC
BILIRUBIN URINE: NEGATIVE
Glucose, UA: 50 mg/dL — AB
HGB URINE DIPSTICK: NEGATIVE
Ketones, ur: NEGATIVE mg/dL
LEUKOCYTES UA: NEGATIVE
NITRITE: NEGATIVE
Protein, ur: 30 mg/dL — AB
SPECIFIC GRAVITY, URINE: 1.021 (ref 1.005–1.030)
pH: 6 (ref 5.0–8.0)

## 2017-04-24 LAB — CBC WITH DIFFERENTIAL/PLATELET
Basophils Absolute: 0 10*3/uL (ref 0.0–0.1)
Basophils Relative: 0 %
EOS ABS: 0.1 10*3/uL (ref 0.0–0.7)
EOS PCT: 2 %
HCT: 37.6 % (ref 36.0–46.0)
Hemoglobin: 12.9 g/dL (ref 12.0–15.0)
LYMPHS ABS: 1.4 10*3/uL (ref 0.7–4.0)
Lymphocytes Relative: 24 %
MCH: 31.4 pg (ref 26.0–34.0)
MCHC: 34.3 g/dL (ref 30.0–36.0)
MCV: 91.5 fL (ref 78.0–100.0)
Monocytes Absolute: 0.2 10*3/uL (ref 0.1–1.0)
Monocytes Relative: 4 %
Neutro Abs: 4.1 10*3/uL (ref 1.7–7.7)
Neutrophils Relative %: 70 %
PLATELETS: 196 10*3/uL (ref 150–400)
RBC: 4.11 MIL/uL (ref 3.87–5.11)
RDW: 13.2 % (ref 11.5–15.5)
WBC: 5.8 10*3/uL (ref 4.0–10.5)

## 2017-04-24 LAB — COMPREHENSIVE METABOLIC PANEL
ALT: 19 U/L (ref 14–54)
ANION GAP: 9 (ref 5–15)
AST: 23 U/L (ref 15–41)
Albumin: 2.9 g/dL — ABNORMAL LOW (ref 3.5–5.0)
Alkaline Phosphatase: 156 U/L — ABNORMAL HIGH (ref 38–126)
BUN: 6 mg/dL (ref 6–20)
CHLORIDE: 106 mmol/L (ref 101–111)
CO2: 17 mmol/L — AB (ref 22–32)
Calcium: 8.2 mg/dL — ABNORMAL LOW (ref 8.9–10.3)
Creatinine, Ser: 0.65 mg/dL (ref 0.44–1.00)
GFR calc non Af Amer: >60 mL/min (ref >60)
Glucose, Bld: 99 mg/dL (ref 65–99)
POTASSIUM: 3.3 mmol/L — AB (ref 3.5–5.1)
SODIUM: 132 mmol/L — AB (ref 135–145)
Total Bilirubin: 0.5 mg/dL (ref 0.3–1.2)
Total Protein: 6.6 g/dL (ref 6.5–8.1)

## 2017-04-24 LAB — PROTEIN / CREATININE RATIO, URINE
Creatinine, Urine: 258 mg/dL
Protein Creatinine Ratio: 0.09 mg/mg{Cre} (ref 0.00–0.15)
TOTAL PROTEIN, URINE: 22 mg/dL

## 2017-04-24 MED ORDER — GI COCKTAIL ~~LOC~~
30.0000 mL | Freq: Once | ORAL | Status: AC
  Administered 2017-04-24: 30 mL via ORAL
  Filled 2017-04-24: qty 30

## 2017-04-24 NOTE — MAU Note (Signed)
Having real bad stomach pains, upper abd.  Nauseated and been having headaches (none currently) Dr sent her in for further eval.

## 2017-04-24 NOTE — MAU Provider Note (Signed)
History     CSN: [id number]  Arrival date and time: 04/24/17 1231   First Provider Initiated Contact with Patient 04/24/17 1312      Chief Complaint  Patient presents with  . Abdominal Pain  . Headache  . Nausea   HPI  Ms. Lauren Leonard is a 27 y.o. G2P0010 at [redacted]w[redacted]d who presents to MAU today from the office with complaint of epigastric pain. Dr. Gaetano Net wanted her to be evaluated for pre-eclampsia even with normal blood pressures in the office. The patient states that she had a headache yesterday, but none today. She denies vaginal bleeding or LOF. She has had few irregular contractions. She was 3 cm in the office per patient. She had preterm cervical shortening with funneling at "5 months" and has been on progterone until last night. She has been diagnosed with reflux and is taking Prevacid daily since last week. She reports normal fetal movement.   OB History    Gravida Para Term Preterm AB Living   2       1     SAB TAB Ectopic Multiple Live Births   1              Past Medical History:  Diagnosis Date  . Allergy    sinus   . Asthma   . Headache    migraines  . Ovarian cyst     Past Surgical History:  Procedure Laterality Date  . CHOLECYSTECTOMY  06/28/12   lap chole  . CHOLECYSTECTOMY      Family History  Problem Relation Age of Onset  . Cancer Maternal Grandmother        breast  . Diabetes Maternal Grandmother   . Hypertension Maternal Grandmother   . Cancer Maternal Grandfather        panreatic  . Diabetes Maternal Grandfather   . Migraines Mother   . Hypertension Mother   . Cerebral aneurysm Other     Social History   Tobacco Use  . Smoking status: Never Smoker  . Smokeless tobacco: Never Used  Substance Use Topics  . Alcohol use: Yes    Comment: social (a few times/year)none since pregnancy  . Drug use: No    Allergies:  Allergies  Allergen Reactions  . Latex Other (See Comments)    Itches, breaks my hands out  . Citric Acid Rash     Breakouts on chest and back.    Medications Prior to Admission  Medication Sig Dispense Refill Last Dose  . acetaminophen (TYLENOL) 500 MG tablet Take 1,000 mg every 6 (six) hours as needed by mouth for mild pain.   04/23/2017 at Unknown time  . albuterol (PROVENTIL HFA;VENTOLIN HFA) 108 (90 BASE) MCG/ACT inhaler Inhale 2 puffs into the lungs every 6 (six) hours as needed for wheezing or shortness of breath. 1 Inhaler 0 PRN  . dicyclomine (BENTYL) 10 MG capsule Take 1 capsule (10 mg total) by mouth 3 (three) times daily. (Patient taking differently: Take 10 mg by mouth 3 (three) times daily as needed for spasms. ) 10 capsule 0 Past Month at Unknown time  . diphenhydrAMINE (BENADRYL) 25 MG tablet Take 25 mg every 6 (six) hours as needed by mouth for itching (For rash.).   Past Week at Unknown time  . fluticasone (FLONASE) 50 MCG/ACT nasal spray PLACE 2 SPRAYS INTO BOTH NOSTRILS DAILY. 12.5 g 2 PRN  . lansoprazole (PREVACID) 30 MG capsule Take 30 mg by mouth daily at 12 noon.   04/23/2017  at Unknown time  . metoCLOPramide (REGLAN) 10 MG tablet Take 1 tablet (10 mg total) by mouth every 8 (eight) hours as needed for nausea. 20 tablet 0 Past Week at Unknown time  . Prenatal Vit-Fe Fumarate-FA (PRENATAL MULTIVITAMIN) TABS tablet Take 1 tablet at bedtime by mouth.   Past Week at Unknown time  . progesterone (PROMETRIUM) 200 MG capsule Place 1 capsule (200 mg total) at bedtime vaginally. 30 capsule 4 04/23/2017 at Unknown time  . ranitidine (ZANTAC) 300 MG tablet Take 300 mg by mouth at bedtime.   Past Week at Unknown time  . Elastic Bandages & Supports (COMFORT FIT MATERNITY SUPP LG) MISC 1 Device by Does not apply route daily. 1 each 0   . pantoprazole (PROTONIX) 40 MG tablet TAKE 1 TABLET BY MOUTH EVERY DAY 30 tablet 0 Past Month at Unknown time    Review of Systems  Constitutional: Negative for fever.  Eyes: Negative for visual disturbance.  Cardiovascular: Positive for leg swelling.  Gastrointestinal:  Positive for abdominal pain. Negative for constipation, diarrhea, nausea and vomiting.  Genitourinary: Negative for vaginal bleeding and vaginal discharge.  Neurological: Negative for headaches.   Physical Exam   Blood pressure 98/66, pulse 83, temperature 98.1 F (36.7 C), temperature source Oral, resp. rate 16, weight 216 lb (98 kg), last menstrual period 08/15/2016, SpO2 96 %.  Physical Exam  Nursing note and vitals reviewed. Constitutional: She is oriented to person, place, and time. She appears well-developed and well-nourished. No distress.  HENT:  Head: Normocephalic and atraumatic.  Cardiovascular: Normal rate.  Respiratory: Effort normal.  GI: Soft. She exhibits no distension and no mass. There is tenderness (mild to moderate tenderness to palpation midline epigastric). There is no rebound and no guarding.  Neurological: She is alert and oriented to person, place, and time.  Skin: Skin is warm and dry. No erythema.  Psychiatric: She has a normal mood and affect.     Results for orders placed or performed during the hospital encounter of 04/24/17 (from the past 24 hour(s))  Protein / creatinine ratio, urine     Status: None   Collection Time: 04/24/17 12:39 PM  Result Value Ref Range   Creatinine, Urine 258.00 mg/dL   Total Protein, Urine 22 mg/dL   Protein Creatinine Ratio 0.09 0.00 - 0.15 mg/mg[Cre]  Urinalysis, Routine w reflex microscopic     Status: Abnormal   Collection Time: 04/24/17 12:53 PM  Result Value Ref Range   Color, Urine AMBER (A) YELLOW   APPearance CLOUDY (A) CLEAR   Specific Gravity, Urine 1.021 1.005 - 1.030   pH 6.0 5.0 - 8.0   Glucose, UA 50 (A) NEGATIVE mg/dL   Hgb urine dipstick NEGATIVE NEGATIVE   Bilirubin Urine NEGATIVE NEGATIVE   Ketones, ur NEGATIVE NEGATIVE mg/dL   Protein, ur 30 (A) NEGATIVE mg/dL   Nitrite NEGATIVE NEGATIVE   Leukocytes, UA NEGATIVE NEGATIVE   RBC / HPF 0-5 0 - 5 RBC/hpf   WBC, UA 6-30 0 - 5 WBC/hpf   Bacteria, UA  MANY (A) NONE SEEN   Squamous Epithelial / LPF 6-30 (A) NONE SEEN   Mucus PRESENT   CBC with Differential/Platelet     Status: None   Collection Time: 04/24/17  1:23 PM  Result Value Ref Range   WBC 5.8 4.0 - 10.5 K/uL   RBC 4.11 3.87 - 5.11 MIL/uL   Hemoglobin 12.9 12.0 - 15.0 g/dL   HCT 37.6 36.0 - 46.0 %   MCV 91.5  78.0 - 100.0 fL   MCH 31.4 26.0 - 34.0 pg   MCHC 34.3 30.0 - 36.0 g/dL   RDW 13.2 11.5 - 15.5 %   Platelets 196 150 - 400 K/uL   Neutrophils Relative % 70 %   Neutro Abs 4.1 1.7 - 7.7 K/uL   Lymphocytes Relative 24 %   Lymphs Abs 1.4 0.7 - 4.0 K/uL   Monocytes Relative 4 %   Monocytes Absolute 0.2 0.1 - 1.0 K/uL   Eosinophils Relative 2 %   Eosinophils Absolute 0.1 0.0 - 0.7 K/uL   Basophils Relative 0 %   Basophils Absolute 0.0 0.0 - 0.1 K/uL  Comprehensive metabolic panel     Status: Abnormal   Collection Time: 04/24/17  1:23 PM  Result Value Ref Range   Sodium 132 (L) 135 - 145 mmol/L   Potassium 3.3 (L) 3.5 - 5.1 mmol/L   Chloride 106 101 - 111 mmol/L   CO2 17 (L) 22 - 32 mmol/L   Glucose, Bld 99 65 - 99 mg/dL   BUN 6 6 - 20 mg/dL   Creatinine, Ser 0.65 0.44 - 1.00 mg/dL   Calcium 8.2 (L) 8.9 - 10.3 mg/dL   Total Protein 6.6 6.5 - 8.1 g/dL   Albumin 2.9 (L) 3.5 - 5.0 g/dL   AST 23 15 - 41 U/L   ALT 19 14 - 54 U/L   Alkaline Phosphatase 156 (H) 38 - 126 U/L   Total Bilirubin 0.5 0.3 - 1.2 mg/dL   GFR calc non Af Amer >60 >60 mL/min   GFR calc Af Amer >60 >60 mL/min   Anion gap 9 5 - 15    Patient Vitals for the past 24 hrs:  BP Temp Temp src Pulse Resp SpO2 Weight  04/24/17 1428 98/66 - - 83 - - -  04/24/17 1400 101/66 - - 87 - - -  04/24/17 1345 98/63 - - 95 - - -  04/24/17 1330 101/71 - - 92 - - -  04/24/17 1315 99/66 - - 93 - - -  04/24/17 1302 100/63 - - 90 - - -  04/24/17 1250 112/64 98.1 F (36.7 C) Oral 94 16 96 % 216 lb (98 kg)   Fetal Monitoring: Baseline: 140 bpm Variability: moderate Accelerations: 15 x 15 Decelerations: none   Contractions: none   MAU Course  Procedures None  MDM CBC, CMP, UA and Urine protein/creatinine ratio ordered Serial BPs Discussed with Dr. Lynnette Caffey. [place] with plan to trial GI cocktail in MAU for pain. Patient may be discharged since labs do not indicate pre-eclampsia and she has not had any elevated blood pressures today. Patient reports some improvement in pain.  Assessment and Plan  A: SIUP at [redacted]w[redacted]d GERD  Epigastric pain   P:  Discharge home Continue Prevacid as previously prescribed GERD diet discussed and included on AVS Preterm labor precautions discussed Patient advised to follow-up with [facility] as scheduled for routine prenatal care Patient may return to MAU as needed or if her condition were to change or worsen  Kerry Hough, PA-C 04/24/2017, 2:33 PM

## 2017-04-24 NOTE — Discharge Instructions (Signed)
Food Choices for Gastroesophageal Reflux Disease, Adult When you have gastroesophageal reflux disease (GERD), the foods you eat and your eating habits are very important. Choosing the right foods can help ease your discomfort. What guidelines do I need to follow?  Choose fruits, vegetables, whole grains, and low-fat dairy products.  Choose low-fat meat, fish, and poultry.  Limit fats such as oils, salad dressings, butter, nuts, and avocado.  Keep a food diary. This helps you identify foods that cause symptoms.  Avoid foods that cause symptoms. These may be different for everyone.  Eat small meals often instead of 3 large meals a day.  Eat your meals slowly, in a place where you are relaxed.  Limit fried foods.  Cook foods using methods other than frying.  Avoid drinking alcohol.  Avoid drinking large amounts of liquids with your meals.  Avoid bending over or lying down until 2-3 hours after eating. What foods are not recommended? These are some foods and drinks that may make your symptoms worse: Vegetables  Tomatoes. Tomato juice. Tomato and spaghetti sauce. Chili peppers. Onion and garlic. Horseradish. Fruits  Oranges, grapefruit, and lemon (fruit and juice). Meats  High-fat meats, fish, and poultry. This includes hot dogs, ribs, ham, sausage, salami, and bacon. Dairy  Whole milk and chocolate milk. Sour cream. Cream. Butter. Ice cream. Cream cheese. Drinks  Coffee and tea. Bubbly (carbonated) drinks or energy drinks. Condiments  Hot sauce. Barbecue sauce. Sweets/Desserts  Chocolate and cocoa. Donuts. Peppermint and spearmint. Fats and Oils  High-fat foods. This includes French fries and potato chips. Other  Vinegar. Strong spices. This includes black pepper, white pepper, red pepper, cayenne, curry powder, cloves, ginger, and chili powder. The items listed above may not be a complete list of foods and drinks to avoid. Contact your dietitian for more information.    This information is not intended to replace advice given to you by your health care provider. Make sure you discuss any questions you have with your health care provider. Document Released: 09/05/2011 Document Revised: 08/12/2015 Document Reviewed: 01/08/2013 [employer] Interactive Patient Education  2017 [employer]  

## 2017-05-06 ENCOUNTER — Inpatient Hospital Stay (HOSPITAL_COMMUNITY)
Admission: AD | Admit: 2017-05-06 | Discharge: 2017-05-06 | Disposition: A | Discharge status: Home / Self Care | Payer: BC Managed Care – PPO | Source: Ambulatory Visit | Attending: Obstetrics and Gynecology | Admitting: Obstetrics and Gynecology

## 2017-05-06 ENCOUNTER — Encounter (HOSPITAL_COMMUNITY): Payer: Self-pay | Admitting: *Deleted

## 2017-05-06 DIAGNOSIS — O471 False labor at or after 37 completed weeks of gestation: Secondary | ICD-10-CM | POA: Diagnosis present

## 2017-05-06 DIAGNOSIS — Z3A38 38 weeks gestation of pregnancy: Secondary | ICD-10-CM | POA: Insufficient documentation

## 2017-05-06 DIAGNOSIS — O479 False labor, unspecified: Secondary | ICD-10-CM

## 2017-05-06 HISTORY — DX: Irritable bowel syndrome without diarrhea: K58.9

## 2017-05-06 NOTE — MAU Note (Signed)
Pt C/O uc's since around 0930 this morning, have been sporadic @ times, denies bleeding or LOF.

## 2017-05-06 NOTE — Discharge Instructions (Signed)
Vaginal Delivery  Vaginal delivery means that you will give birth by pushing your baby out of your birth canal (vagina). A team of health care providers will help you before, during, and after vaginal delivery. Birth experiences are unique for every woman and every pregnancy, and birth experiences vary depending on where you choose to give birth.  What should I do to prepare for my baby's birth?  Before your baby is born, it is important to talk with your health care provider about:   Your labor and delivery preferences. These may include:  ? Medicines that you may be given.  ? How you will manage your pain. This might include non-medical pain relief techniques or injectable pain relief such as epidural analgesia.  ? How you and your baby will be monitored during labor and delivery.  ? Who may be in the labor and delivery room with you.  ? Your feelings about surgical delivery of your baby (cesarean delivery, or C-section) if this becomes necessary.  ? Your feelings about receiving donated blood through an IV tube (blood transfusion) if this becomes necessary.   Whether you are able:  ? To take pictures or videos of the birth.  ? To eat during labor and delivery.  ? To move around, walk, or change positions during labor and delivery.   What to expect after your baby is born, such as:  ? Whether delayed umbilical cord clamping and cutting is offered.  ? Who will care for your baby right after birth.  ? Medicines or tests that may be recommended for your baby.  ? Whether breastfeeding is supported in your hospital or birth center.  ? How long you will be in the hospital or birth center.   How any medical conditions you have may affect your baby or your labor and delivery experience.    To prepare for your baby's birth, you should also:   Attend all of your health care visits before delivery (prenatal visits) as recommended by your health care provider. This is important.   Prepare your home for your baby's  arrival. Make sure that you have:  ? Diapers.  ? Baby clothing.  ? Feeding equipment.  ? Safe sleeping arrangements for you and your baby.   Install a car seat in your vehicle. Have your car seat checked by a certified car seat installer to make sure that it is installed safely.   Think about who will help you with your new baby at home for at least the first several weeks after delivery.    What can I expect when I arrive at the birth center or hospital?  Once you are in labor and have been admitted into the hospital or birth center, your health care provider may:   Review your pregnancy history and any concerns you have.   Insert an IV tube into one of your veins. This is used to give you fluids and medicines.   Check your blood pressure, pulse, temperature, and heart rate (vital signs).   Check whether your bag of water (amniotic sac) has broken (ruptured).   Talk with you about your birth plan and discuss pain control options.    Monitoring  Your health care provider may monitor your contractions (uterine monitoring) and your baby's heart rate (fetal monitoring). You may need to be monitored:   Often, but not continuously (intermittently).   All the time or for long periods at a time (continuously). Continuous monitoring may be needed if:  ?   You are taking certain medicines, such as medicine to relieve pain or make your contractions stronger.  ? You have pregnancy or labor complications.    Monitoring may be done by:   Placing a special stethoscope or a handheld monitoring device on your abdomen to check your baby's heartbeat, and feeling your abdomen for contractions. This method of monitoring does not continuously record your baby's heartbeat or your contractions.   Placing monitors on your abdomen (external monitors) to record your baby's heartbeat and the frequency and length of contractions. You may not have to wear external monitors all the time.   Placing monitors inside of your uterus  (internal monitors) to record your baby's heartbeat and the frequency, length, and strength of your contractions.  ? Your health care provider may use internal monitors if he or she needs more information about the strength of your contractions or your baby's heart rate.  ? Internal monitors are put in place by passing a thin, flexible wire through your vagina and into your uterus. Depending on the type of monitor, it may remain in your uterus or on your baby's head until birth.  ? Your health care provider will discuss the benefits and risks of internal monitoring with you and will ask for your permission before inserting the monitors.   Telemetry. This is a type of continuous monitoring that can be done with external or internal monitors. Instead of having to stay in bed, you are able to move around during telemetry. Ask your health care provider if telemetry is an option for you.    Physical exam  Your health care provider may perform a physical exam. This may include:   Checking whether your baby is positioned:  ? With the head toward your vagina (head-down). This is most common.  ? With the head toward the top of your uterus (head-up or breech). If your baby is in a breech position, your health care provider may try to turn your baby to a head-down position so you can deliver vaginally. If it does not seem that your baby can be born vaginally, your provider may recommend surgery to deliver your baby. In rare cases, you may be able to deliver vaginally if your baby is head-up (breech delivery).  ? Lying sideways (transverse). Babies that are lying sideways cannot be delivered vaginally.   Checking your cervix to determine:  ? Whether it is thinning out (effacing).  ? Whether it is opening up (dilating).  ? How low your baby has moved into your birth canal.    What are the three stages of labor and delivery?    Normal labor and delivery is divided into the following three stages:  Stage 1   Stage 1 is the  longest stage of labor, and it can last for hours or days. Stage 1 includes:  ? Early labor. This is when contractions may be irregular, or regular and mild. Generally, early labor contractions are more than 10 minutes apart.  ? Active labor. This is when contractions get longer, more regular, more frequent, and more intense.  ? The transition phase. This is when contractions happen very close together, are very intense, and may last longer than during any other part of labor.   Contractions generally feel mild, infrequent, and irregular at first. They get stronger, more frequent (about every 2-3 minutes), and more regular as you progress from early labor through active labor and transition.   Many women progress through stage 1 naturally, but   you may need help to continue making progress. If this happens, your health care provider may talk with you about:  ? Rupturing your amniotic sac if it has not ruptured yet.  ? Giving you medicine to help make your contractions stronger and more frequent.   Stage 1 ends when your cervix is completely dilated to 4 inches (10 cm) and completely effaced. This happens at the end of the transition phase.  Stage 2   Once your cervix is completely effaced and dilated to 4 inches (10 cm), you may start to feel an urge to push. It is common for the body to naturally take a rest before feeling the urge to push, especially if you received an epidural or certain other pain medicines. This rest period may last for up to 1-2 hours, depending on your unique labor experience.   During stage 2, contractions are generally less painful, because pushing helps relieve contraction pain. Instead of contraction pain, you may feel stretching and burning pain, especially when the widest part of your baby's head passes through the vaginal opening (crowning).   Your health care provider will closely monitor your pushing progress and your baby's progress through the vagina during stage 2.   Your  health care provider may massage the area of skin between your vaginal opening and anus (perineum) or apply warm compresses to your perineum. This helps it stretch as the baby's head starts to crown, which can help prevent perineal tearing.  ? In some cases, an incision may be made in your perineum (episiotomy) to allow the baby to pass through the vaginal opening. An episiotomy helps to make the opening of the vagina larger to allow more room for the baby to fit through.   It is very important to breathe and focus so your health care provider can control the delivery of your baby's head. Your health care provider may have you decrease the intensity of your pushing, to help prevent perineal tearing.   After delivery of your baby's head, the shoulders and the rest of the body generally deliver very quickly and without difficulty.   Once your baby is delivered, the umbilical cord may be cut right away, or this may be delayed for 1-2 minutes, depending on your baby's health. This may vary among health care providers, hospitals, and birth centers.   If you and your baby are healthy enough, your baby may be placed on your chest or abdomen to help maintain the baby's temperature and to help you bond with each other. Some mothers and babies start breastfeeding at this time. Your health care team will dry your baby and help keep your baby warm during this time.   Your baby may need immediate care if he or she:  ? Showed signs of distress during labor.  ? Has a medical condition.  ? Was born too early (prematurely).  ? Had a bowel movement before birth (meconium).  ? Shows signs of difficulty transitioning from being inside the uterus to being outside of the uterus.  If you are planning to breastfeed, your health care team will help you begin a feeding.  Stage 3   The third stage of labor starts immediately after the birth of your baby and ends after you deliver the placenta. The placenta is an organ that develops  during pregnancy to provide oxygen and nutrients to your baby in the womb.   Delivering the placenta may require some pushing, and you may have mild contractions. Breastfeeding   can stimulate contractions to help you deliver the placenta.   After the placenta is delivered, your uterus should tighten (contract) and become firm. This helps to stop bleeding in your uterus. To help your uterus contract and to control bleeding, your health care provider may:  ? Give you medicine by injection, through an IV tube, by mouth, or through your rectum (rectally).  ? Massage your abdomen or perform a vaginal exam to remove any blood clots that are left in your uterus.  ? Empty your bladder by placing a thin, flexible tube (catheter) into your bladder.  ? Encourage you to breastfeed your baby.  After labor is over, you and your baby will be monitored closely to ensure that you are both healthy until you are ready to go home. Your health care team will teach you how to care for yourself and your baby.  This information is not intended to replace advice given to you by your health care provider. Make sure you discuss any questions you have with your health care provider.  Document Released: 12/14/2007 Document Revised: 09/24/2015 Document Reviewed: 03/21/2015  [employer]vier Interactive Patient Education  2018 [employer]  Fetal Movement Counts  Patient Name: ________________________________________________ Patient Due Date: ____________________  What is a fetal movement count?  A fetal movement count is the number of times that you feel your baby move during a certain amount of time. This may also be called a fetal kick count. A fetal movement count is recommended for every pregnant woman. You may be asked to start counting fetal movements as early as week 28 of your pregnancy.  Pay attention to when your baby is most active. You may notice your baby's sleep and wake cycles. You may also notice things that make your baby move more.  You should do a fetal movement count:   When your baby is normally most active.   At the same time each day.    A good time to count movements is while you are resting, after having something to eat and drink.  How do I count fetal movements?  1. Find a quiet, comfortable area. Sit, or lie down on your side.  2. Write down the date, the start time and stop time, and the number of movements that you felt between those two times. Take this information with you to your health care visits.  3. For 2 hours, count kicks, flutters, swishes, rolls, and jabs. You should feel at least 10 movements during 2 hours.  4. You may stop counting after you have felt 10 movements.  5. If you do not feel 10 movements in 2 hours, have something to eat and drink. Then, keep resting and counting for 1 hour. If you feel at least 4 movements during that hour, you may stop counting.  Contact a health care provider if:   You feel fewer than 4 movements in 2 hours.   Your baby is not moving like he or she usually does.  Date: ____________ Start time: ____________ Stop time: ____________ Movements: ____________  Date: ____________ Start time: ____________ Stop time: ____________ Movements: ____________  Date: ____________ Start time: ____________ Stop time: ____________ Movements: ____________  Date: ____________ Start time: ____________ Stop time: ____________ Movements: ____________  Date: ____________ Start time: ____________ Stop time: ____________ Movements: ____________  Date: ____________ Start time: ____________ Stop time: ____________ Movements: ____________  Date: ____________ Start time: ____________ Stop time: ____________ Movements: ____________  Date: ____________ Start time: ____________ Stop time: ____________ Movements: ____________

## 2017-05-06 NOTE — MAU Note (Signed)
I have communicated with Dr. Royston Sinner and reviewed vital signs:  Vitals:   05/06/17 1641 05/06/17 1757  BP: (!) 106/56 (!) 106/59  Pulse: 69 84  Resp: 18   Temp: 97.6 F (36.4 C)     Vaginal exam:  Dilation: 2.5 Effacement (%): 70 Cervical Position: Posterior Station: -3 Presentation: Vertex Exam by:: Velna Ochs RN,   Also reviewed contraction pattern and that non-stress test is reactive.  It has been documented that patient is contracting every 1-7 minutes with SVE 2-3/70/-3, posterior, not indicating active labor.  Patient denies any other complaints.  Based on this report provider has given order for discharge.  A discharge order and diagnosis entered by a provider.   Labor discharge instructions reviewed with patient.

## 2017-05-11 ENCOUNTER — Inpatient Hospital Stay (HOSPITAL_COMMUNITY): Payer: BC Managed Care – PPO | Admitting: Anesthesiology

## 2017-05-11 ENCOUNTER — Inpatient Hospital Stay (HOSPITAL_COMMUNITY)
Admission: AD | Admit: 2017-05-11 | Discharge: 2017-05-13 | DRG: 807 | Disposition: A | Discharge status: Home / Self Care | Payer: BC Managed Care – PPO | Source: Ambulatory Visit | Attending: Obstetrics and Gynecology | Admitting: Obstetrics and Gynecology

## 2017-05-11 ENCOUNTER — Encounter (HOSPITAL_COMMUNITY): Payer: Self-pay | Admitting: *Deleted

## 2017-05-11 DIAGNOSIS — Z3483 Encounter for supervision of other normal pregnancy, third trimester: Secondary | ICD-10-CM | POA: Diagnosis present

## 2017-05-11 DIAGNOSIS — O26872 Cervical shortening, second trimester: Secondary | ICD-10-CM

## 2017-05-11 DIAGNOSIS — Z3A38 38 weeks gestation of pregnancy: Secondary | ICD-10-CM

## 2017-05-11 LAB — CBC
HCT: 39.7 % (ref 36.0–46.0)
HEMOGLOBIN: 13.9 g/dL (ref 12.0–15.0)
MCH: 31.5 pg (ref 26.0–34.0)
MCHC: 35 g/dL (ref 30.0–36.0)
MCV: 90 fL (ref 78.0–100.0)
Platelets: 241 10*3/uL (ref 150–400)
RBC: 4.41 MIL/uL (ref 3.87–5.11)
RDW: 12.9 % (ref 11.5–15.5)
WBC: 7.5 10*3/uL (ref 4.0–10.5)

## 2017-05-11 LAB — TYPE AND SCREEN
ABO/RH(D): O POS
ANTIBODY SCREEN: NEGATIVE

## 2017-05-11 MED ORDER — EPHEDRINE 5 MG/ML INJ
10.0000 mg | INTRAVENOUS | Status: DC | PRN
  Filled 2017-05-11: qty 2

## 2017-05-11 MED ORDER — ONDANSETRON HCL 4 MG/2ML IJ SOLN: 4.0000 mg | Freq: Four times a day (QID) | INTRAMUSCULAR | Status: DC | PRN

## 2017-05-11 MED ORDER — LACTATED RINGERS IV SOLN
INTRAVENOUS | Status: DC
  Administered 2017-05-11: 20:00:00 via INTRAVENOUS

## 2017-05-11 MED ORDER — PHENYLEPHRINE 40 MCG/ML (10ML) SYRINGE FOR IV PUSH (FOR BLOOD PRESSURE SUPPORT)
80.0000 ug | PREFILLED_SYRINGE | INTRAVENOUS | Status: DC | PRN
  Filled 2017-05-11: qty 5

## 2017-05-11 MED ORDER — OXYCODONE-ACETAMINOPHEN 5-325 MG PO TABS: 2.0000 | ORAL_TABLET | ORAL | Status: DC | PRN

## 2017-05-11 MED ORDER — BUTORPHANOL TARTRATE 1 MG/ML IJ SOLN
2.0000 mg | Freq: Once | INTRAMUSCULAR | Status: AC
  Administered 2017-05-11: 2 mg via INTRAVENOUS

## 2017-05-11 MED ORDER — FLEET ENEMA 7-19 GM/118ML RE ENEM: 1.0000 | ENEMA | RECTAL | Status: DC | PRN

## 2017-05-11 MED ORDER — LIDOCAINE HCL (PF) 1 % IJ SOLN
30.0000 mL | INTRAMUSCULAR | Status: DC | PRN
  Administered 2017-05-11: 30 mL via SUBCUTANEOUS
  Filled 2017-05-11: qty 30

## 2017-05-11 MED ORDER — PHENYLEPHRINE 40 MCG/ML (10ML) SYRINGE FOR IV PUSH (FOR BLOOD PRESSURE SUPPORT): 80.0000 ug | PREFILLED_SYRINGE | INTRAVENOUS | Status: DC | PRN

## 2017-05-11 MED ORDER — OXYTOCIN BOLUS FROM INFUSION
500.0000 mL | Freq: Once | INTRAVENOUS | Status: AC
  Administered 2017-05-11: 500 mL via INTRAVENOUS

## 2017-05-11 MED ORDER — EPHEDRINE 5 MG/ML INJ: 10.0000 mg | INTRAVENOUS | Status: DC | PRN

## 2017-05-11 MED ORDER — LACTATED RINGERS IV SOLN: 500.0000 mL | Freq: Once | INTRAVENOUS | Status: DC

## 2017-05-11 MED ORDER — OXYCODONE-ACETAMINOPHEN 5-325 MG PO TABS: 1.0000 | ORAL_TABLET | ORAL | Status: DC | PRN

## 2017-05-11 MED ORDER — FENTANYL 2.5 MCG/ML BUPIVACAINE 1/10 % EPIDURAL INFUSION (WH - ANES)
14.0000 mL/h | INTRAMUSCULAR | Status: DC | PRN
  Administered 2017-05-11: 14 mL/h via EPIDURAL

## 2017-05-11 MED ORDER — ACETAMINOPHEN 325 MG PO TABS: 650.0000 mg | ORAL_TABLET | ORAL | Status: DC | PRN

## 2017-05-11 MED ORDER — PHENYLEPHRINE 40 MCG/ML (10ML) SYRINGE FOR IV PUSH (FOR BLOOD PRESSURE SUPPORT)
PREFILLED_SYRINGE | INTRAVENOUS | Status: AC
  Filled 2017-05-11: qty 10

## 2017-05-11 MED ORDER — LIDOCAINE HCL (PF) 1 % IJ SOLN
INTRAMUSCULAR | Status: DC | PRN
  Administered 2017-05-11: 6 mL via EPIDURAL

## 2017-05-11 MED ORDER — OXYTOCIN 40 UNITS IN LACTATED RINGERS INFUSION - SIMPLE MED
2.5000 [IU]/h | INTRAVENOUS | Status: DC
  Administered 2017-05-11: 2.5 [IU]/h via INTRAVENOUS
  Filled 2017-05-11: qty 1000

## 2017-05-11 MED ORDER — FENTANYL 2.5 MCG/ML BUPIVACAINE 1/10 % EPIDURAL INFUSION (WH - ANES)
INTRAMUSCULAR | Status: AC
  Filled 2017-05-11: qty 100

## 2017-05-11 MED ORDER — LACTATED RINGERS IV SOLN: 500.0000 mL | INTRAVENOUS | Status: DC | PRN

## 2017-05-11 MED ORDER — DIPHENHYDRAMINE HCL 50 MG/ML IJ SOLN: 12.5000 mg | INTRAMUSCULAR | Status: DC | PRN

## 2017-05-11 MED ORDER — BUTORPHANOL TARTRATE 1 MG/ML IJ SOLN
INTRAMUSCULAR | Status: AC
  Filled 2017-05-11: qty 2

## 2017-05-11 NOTE — Anesthesia Procedure Notes (Signed)
Epidural Patient location during procedure: OB Start time: 05/11/2017 8:16 PM End time: 05/11/2017 8:28 PM  Staffing Anesthesiologist: Barnet Glasgow, MD Performed: anesthesiologist   Preanesthetic Checklist Completed: patient identified, site marked, surgical consent, pre-op evaluation, timeout performed, IV checked, risks and benefits discussed and monitors and equipment checked  Epidural Patient position: sitting Prep: site prepped and draped and DuraPrep Patient monitoring: continuous pulse ox and blood pressure Approach: midline Location: L3-L4 Injection technique: LOR air  Needle:  Needle type: Tuohy  Needle gauge: 17 G Needle length: 9 cm and 9 Needle insertion depth: 5 cm cm Catheter type: closed end flexible Catheter size: 19 Gauge Catheter at skin depth: 10 cm Test dose: negative  Assessment Events: blood not aspirated, injection not painful, no injection resistance, negative IV test and no paresthesia

## 2017-05-11 NOTE — H&P (Signed)
Lauren Leonard is a 27 y.o. G 2 P 0 at 72 w 3 days presents with SROM and Active labor. OB History    Gravida Para Term Preterm AB Living   2       1     SAB TAB Ectopic Multiple Live Births   1             Past Medical History:  Diagnosis Date  . Allergy    sinus   . Asthma   . Headache    migraines  . IBS (irritable bowel syndrome)   . Ovarian cyst    Past Surgical History:  Procedure Laterality Date  . CHOLECYSTECTOMY  06/28/12   lap chole  . CHOLECYSTECTOMY     Family History: family history includes Cancer in her maternal grandfather and maternal grandmother; Cerebral aneurysm in her other; Diabetes in her maternal grandfather and maternal grandmother; Hypertension in her maternal grandmother and mother; Migraines in her mother. Social History:  reports that  has never smoked. she has never used smokeless tobacco. She reports that she drinks alcohol. She reports that she does not use drugs.     Maternal Diabetes: No Genetic Screening: Normal Maternal Ultrasounds/Referrals: Normal Fetal Ultrasounds or other Referrals:  None Maternal Substance Abuse:  No Significant Maternal Medications:  None Significant Maternal Lab Results:  None Other Comments:  None  Review of Systems  All other systems reviewed and are negative.  Maternal Medical History:  Reason for admission: Rupture of membranes and contractions.     Dilation: 10 Effacement (%): 100 Station: +1 Exam by:: Katha Hamming RN Blood pressure (!) 115/56, pulse 74, temperature (!) 97.5 F (36.4 C), temperature source Oral, resp. rate 18, last menstrual period 08/15/2016, SpO2 100 %.   Fetal Exam Fetal State Assessment: Category I - tracings are normal.     Physical Exam  Nursing note and vitals reviewed. Constitutional: She appears well-developed and well-nourished.  HENT:  Head: Normocephalic and atraumatic.  Eyes: Pupils are equal, round, and reactive to light.  Neck: Normal range of motion.   Cardiovascular: Normal rate and regular rhythm.  Respiratory: Effort normal.    Prenatal labs: ABO, Rh: --/--/O POS (02/22 1940) Antibody: NEG (02/22 1939) Rubella:   RPR:    HBsAg:    HIV:    GBS:     Assessment/Plan: IUP at 38 w 3 days SROM Labor NSVD Easton Fetty L 05/11/2017, 10:19 PM

## 2017-05-11 NOTE — Anesthesia Preprocedure Evaluation (Signed)
Anesthesia Evaluation  Patient identified by MRN, date of birth, ID band Patient awake    Reviewed: Allergy & Precautions, Patient's Chart, lab work & pertinent test results  Airway Mallampati: II  TM Distance: >3 FB Neck ROM: Full    Dental no notable dental hx.    Pulmonary asthma ,    Pulmonary exam normal breath sounds clear to auscultation       Cardiovascular negative cardio ROS Normal cardiovascular exam Rhythm:Regular Rate:Normal     Neuro/Psych    GI/Hepatic   Endo/Other    Renal/GU      Musculoskeletal   Abdominal   Peds  Hematology   Anesthesia Other Findings   Reproductive/Obstetrics (+) Pregnancy                             Lab Results  Component Value Date   WBC 7.5 05/11/2017   HGB 13.9 05/11/2017   HCT 39.7 05/11/2017   MCV 90.0 05/11/2017   PLT 241 05/11/2017    Anesthesia Physical Anesthesia Plan  ASA: III  Anesthesia Plan: Epidural   Post-op Pain Management:    Induction:   PONV Risk Score and Plan:   Airway Management Planned:   Additional Equipment:   Intra-op Plan:   Post-operative Plan:   Informed Consent: I have reviewed the patients History and Physical, chart, labs and discussed the procedure including the risks, benefits and alternatives for the proposed anesthesia with the patient or authorized representative who has indicated his/her understanding and acceptance.     Plan Discussed with:   Anesthesia Plan Comments:         Anesthesia Quick Evaluation

## 2017-05-12 ENCOUNTER — Other Ambulatory Visit: Payer: Self-pay

## 2017-05-12 LAB — CBC
HEMATOCRIT: 35.7 % — AB (ref 36.0–46.0)
HEMOGLOBIN: 12.3 g/dL (ref 12.0–15.0)
MCH: 31.1 pg (ref 26.0–34.0)
MCHC: 34.5 g/dL (ref 30.0–36.0)
MCV: 90.4 fL (ref 78.0–100.0)
Platelets: 206 10*3/uL (ref 150–400)
RBC: 3.95 MIL/uL (ref 3.87–5.11)
RDW: 12.9 % (ref 11.5–15.5)
WBC: 12.5 10*3/uL — ABNORMAL HIGH (ref 4.0–10.5)

## 2017-05-12 LAB — RPR: RPR: NONREACTIVE

## 2017-05-12 MED ORDER — FLEET ENEMA 7-19 GM/118ML RE ENEM: 1.0000 | ENEMA | Freq: Every day | RECTAL | Status: DC | PRN

## 2017-05-12 MED ORDER — ZOLPIDEM TARTRATE 5 MG PO TABS: 5.0000 mg | ORAL_TABLET | Freq: Every evening | ORAL | Status: DC | PRN

## 2017-05-12 MED ORDER — DIPHENHYDRAMINE HCL 25 MG PO CAPS: 25.0000 mg | ORAL_CAPSULE | Freq: Four times a day (QID) | ORAL | Status: DC | PRN

## 2017-05-12 MED ORDER — IBUPROFEN 600 MG PO TABS
600.0000 mg | ORAL_TABLET | Freq: Four times a day (QID) | ORAL | Status: DC
  Administered 2017-05-12 – 2017-05-13 (×7): 600 mg via ORAL
  Filled 2017-05-12 (×6): qty 1

## 2017-05-12 MED ORDER — WITCH HAZEL-GLYCERIN EX PADS: 1.0000 "application " | MEDICATED_PAD | CUTANEOUS | Status: DC | PRN

## 2017-05-12 MED ORDER — OXYCODONE-ACETAMINOPHEN 5-325 MG PO TABS
1.0000 | ORAL_TABLET | ORAL | Status: DC | PRN
  Administered 2017-05-12 (×2): 1 via ORAL
  Filled 2017-05-12 (×3): qty 1

## 2017-05-12 MED ORDER — OXYCODONE-ACETAMINOPHEN 5-325 MG PO TABS: 2.0000 | ORAL_TABLET | ORAL | Status: DC | PRN

## 2017-05-12 MED ORDER — COCONUT OIL OIL
1.0000 "application " | TOPICAL_OIL | Status: DC | PRN
  Administered 2017-05-12: 1 via TOPICAL
  Filled 2017-05-12: qty 120

## 2017-05-12 MED ORDER — BISACODYL 10 MG RE SUPP: 10.0000 mg | Freq: Every day | RECTAL | Status: DC | PRN

## 2017-05-12 MED ORDER — MEDROXYPROGESTERONE ACETATE 150 MG/ML IM SUSP: 150.0000 mg | INTRAMUSCULAR | Status: DC | PRN

## 2017-05-12 MED ORDER — ONDANSETRON HCL 4 MG PO TABS: 4.0000 mg | ORAL_TABLET | ORAL | Status: DC | PRN

## 2017-05-12 MED ORDER — DIBUCAINE 1 % RE OINT: 1.0000 "application " | TOPICAL_OINTMENT | RECTAL | Status: DC | PRN

## 2017-05-12 MED ORDER — ONDANSETRON HCL 4 MG/2ML IJ SOLN: 4.0000 mg | INTRAMUSCULAR | Status: DC | PRN

## 2017-05-12 MED ORDER — ACETAMINOPHEN 325 MG PO TABS
650.0000 mg | ORAL_TABLET | ORAL | Status: DC | PRN
  Administered 2017-05-12 (×2): 650 mg via ORAL
  Filled 2017-05-12 (×2): qty 2

## 2017-05-12 MED ORDER — MEASLES, MUMPS & RUBELLA VAC ~~LOC~~ INJ
0.5000 mL | INJECTION | Freq: Once | SUBCUTANEOUS | Status: DC
  Filled 2017-05-12: qty 0.5

## 2017-05-12 MED ORDER — BENZOCAINE-MENTHOL 20-0.5 % EX AERO
1.0000 "application " | INHALATION_SPRAY | CUTANEOUS | Status: DC | PRN
  Filled 2017-05-12 (×2): qty 56

## 2017-05-12 MED ORDER — ALBUTEROL SULFATE (2.5 MG/3ML) 0.083% IN NEBU: 3.0000 mL | INHALATION_SOLUTION | Freq: Four times a day (QID) | RESPIRATORY_TRACT | Status: DC | PRN

## 2017-05-12 MED ORDER — SENNOSIDES-DOCUSATE SODIUM 8.6-50 MG PO TABS
2.0000 | ORAL_TABLET | ORAL | Status: DC
  Administered 2017-05-13: 2 via ORAL
  Filled 2017-05-12 (×2): qty 2

## 2017-05-12 MED ORDER — TETANUS-DIPHTH-ACELL PERTUSSIS 5-2.5-18.5 LF-MCG/0.5 IM SUSP: 0.5000 mL | Freq: Once | INTRAMUSCULAR | Status: DC

## 2017-05-12 MED ORDER — SIMETHICONE 80 MG PO CHEW: 80.0000 mg | CHEWABLE_TABLET | ORAL | Status: DC | PRN

## 2017-05-12 MED ORDER — PRENATAL MULTIVITAMIN CH
1.0000 | ORAL_TABLET | Freq: Every day | ORAL | Status: DC
  Administered 2017-05-12 – 2017-05-13 (×2): 1 via ORAL
  Filled 2017-05-12 (×2): qty 1

## 2017-05-12 NOTE — Progress Notes (Signed)
Patient doing well No complaints BP (!) 101/53 (BP Location: Left Arm)   Pulse 72   Temp 98.7 F (37.1 C) (Oral)   Resp 16   Ht 5\' 6"  (1.676 m)   Wt 100.2 kg (221 lb)   LMP 08/15/2016   SpO2 100%   Breastfeeding? Unknown   BMI 35.67 kg/m  Results for orders placed or performed during the hospital encounter of 05/11/17 (from the past 24 hour(s))  CBC     Status: None   Collection Time: 05/11/17  7:39 PM  Result Value Ref Range   WBC 7.5 4.0 - 10.5 K/uL   RBC 4.41 3.87 - 5.11 MIL/uL   Hemoglobin 13.9 12.0 - 15.0 g/dL   HCT 39.7 36.0 - 46.0 %   MCV 90.0 78.0 - 100.0 fL   MCH 31.5 26.0 - 34.0 pg   MCHC 35.0 30.0 - 36.0 g/dL   RDW 12.9 11.5 - 15.5 %   Platelets 241 150 - 400 K/uL  RPR     Status: None   Collection Time: 05/11/17  7:39 PM  Result Value Ref Range   RPR Ser Ql Non Reactive Non Reactive  Type and screen [place]     Status: None   Collection Time: 05/11/17  7:39 PM  Result Value Ref Range   ABO/RH(D) O POS    Antibody Screen NEG    Sample Expiration      05/14/2017 Performed at [facility], [address] [place], [place] [address]   CBC     Status: Abnormal   Collection Time: 05/12/17  6:02 AM  Result Value Ref Range   WBC 12.5 (H) 4.0 - 10.5 K/uL   RBC 3.95 3.87 - 5.11 MIL/uL   Hemoglobin 12.3 12.0 - 15.0 g/dL   HCT 35.7 (L) 36.0 - 46.0 %   MCV 90.4 78.0 - 100.0 fL   MCH 31.1 26.0 - 34.0 pg   MCHC 34.5 30.0 - 36.0 g/dL   RDW 12.9 11.5 - 15.5 %   Platelets 206 150 - 400 K/uL   Abdomen is soft and non tender  PPD # 1  Doing well Routine care Desires circ - consented

## 2017-05-12 NOTE — Anesthesia Postprocedure Evaluation (Signed)
Anesthesia Post Note  Patient: Lauren Leonard  Procedure(s) Performed: AN AD HOC LABOR EPIDURAL     Anesthesia Type: Epidural Level of consciousness: awake and alert Pain management: pain level controlled Vital Signs Assessment: post-procedure vital signs reviewed and stable Respiratory status: spontaneous breathing Cardiovascular status: stable Postop Assessment: no headache, patient able to bend at knees, no backache, no apparent nausea or vomiting, epidural receding and adequate PO intake Anesthetic complications: no    Last Vitals:  Vitals:   05/12/17 0500 05/12/17 0858  BP: (!) 101/53 112/62  Pulse:  62  Resp: 16 18  Temp: 37.1 C 36.9 C  SpO2:  98%    Last Pain:  Vitals:   05/12/17 0858  TempSrc: Oral  PainSc: 5    Pain Goal: Patients Stated Pain Goal: 3 (05/12/17 0858)               Ailene Ards

## 2017-05-12 NOTE — Clinical Social Work Note (Signed)
..  MOB was referred for history of depression.    Referral is screened out by Clinical Social Worker because of the following criteria:  -No history of anxiety/depression during this pregnancy or of post-partum depression. -No reports impacting the pregnancy or her transition to the postpartum period -No diagnosis of anxiety and/or depression within last 3 years - No History of depression due to pregnancy loss/loss of child -Chart review reflects no depression diagnosis or medication provided for any mental health diagnosis  CSW does not deem it clinically necessary to further investigate at this time. Please contact the Clinical Social Worker if needs arise or upon MOB request.

## 2017-05-12 NOTE — Lactation Note (Signed)
This note was copied from a baby's chart. Lactation Consultation Note Baby 5 hrs old. Baby crying a lot. Mom putting baby to breast, suck a few times then sleeps wakes then cries.  FOB holding baby, baby fussy. Baby acts as if may have a head ache. Has cone shaped head, tender to touch. FOB stated has bruise to forehead. Encouraged to support baby's neck and not touching head. Encouraged STS to help calm baby. Baby resting quietly. FOB holding baby. Mom has tubular breast w/everted nipples. Breast heavy. Hand expression taught w/colostrum noted. Mom excited.  Newborn feeding habits, STS, I&O, cluster feeding, supply and demand discussed. Mom encouraged to feed baby 8-12 times/24 hours and with feeding cues.  Encouraged to call for assistance if needed. [place] brochure given w/resources, support groups and [place] services.  Patient Name: Lauren Leonard [id number] Date: 05/12/2017 Reason for consult: Initial assessment   Maternal Data Has patient been taught Hand Expression?: Yes Does the patient have breastfeeding experience prior to this delivery?: No  Feeding Feeding Type: Breast Fed Length of feed: 20 min  LATCH Score       Type of Nipple: Everted at rest and after stimulation  Comfort (Breast/Nipple): Filling, red/small blisters or bruises, mild/mod discomfort  Hold (Positioning): No assistance needed to correctly position infant at breast.     Interventions Interventions: Breast feeding basics reviewed;Breast compression;Breast massage;Hand express;Position options  Lactation Tools Discussed/Used WIC Program: No   Consult Status Consult Status: Follow-up Date: 05/13/17 Follow-up type: In-patient    Theodoro Kalata 05/12/2017, 3:18 AM

## 2017-05-13 MED ORDER — IBUPROFEN 600 MG PO TABS: 600.0000 mg | ORAL_TABLET | Freq: Four times a day (QID) | ORAL | 0 refills | Status: DC

## 2017-05-13 NOTE — Lactation Note (Signed)
This note was copied from a baby's chart. Lactation Consultation Note  Patient Name: Lauren Leonard ASUOR'V Date: 05/13/2017 Reason for consult: Follow-up assessment;Mother's request  Mom requested basic assist.  Mom started to use cradle hold.  Recommended using cross cradle hold.  Mom assisted with using good head support and breast sandwiching.  Mom taught to wait for baby to open widely before bringing his quickly onto breast.  Showed FOB how to tug on chin to facilitate lip flanging, and opening wider.    Swallows identified with Mom and FOB.  Taught Mom to use alternate breast compression to increase milk transfer.  Basics reviewed. Encouraged Mom to call for concerns.  Consult Status Consult Status: Follow-up Date: 05/13/17 Follow-up type: Call as needed    Lauren Leonard 05/13/2017, 12:35 PM

## 2017-05-13 NOTE — Discharge Summary (Signed)
Obstetric Discharge Summary Reason for Admission: onset of labor Prenatal Procedures: none Intrapartum Procedures: spontaneous vaginal delivery Postpartum Procedures: none Complications-Operative and Postpartum: 2nd degree perineal laceration Hemoglobin  Date Value Ref Range Status  05/12/2017 12.3 12.0 - 15.0 g/dL Final   HCT  Date Value Ref Range Status  05/12/2017 35.7 (L) 36.0 - 46.0 % Final    Physical Exam:  General: alert, cooperative and appears stated age 27: appropriate Uterine Fundus: firm Incision: healing well, no significant drainage, no dehiscence DVT Evaluation: No evidence of DVT seen on physical exam.  Discharge Diagnoses: Term Pregnancy-delivered  Discharge Information: Date: 05/13/2017 Activity: pelvic rest Diet: routine Medications: Ibuprofen Condition: improved Instructions: refer to practice specific booklet Discharge to: home   Newborn Data: Live born female  Birth Weight: 8 lb 0.8 oz (3650 g) APGAR: 9, 9  Newborn Delivery   Birth date/time:  05/11/2018 22:07:00 Delivery type:  Vaginal, Spontaneous     Home with mother.  Gleb Mcguire L 05/13/2017, 7:57 AM

## 2017-05-13 NOTE — Lactation Note (Signed)
This note was copied from a baby's chart. Lactation Consultation Note  Patient Name: Lauren Leonard HXTAV'W Date: 05/13/2017   Visited with Lauren Leonard on day of discharge, baby 71 hrs old, and at 6% weight loss.  Output 2 voids and 2 stools.   Circumcision this am.  Baby sleeping swaddled on Lauren Leonard's chest.  Recommended she unwrap and place baby STS on her chest.  Lauren Leonard states she is dirty and plans to shower, and then will.  Baby cluster fed over night.    Talked about importance of a deep and wide latch to breast.  Lauren Leonard reports some slight soreness on initial latch.  Using coconut oil.  Lauren Leonard states discomfort improving.   Encouraged Lauren Leonard to call for latch assist and assessment at next feeding.  Goal is 8-12 feedings per 24 hrs.  Keeping baby STS and feeding baby when he cues is recommended.   Breast massage and hand expression prior to latching recommended, to start milk flow.  Lauren Leonard aware of OP lactation services.  Encouraged her to call prn for assistance or concerns.  Lauren Leonard received a Lansinoh DEBP from insurance.  Lauren Leonard encouraged to wait 4-6 weeks before introducing bottles, unless medically necessary.    Broadus John 05/13/2017, 9:21 AM

## 2017-08-14 ENCOUNTER — Telehealth (HOSPITAL_COMMUNITY): Payer: Self-pay

## 2017-08-14 NOTE — Telephone Encounter (Signed)
Mother was phoned to follow up with concerns about her education during hospital stay. Mother  reports that she wanted  to schedule a outpatient visit. Mother phoned [phone] and left a message to get a return call. Mother reports that she never got a call back from the office to schedule a appointment.   Mother reports that she had limited instruction from the first [facility] that saw her. She reports that she came in and had another nurse with her. Mother reports that the [facility] ask her a lot of questions about how the baby was latching. Mother reported to this [place] that this was her  first baby and she really didn't know if baby was latching ok or not.   Mother reports that the second [facility] taught her how to latch the baby and went over technique in depth. Mother reports that second [place] spent a lot of time with her and seemed to have patience with her.  Mother  felt like she needed more teaching on the first visit.   This LC informed mother that education was normally offered on the first visit to advise mother of proper latch technique and basic breastfeeding teaching was taught.   Mother was given an apology for not meeting her educational needs while in the hospital . Mother was offered to phone and schedule an outpatient visit for her.  This LC took history of mothers breastfeeding experience up to present. Mother reports that she has a low supply . She has been supplementing infant with ebm and formula. Mother reports that she would like to schedule a outpatient visit to get assistance with pumping and increase supply. Lots of tips on power pumping and increasing milk supply given to mother.  Mother was informed that this [place] would phone office and give her information to get a call for a appt.   This [place] phoned clinic and spoke with [facility] . She reported that she would have someone from the front office to call mother.   31. Mother was phoned back by this [facility] and pt now has a LC appt. Scheduled.

## 2017-08-23 ENCOUNTER — Ambulatory Visit: Payer: Self-pay

## 2017-08-23 NOTE — Lactation Note (Addendum)
This note was copied from a baby's chart.  08/23/2017  Name: Lauren Leonard MRN: [id number] Date of Birth: 05/11/2018 Gestational Age: Gestational Age: [redacted]w[redacted]d Birth Weight: 128.8 oz Weight today:    15 pounds 14.6 ounces (7220 grams) with clean size 1 diaper  Mom is here with Lauren Leonard today for feeding assessment. Mom reports her goal is to increase her supply and has enough stored to "be away" for 1-2 days.   MJ has gained 3790 grams in the last 103 days with an average daily weight gain of 37 grams a day.   Mom reports infant is fed breast milk and formula. Infant feeds about every 2-3 hours. Infant bottle feeds during the day at daycare and takes about 5 ounces of formula or breast milk at a feeding. He BF twice in the evening and 1-3 times at night, sometimes he gets a bottle at night. Mom reports infant takes an hour to BF and is very distractible at times. Mom reports she switches breast halfway through feeding. Enc mom to allow infant to empty one breast before offering second breast. Enc mom to offer breast to infant when she is with him so he can stimulate milk supply. Mom reports infant is given bottles of water with Karo syrup and was told it was ok by the Ped.   Mom is back to work and infant in day care. Mom is on Facebook BF support groups.   Mom reports when she is sick, she pumps and dumps her milk. Mom reports she thought she had sinus issues. Enc mom to feed infant the milk and explained antibodies and the benefit to infant.   Mom is taking Benadryl every night and is taking Placenta Encapsulation pills. Discussed that both can  lower milk supply and may not be helping her milk supply.  Mom is also taking Claritin and asked about Zyrtec and Allegra. Told her all of these are ok to take while BF, she reports she has allergies.   Infant is 21 months old and is very distractible. Enc mom to take infant to quiet place to nurse as needed. Mom reports  infant is less distracted at  home than he was in the office today.    Infant has had a history of reflux and was on medication, he is off the meds now and is spitting again per mom. Infant with a few wet burps in the office.    Mom latched infant to the right breast after several attempts, infant shallow and noted to be clicking through feeding. Infant transferred 50 ml in about 10 minutes. Infant very distractible and needed lights dimmed, reassured mom that this is normal for his age. Infant then latched to the left breast off and on and fed for 15 minutes, he transferred 18 ml. Infant content post feeding. Infant did go back on and nurse again after redressed. He fell asleep post BF and drifted off to sleep.   Infant with thick labial frenulum that inserts in a divot at the bottom of the gum ridge. Infant with thin posterior lingual frenulum. Infant with shallow latch to the breast. Infant clicks on the breast throughout feeding.  Infant with tongue thrusting when offering breast or finger. Nipple is rounded when infant comes off and reddened at the nipple base. Mom denies pain with latch. Infant bites at the breast.  Infant with good tongue mobility. Mom was given information on Tongue/lip restriction  websites and local providers to research and learn about  tongue/ip restrictions and pursue if she and dad want. Mom reports dad and several of his family members. has gap between his teeth.   Mom asking questions about drinking alcohol and BF. Reviewed at 1-2 hours of no BF after 1 beer, wine, or mixed drink. Reviewed pumping for 24 hours and dumping if she has several drinks or gets drunk. Mom voiced understanding.   Infant to follow up with Ped on June 25th.   Infant to follow up with Lactation as needed. Mom is aware of BF Support Groups/mom groups. Mom to call with questions/cocnerns as needed.   Referred mom to [email or web address] for help with BF questions.   Mom was told by another [facility] that this visit would be free, she asked  about future charges for visits if scheduled, discussed that we file with insurance after the visit. Mom asked about a copay, told her to ask insurance company.    General Information: Mother's reason for visit: wants to make sure everything is going well Consult: Initial Lactation consultant: Lauren Mattes RN,IBCLC Breastfeeding experience: pulling on and off  Maternal medical conditions: History post partum depression Maternal medications: Pre-natal vitamin, Other(Benadryl QD, Placental Encapsulation Capsules QD, Claritin QD)  Breastfeeding History: Frequency of breast feeding: 2 x  in the evening, and 1-3 x a night  Duration of feeding: up to an hour  Supplementation: Supplement method: bottle(Dr. Brown's Lansinoh, Tommie Tippee, or Nuk) Brand: Fawn Kirk Formula volume: 4-5 ounces Formula frequency: more than half of feedings   Breast milk volume: 4-5 ounces Breast milk frequency: less than half of feedings   Pump type: Other(Lansinoh) Pump frequency: 3-4 x a day Pump volume: 6-8 ounces  Infant Output Assessment: Voids per 24 hours: 10 Urine color: Clear yellow Stools per 24 hours: not every day Stool color: Brown  Breast Assessment: Breast: Soft Nipple: Erect Pain level: (only with biting at the breast) Pain interventions: Bra  Feeding Assessment: Infant oral assessment: Variance Infant oral assessment comment: Infant with thick labial frenulum that inserts in a divot at the bottom of the gum ridge. Infant with thin posterior lingual frenulum. Infant with shallow latch to the breast. Infant clicks on the breast throughout feeding.  Infant with tongue thrusting when offering breast or finger. Nipple is rounded when infant comes off and reddened at the nipple base. Mom denies pain with latch. Infant bites at the breast.  Infant with good tongue mobility. Positioning: Cradle(right breast) Latch: 1 - Repeated attempts needed to sustain latch, nipple held in mouth  throughout feeding, stimulation needed to elicit sucking reflex. Audible swallowing: 2 - Spontaneous and intermittent Type of nipple: 2 - Everted at rest and after stimulation Comfort: 2 - Soft/non-tender Hold: 2 - No assistance needed to correctly position infant at breast LATCH score: 9 Latch assessment: Shallow Lips flanged: Yes Suck assessment: Displays both   Pre-feed weight: 7220 grams Post feed weight: 7270 grams Amount transferred: 50 ml Amount supplemented: 0  Additional Feeding Assessment: Infant oral assessment: Variance Infant oral assessment comment: see above Positioning: Cradle(left breast) Latch: 1 - Repeated attempts neede to sustain latch, nipple held in mouth throughout feeding, stimulation needed to elicit sucking reflex. Audible swallowing: 2 - Spontaneous and intermittent Type of nipple: 2 - Everted at rest and after stimulation Comfort: 2 - Soft/non-tender Hold: 2 - No assistance needed to correctly position infant at breast LATCH score: 9 Latch assessment: Shallow Lips flanged: Yes Suck assessment: Displays both   Pre-feed weight: 7270 grams Post feed weight: 7288  grams Amount transferred: 18 ml Amount supplemented: 0  Totals: Total amount transferred: 68 ml Total supplement given: 0 Total amount pumped post feed: 0   Plan: 1. Offer breast with feeding cues 2. Try to nurse infant anytime when you are home with him 3. Offer infant a bottle when mom is not with infant 30. Empty one breast before offering the second breast 5. Continue pumping when not with infant about every 3 hours with double electric breast pump, pump after breast feeding a few times a day to increase supply or in place of breast feeding (when he is getting a bottle) 6. Massage breasts when pumping and using your hands free bra.  7. Infant need about 25-32 ounces of milk every day 8. When giving infant bottle try the slower flow nipple such as the Dr. Saul Fordyce nipple 9. Consider  stopping daily Benadryl to see if it helps supply 10. Consider Moringa taken as bottle says after talking with your OB (Found at [employer])  11. Keep up the good work 75. Thank you for allowing me to assist you today 13. Please call with any questions/concerns as needed 14. Follow up with Lactation as needed     Donn Pierini RN, IBCLC                                                        Lauren Leonard 08/23/2017, 2:18 PM

## 2017-09-26 ENCOUNTER — Other Ambulatory Visit: Payer: Self-pay

## 2017-09-26 ENCOUNTER — Encounter: Payer: Self-pay | Admitting: Podiatry

## 2017-09-26 ENCOUNTER — Ambulatory Visit: Payer: BC Managed Care – PPO | Admitting: Podiatry

## 2017-09-26 DIAGNOSIS — L84 Corns and callosities: Secondary | ICD-10-CM | POA: Diagnosis not present

## 2017-09-26 DIAGNOSIS — IMO0002 Reserved for concepts with insufficient information to code with codable children: Secondary | ICD-10-CM | POA: Insufficient documentation

## 2017-09-26 DIAGNOSIS — L603 Nail dystrophy: Secondary | ICD-10-CM | POA: Diagnosis not present

## 2017-09-26 DIAGNOSIS — R87619 Unspecified abnormal cytological findings in specimens from cervix uteri: Secondary | ICD-10-CM | POA: Insufficient documentation

## 2017-09-26 NOTE — Patient Instructions (Signed)
Recommend AmLactin lotion to the bilateral heels Recommend biotin supplement, vitamin E oil, and urea topical strengthen and soften the toenails.

## 2017-09-30 NOTE — Progress Notes (Signed)
   Subjective: 27 year old female presenting today as a new patient with a chief complaint of discoloration and thickening of the bilateral toenails that have been present for the past several months. She has not done anything for treatment. There are no modifying factors noted. Patient is here for further evaluation and treatment.     Past Medical History:  Diagnosis Date  . Allergy    sinus   . Asthma   . Headache    migraines  . IBS (irritable bowel syndrome)   . Ovarian cyst     Objective: Physical Exam General: The patient is alert and oriented x3 in no acute distress.  Dermatology: Hyperkeratotic, discolored, thickened, onychodystrophy of nails noted bilaterally. Hyperkeratotic lesions present on the bilateral heels. Pain on palpation with a central nucleated core noted. Skin is warm, dry and supple bilateral lower extremities. Negative for open lesions or macerations.  Vascular: Palpable pedal pulses bilaterally. No edema or erythema noted. Capillary refill within normal limits.  Neurological: Epicritic and protective threshold grossly intact bilaterally.   Musculoskeletal Exam: Range of motion within normal limits to all pedal and ankle joints bilateral. Muscle strength 5/5 in all groups bilateral.   Assessment: #1 onychodystrophy bilateral toenails #2 possible onychomycosis #3 hyperkeratotic nails bilateral #4 callus lesions noted to bilateral heels   Plan of Care:  #1 Patient was evaluated. #2 Recommended AmLactin lotion to bilateral heel calluses.  #3 Recommended Biotin, Vitamin E oil and topical urea to soften and strengthen toenails.  #4 Return to clinic as needed.    Edrick Kins, DPM [facility]  Dr. Edrick Kins, [address]                                        [place], [place] [address]                Office [phone]  Fax [phone]

## 2018-02-26 ENCOUNTER — Institutional Professional Consult (permissible substitution): Payer: BC Managed Care – PPO | Admitting: Diagnostic Neuroimaging

## 2018-02-26 ENCOUNTER — Telehealth: Payer: Self-pay | Admitting: *Deleted

## 2018-02-26 NOTE — Telephone Encounter (Signed)
Patient had 1 pm appointment today, was to arrive at 12:30. She called phone staff at 1 pm and stated she was 15 minutes out. Phone staff skyped this RN who agreed to give patient the 2 pm opening today, no penalty.  The patient was advised to arrive at 1:30 to check in and be seen at 2 pm today. Dr Leta Baptist was made aware. The patient checked in at 1:41 pm, stated she did not have co pay with her and had expected to have an MRI today, not a consultation first. The patient chose to leave and stated she would call back to reschedule. Dr Leta Baptist was informed.

## 2018-02-27 ENCOUNTER — Encounter: Payer: Self-pay | Admitting: Diagnostic Neuroimaging

## 2018-03-07 ENCOUNTER — Other Ambulatory Visit: Payer: Self-pay

## 2018-03-07 ENCOUNTER — Emergency Department (HOSPITAL_COMMUNITY)
Admission: EM | Admit: 2018-03-07 | Discharge: 2018-03-07 | Disposition: A | Discharge status: Home / Self Care | Payer: BC Managed Care – PPO | Attending: Emergency Medicine | Admitting: Emergency Medicine

## 2018-03-07 ENCOUNTER — Encounter (HOSPITAL_COMMUNITY): Payer: Self-pay | Admitting: Emergency Medicine

## 2018-03-07 DIAGNOSIS — Z91040 Latex allergy status: Secondary | ICD-10-CM | POA: Diagnosis not present

## 2018-03-07 DIAGNOSIS — J029 Acute pharyngitis, unspecified: Secondary | ICD-10-CM | POA: Diagnosis not present

## 2018-03-07 DIAGNOSIS — J45909 Unspecified asthma, uncomplicated: Secondary | ICD-10-CM | POA: Diagnosis not present

## 2018-03-07 DIAGNOSIS — Z79899 Other long term (current) drug therapy: Secondary | ICD-10-CM | POA: Insufficient documentation

## 2018-03-07 LAB — GROUP A STREP BY PCR: Group A Strep by PCR: NOT DETECTED

## 2018-03-07 MED ORDER — AMOXICILLIN 500 MG PO CAPS: 500.0000 mg | ORAL_CAPSULE | Freq: Three times a day (TID) | ORAL | 0 refills | Status: DC

## 2018-03-07 MED ORDER — LIDOCAINE VISCOUS HCL 2 % MT SOLN: 15.0000 mL | Freq: Four times a day (QID) | OROMUCOSAL | 0 refills | Status: DC | PRN

## 2018-03-07 MED ORDER — PANTOPRAZOLE SODIUM 40 MG PO TBEC: 40.0000 mg | DELAYED_RELEASE_TABLET | Freq: Every day | ORAL | 0 refills | Status: DC

## 2018-03-07 NOTE — ED Triage Notes (Signed)
Pt c/o sore throat for the last 3 to 4 weeks

## 2018-03-07 NOTE — ED Provider Notes (Signed)
[place] [facility] DEPT Provider Note   CSN: [id number] Arrival date & time: 03/07/18  2121     History   Chief Complaint Chief Complaint  Patient presents with  . Sore Throat    HPI Lauren Leonard is a 27 y.o. female.  She is complaining of 2+ weeks of sore throat.  She says she has a burning pain in her throat and makes it feel like she is having trouble breathing.  She went to an urgent care where they gave her a dose of steroids and then she went back again to urgent care and they gave her another course of steroids.  She thinks that might of done a strep test but she did not get any results.  She has not been on antibiotics.  No fevers or chills.  She does have a history of acid reflux and she has been off her antacid medication since her pregnancy.  The history is provided by the patient.  Sore Throat  This is a new problem. The current episode started more than 1 week ago. The problem occurs constantly. The problem has been gradually worsening. Associated symptoms include shortness of breath. Pertinent negatives include no chest pain, no abdominal pain and no headaches. The symptoms are aggravated by swallowing. Nothing relieves the symptoms. She has tried acetaminophen for the symptoms. The treatment provided no relief.    Past Medical History:  Diagnosis Date  . Allergy    sinus   . Asthma   . Headache    migraines  . IBS (irritable bowel syndrome)   . Ovarian cyst     Patient Active Problem List   Diagnosis Date Noted  . Abnormal Pap smear 09/26/2017  . NSVD (normal spontaneous vaginal delivery) 05/12/2017  . Indication for care in labor or delivery 05/11/2017  . Short cervix during pregnancy in second trimester 01/22/2017  . Chlamydial infection 08/28/2014  . Asthma 08/28/2014  . Abnormal Pap smear of cervix 08/28/2014  . Headache(784.0) 08/29/2013  . Headache 08/29/2013  . Allergic rhinitis 05/09/2011    Past Surgical History:    Procedure Laterality Date  . CHOLECYSTECTOMY  06/28/12   lap chole  . CHOLECYSTECTOMY       OB History    Gravida  2   Para  1   Term  1   Preterm      AB  1   Living  1     SAB  1   TAB      Ectopic      Multiple  0   Live Births  1            Home Medications    Prior to Admission medications   Medication Sig Start Date End Date Taking? Authorizing Provider  acetaminophen (TYLENOL) 500 MG tablet Take 1,000 mg every 6 (six) hours as needed by mouth for mild pain.    [provider]  albuterol (PROVENTIL HFA;VENTOLIN HFA) 108 (90 BASE) MCG/ACT inhaler Inhale 2 puffs into the lungs every 6 (six) hours as needed for wheezing or shortness of breath. 06/05/14   Tysinger, Camelia Eng, PA-C  azithromycin (ZITHROMAX) 250 MG tablet TAKE 2 TABLETS BY MOUTH TODAY, THEN TAKE 1 TABLET DAILY FOR 4 DAYS 07/02/17   [provider]  BLISOVI 24 FE 1-20 MG-MCG(24) tablet Take 1 tablet by mouth daily. 09/19/17   [provider]  cetirizine (ZYRTEC) 10 MG tablet Take 10 mg by mouth daily.    [provider]  clobetasol ointment (TEMOVATE) 0.05 % See admin instructions. 07/10/17   [provider]  diphenhydrAMINE (BENADRYL) 25 MG tablet Take 25 mg every 6 (six) hours as needed by mouth for itching (For rash.).    [provider]  Elastic Bandages & Supports (COMFORT FIT MATERNITY SUPP LG) MISC 1 Device by Does not apply route daily. 03/18/17   Lajean Manes, CNM  fluticasone (FLONASE) 50 MCG/ACT nasal spray Place 2 sprays into both nostrils daily.    [provider]  ibuprofen (ADVIL,MOTRIN) 600 MG tablet Take 1 tablet (600 mg total) by mouth every 6 (six) hours. 05/13/17   Dian Queen, MD  lansoprazole (PREVACID) 30 MG capsule Take 30 mg by mouth daily at 12 noon.    [provider]  lubiprostone (AMITIZA) 8 MCG capsule Take by mouth.    [provider]  meloxicam (MOBIC) 15 MG tablet TAKE 1 TABLET BY  MOUTH DAILY AS NEEDED WITH FOOD 09/05/17   [provider]  metoCLOPramide (REGLAN) 10 MG tablet Take 1 tablet (10 mg total) by mouth every 8 (eight) hours as needed for nausea. 01/09/17   Julianne Handler, CNM  ondansetron (ZOFRAN) 8 MG tablet Take by mouth every 8 (eight) hours as needed for nausea or vomiting.    [provider]  pantoprazole (PROTONIX) 40 MG tablet Take 40 mg by mouth daily.    [provider]  Prenat w/o A Vit-FeFum-FePo-FA (PROVIDA OB) 20-20-1.25 MG CAPS TAKE 1 CAPSULE BY MOUTH EVERYDAY AT BEDTIME 08/27/17   [provider]  Prenatal Vit-Fe Fumarate-FA (PRENATAL MULTIVITAMIN) TABS tablet Take 1 tablet at bedtime by mouth.    [provider]  progesterone (PROMETRIUM) 200 MG capsule Take 200 mg by mouth daily. 08/20/17   [provider]  ranitidine (ZANTAC) 300 MG tablet Take 300 mg by mouth at bedtime.    [provider]  sertraline (ZOLOFT) 50 MG tablet Take 50 mg by mouth daily. 09/11/17   [provider]    Family History Family History  Problem Relation Age of Onset  . Cancer Maternal Grandmother        breast  . Diabetes Maternal Grandmother   . Hypertension Maternal Grandmother   . Cancer Maternal Grandfather        panreatic  . Diabetes Maternal Grandfather   . Migraines Mother   . Hypertension Mother   . Cerebral aneurysm Other     Social History Social History   Tobacco Use  . Smoking status: Never Smoker  . Smokeless tobacco: Never Used  Substance Use Topics  . Alcohol use: Yes    Comment: social (a few times/year)none since pregnancy  . Drug use: No     Allergies   Latex and Citric acid   Review of Systems Review of Systems  Constitutional: Negative for fever.  HENT: Positive for sore throat. Negative for nosebleeds.   Eyes: Negative for visual disturbance.  Respiratory: Positive for shortness of breath.   Cardiovascular: Negative for chest pain.  Gastrointestinal:  Negative for abdominal pain.  Genitourinary: Negative for dysuria.  Musculoskeletal: Positive for neck pain. Negative for back pain.  Skin: Negative for rash.  Neurological: Negative for headaches.     Physical Exam Updated Vital Signs BP 116/78 (BP Location: Left Arm)   Pulse 77   Temp 98.6 F (37 C) (Oral)   Resp 18   LMP 03/07/2018   SpO2 100%   Physical Exam Constitutional:      Appearance: She  is well-developed.  HENT:     Head: Normocephalic and atraumatic.     Right Ear: Tympanic membrane normal.     Left Ear: Tympanic membrane normal.     Nose: No rhinorrhea.     Mouth/Throat:     Mouth: Mucous membranes are dry. No oral lesions.     Pharynx: Oropharynx is clear. No pharyngeal swelling, oropharyngeal exudate or posterior oropharyngeal erythema.     Tonsils: No tonsillar exudate or tonsillar abscesses. Swelling: 0 on the right. 0 on the left.  Eyes:     Conjunctiva/sclera: Conjunctivae normal.  Neck:     Musculoskeletal: Neck supple.  Cardiovascular:     Rate and Rhythm: Normal rate and regular rhythm.     Heart sounds: Normal heart sounds.  Pulmonary:     Effort: Pulmonary effort is normal.     Breath sounds: No stridor. No wheezing or rhonchi.  Lymphadenopathy:     Cervical: Cervical adenopathy present.  Skin:    General: Skin is warm and dry.     Capillary Refill: Capillary refill takes less than 2 seconds.  Neurological:     General: No focal deficit present.     Mental Status: She is alert.     GCS: GCS eye subscore is 4. GCS verbal subscore is 5. GCS motor subscore is 6.  Psychiatric:        Mood and Affect: Mood normal.      ED Treatments / Results  Labs (all labs ordered are listed, but only abnormal results are displayed) Labs Reviewed  GROUP A STREP BY PCR    EKG None  Radiology No results found.  Procedures Procedures (including critical care time)  Medications Ordered in ED Medications - No data to display   Initial  Impression / Assessment and Plan / ED Course  I have reviewed the triage vital signs and the nursing notes.  Pertinent labs & imaging results that were available during my care of the patient were reviewed by me and considered in my medical decision making (see chart for details).  Clinical Course as of Mar 07 2309  Thu Mar 07, 2018  2251 Patient's rapid strep is negative.  She had had prior reflux symptoms and so I think getting her back on some ranitidine would be useful.  She is also asking to start on some antibiotics even though her strep test was negative and asking for some topical pain medicine to help with the stinging pain.   [MB]    Clinical Course User Index [MB] Hayden Rasmussen, MD    Final Clinical Impressions(s) / ED Diagnoses   Final diagnoses:  Acute pharyngitis, unspecified etiology    ED Discharge Orders         Ordered    amoxicillin (AMOXIL) 500 MG capsule  3 times daily     03/07/18 2259    lidocaine (XYLOCAINE) 2 % solution  Every 6 hours PRN     03/07/18 2259    pantoprazole (PROTONIX) 40 MG tablet  Daily     03/07/18 2259           Hayden Rasmussen, MD 03/07/18 2310

## 2018-03-07 NOTE — Discharge Instructions (Addendum)
You were seen in the emergency department for 3 weeks of a sore throat.  Your strep test was negative.  We are covering you with an antibiotic and also some topical numbing medicine.  We are also prescribing some antacid medication.  Please follow-up with your doctor and return if any concerns.

## 2018-03-11 ENCOUNTER — Emergency Department (HOSPITAL_COMMUNITY)
Admission: EM | Admit: 2018-03-11 | Discharge: 2018-03-12 | Discharge status: Against Medical Advice | Payer: BC Managed Care – PPO | Attending: Emergency Medicine | Admitting: Emergency Medicine

## 2018-03-11 ENCOUNTER — Other Ambulatory Visit: Payer: Self-pay

## 2018-03-11 DIAGNOSIS — R05 Cough: Secondary | ICD-10-CM | POA: Insufficient documentation

## 2018-03-11 DIAGNOSIS — Z5321 Procedure and treatment not carried out due to patient leaving prior to being seen by health care provider: Secondary | ICD-10-CM | POA: Insufficient documentation

## 2018-03-11 NOTE — ED Triage Notes (Signed)
Patient complain of productive cough started a week ago. Pt stated she feels its getting worse. Pt reported she was seen here 3 days ago with the same problem and was discharge with amoxicillin and cough medicines. Patient denies N/V and fever.

## 2018-03-12 ENCOUNTER — Emergency Department (HOSPITAL_COMMUNITY): Payer: BC Managed Care – PPO

## 2018-03-12 ENCOUNTER — Encounter (HOSPITAL_COMMUNITY): Payer: Self-pay | Admitting: Emergency Medicine

## 2018-04-10 ENCOUNTER — Ambulatory Visit
Admission: EM | Admit: 2018-04-10 | Discharge: 2018-04-10 | Disposition: A | Discharge status: Home / Self Care | Payer: BC Managed Care – PPO

## 2018-04-10 ENCOUNTER — Encounter: Payer: Self-pay | Admitting: Emergency Medicine

## 2018-04-10 DIAGNOSIS — R69 Illness, unspecified: Secondary | ICD-10-CM | POA: Insufficient documentation

## 2018-04-10 DIAGNOSIS — J111 Influenza due to unidentified influenza virus with other respiratory manifestations: Secondary | ICD-10-CM

## 2018-04-10 DIAGNOSIS — R05 Cough: Secondary | ICD-10-CM | POA: Diagnosis not present

## 2018-04-10 DIAGNOSIS — J029 Acute pharyngitis, unspecified: Secondary | ICD-10-CM

## 2018-04-10 MED ORDER — FLUTICASONE PROPIONATE 50 MCG/ACT NA SUSP: 1.0000 | Freq: Every day | NASAL | 2 refills | Status: DC

## 2018-04-10 MED ORDER — IBUPROFEN 600 MG PO TABS: 600.0000 mg | ORAL_TABLET | Freq: Four times a day (QID) | ORAL | 0 refills | Status: DC | PRN

## 2018-04-10 MED ORDER — ONDANSETRON 4 MG PO TBDP: 4.0000 mg | ORAL_TABLET | Freq: Three times a day (TID) | ORAL | 0 refills | Status: DC | PRN

## 2018-04-10 MED ORDER — CETIRIZINE HCL 10 MG PO CAPS: 10.0000 mg | ORAL_CAPSULE | Freq: Every day | ORAL | 0 refills | Status: DC

## 2018-04-10 MED ORDER — HYDROCODONE-HOMATROPINE 5-1.5 MG/5ML PO SYRP: 5.0000 mL | ORAL_SOLUTION | Freq: Four times a day (QID) | ORAL | 0 refills | Status: DC | PRN

## 2018-04-10 NOTE — ED Triage Notes (Signed)
Pt c/o cough, chills, body aches, fever x3 days, went to another UC and given inhaler.

## 2018-04-10 NOTE — Discharge Instructions (Signed)
We are treating you for flu Begin daily cetirizine and flonase for congestion and drainage Robitussin, Delsym or Robitussin DM for cough during day Hycodan for cough at nighttime- will cause drowsiness do not work or drive after taking Please alternate Tylenol and ibuprofen every 4 hours for body aches and fever, please have food on your stomach when you are taking this Use Zofran as needed for nausea and vomiting Drink plenty of fluids  Please follow-up if not seeing any improvement in symptoms towards the end of the weekend, early next week, symptoms worsening, developing persistent fever, shortness of breath, difficulty breathing chest discomfort

## 2018-04-10 NOTE — ED Provider Notes (Signed)
[name]    CSN: [id number] Arrival date & time: 04/10/18  1000     History   Chief Complaint Chief Complaint  Patient presents with  . URI    HPI Lauren Leonard is a 28 y.o. female history of asthma presenting today for evaluation of URI symptoms.  Patient states that she has ha a productive cough, chills, body aches, fever and now sore throat.  Her fever has been up to 101.8.  Symptoms have been going on for the past 3 days.  Last night she started to develop some nausea vomiting and diarrhea.  She has tried Mucinex and Robitussin.  Denies shortness of breath.  HPI  Past Medical History:  Diagnosis Date  . Allergy    sinus   . Asthma   . Headache    migraines  . IBS (irritable bowel syndrome)   . Ovarian cyst     Patient Active Problem List   Diagnosis Date Noted  . Abnormal Pap smear 09/26/2017  . NSVD (normal spontaneous vaginal delivery) 05/12/2017  . Indication for care in labor or delivery 05/11/2017  . Short cervix during pregnancy in second trimester 01/22/2017  . Chlamydial infection 08/28/2014  . Asthma 08/28/2014  . Abnormal Pap smear of cervix 08/28/2014  . Headache(784.0) 08/29/2013  . Headache 08/29/2013  . Allergic rhinitis 05/09/2011    Past Surgical History:  Procedure Laterality Date  . CHOLECYSTECTOMY  06/28/12   lap chole  . CHOLECYSTECTOMY      OB History    Gravida  2   Para  1   Term  1   Preterm      AB  1   Living  1     SAB  1   TAB      Ectopic      Multiple  0   Live Births  1            Home Medications    Prior to Admission medications   Medication Sig Start Date End Date Taking? Authorizing Provider  acetaminophen (TYLENOL) 500 MG tablet Take 1,000 mg every 6 (six) hours as needed by mouth for mild pain.    [provider]  albuterol (PROVENTIL HFA;VENTOLIN HFA) 108 (90 BASE) MCG/ACT inhaler Inhale 2 puffs into the lungs every 6 (six) hours as needed for wheezing or shortness  of breath. 06/05/14   Tysinger, Camelia Eng, PA-C  BLISOVI 24 FE 1-20 MG-MCG(24) tablet Take 1 tablet by mouth daily. 09/19/17   [provider]  Cetirizine HCl 10 MG CAPS Take 1 capsule (10 mg total) by mouth daily for 10 days. 04/10/18 04/20/18  Berenis Corter C, PA-C  citalopram (CELEXA) 10 MG tablet Take 10 mg by mouth daily. 01/31/18   [provider]  clobetasol ointment (TEMOVATE) 0.05 % See admin instructions. 07/10/17   [provider]  diphenhydrAMINE (BENADRYL) 25 MG tablet Take 25 mg every 6 (six) hours as needed by mouth for itching (For rash.).    [provider]  Elastic Bandages & Supports (COMFORT FIT MATERNITY SUPP LG) MISC 1 Device by Does not apply route daily. 03/18/17   Lajean Manes, CNM  fluticasone (FLONASE) 50 MCG/ACT nasal spray Place 1-2 sprays into both nostrils daily. 04/10/18   Chasya Zenz C, PA-C  HYDROcodone-homatropine (HYCODAN) 5-1.5 MG/5ML syrup Take 5 mLs by mouth every 6 (six) hours as needed for cough. Do not drive or work after using 04/10/18   Darnetta Kesselman, [place] C, PA-C  ibuprofen (ADVIL,MOTRIN) 600 MG tablet Take 1 tablet (600 mg total) by mouth every 6 (six) hours as needed. 04/10/18   Ival Pacer C, PA-C  lansoprazole (PREVACID) 30 MG capsule Take 30 mg by mouth daily at 12 noon.    [provider]  lidocaine (XYLOCAINE) 2 % solution Use as directed 15 mLs in the mouth or throat every 6 (six) hours as needed for mouth pain. 03/07/18   Hayden Rasmussen, MD  lubiprostone (AMITIZA) 8 MCG capsule Take by mouth.    [provider]  meloxicam (MOBIC) 15 MG tablet TAKE 1 TABLET BY MOUTH DAILY AS NEEDED WITH FOOD 09/05/17   [provider]  metoCLOPramide (REGLAN) 10 MG tablet Take 1 tablet (10 mg total) by mouth every 8 (eight) hours as needed for nausea. 01/09/17   Julianne Handler, CNM  ondansetron (ZOFRAN) 8 MG tablet Take by mouth every 8 (eight) hours as needed for nausea or vomiting.    [provider]  ondansetron (ZOFRAN-ODT) 4 MG disintegrating tablet Take 1 tablet (4 mg total) by mouth every 8 (eight) hours as needed for nausea or vomiting. 04/10/18   Karly Pitter C, PA-C  pantoprazole (PROTONIX) 40 MG tablet Take 1 tablet (40 mg total) by mouth daily. 03/07/18   Hayden Rasmussen, MD  Prenat w/o A Vit-FeFum-FePo-FA (PROVIDA OB) 20-20-1.25 MG CAPS TAKE 1 CAPSULE BY MOUTH EVERYDAY AT BEDTIME 08/27/17   [provider]  Prenatal Vit-Fe Fumarate-FA (PRENATAL MULTIVITAMIN) TABS tablet Take 1 tablet at bedtime by mouth.    [provider]  progesterone (PROMETRIUM) 200 MG capsule Take 200 mg by mouth daily. 08/20/17   [provider]  ranitidine (ZANTAC) 300 MG tablet Take 300 mg by mouth at bedtime.    [provider]  sertraline (ZOLOFT) 50 MG tablet Take 50 mg by mouth daily. 09/11/17   [provider]    Family History Family History  Problem Relation Age of Onset  . Cancer Maternal Grandmother        breast  . Diabetes Maternal Grandmother   . Hypertension Maternal Grandmother   . Cancer Maternal Grandfather        panreatic  . Diabetes Maternal Grandfather   . Migraines Mother   . Hypertension Mother   . Cerebral aneurysm Other     Social History Social History   Tobacco Use  . Smoking status: Never Smoker  . Smokeless tobacco: Never Used  Substance Use Topics  . Alcohol use: Yes    Comment: social (a few times/year)none since pregnancy  . Drug use: No     Allergies   Latex and Citric acid   Review of Systems Review of Systems  Constitutional: Positive for appetite change, chills, fatigue and fever. Negative for activity change.  HENT: Positive for congestion, rhinorrhea and sore throat. Negative for ear pain, sinus pressure and trouble swallowing.   Eyes: Negative for discharge and redness.  Respiratory: Positive for cough. Negative for chest tightness and shortness of breath.   Cardiovascular: Negative  for chest pain.  Gastrointestinal: Positive for diarrhea, nausea and vomiting. Negative for abdominal pain.  Musculoskeletal: Positive for arthralgias and myalgias.  Skin: Negative for rash.  Neurological: Positive for headaches. Negative for dizziness and light-headedness.     Physical Exam Triage Vital Signs ED Triage Vitals [04/10/18 1015]  Enc Vitals Group     BP 114/76     Pulse Rate 95     Resp 18     Temp  98.8 F (37.1 C)     Temp src      SpO2 94 %     Weight      Height      Head Circumference      Peak Flow      Pain Score 7     Pain Loc      Pain Edu?      Excl. in [place]?    No data found.  Updated Vital Signs BP 114/76   Pulse 95   Temp 98.8 F (37.1 C)   Resp 18   SpO2 94%  O2 rechecked 96% Visual Acuity Right Eye Distance:   Left Eye Distance:   Bilateral Distance:    Right Eye Near:   Left Eye Near:    Bilateral Near:     Physical Exam Vitals signs and nursing note reviewed.  Constitutional:      General: She is not in acute distress.    Appearance: She is well-developed.  HENT:     Head: Normocephalic and atraumatic.     Ears:     Comments: Bilateral ears without tenderness to palpation of external auricle, tragus and mastoid, EAC's without erythema or swelling, TM's with good bony landmarks and cone of light. Non erythematous.     Nose:     Comments: Nasal mucosa erythematous, swollen turbinates    Mouth/Throat:     Comments: Oral mucosa pink and moist, no tonsillar enlargement or exudate. Posterior pharynx patent and nonerythematous, no uvula deviation or swelling. Normal phonation. Eyes:     Conjunctiva/sclera: Conjunctivae normal.  Neck:     Musculoskeletal: Neck supple.  Cardiovascular:     Rate and Rhythm: Normal rate and regular rhythm.     Heart sounds: No murmur.  Pulmonary:     Effort: Pulmonary effort is normal. No respiratory distress.     Breath sounds: Normal breath sounds.     Comments: Breathing comfortably at rest,  CTABL, no wheezing, rales or other adventitious sounds auscultated Abdominal:     Palpations: Abdomen is soft.     Tenderness: There is abdominal tenderness.     Comments: Mild generalized tenderness throughout abdomen, no focal tenderness, no rebound tenderness  Skin:    General: Skin is warm and dry.  Neurological:     Mental Status: She is alert.      UC Treatments / Results  Labs (all labs ordered are listed, but only abnormal results are displayed) Labs Reviewed - No data to display  EKG None  Radiology No results found.  Procedures Procedures (including critical care time)  Medications Ordered in UC Medications - No data to display  Initial Impression / Assessment and Plan / UC Course  I have reviewed the triage vital signs and the nursing notes.  Pertinent labs & imaging results that were available during my care of the patient were reviewed by me and considered in my medical decision making (see chart for details).     Patient with flulike symptoms x3 days, vital signs stable, most likely viral etiology, influenza likely.  Recommend continued symptomatic and supportive care as patient is outside of Tamiflu window.  Recommendations below.  Continue to monitor, Final Clinical Impressions(s) / UC Diagnoses   Final diagnoses:  Influenza-like illness     Discharge Instructions     We are treating you for flu Begin daily cetirizine and flonase for congestion and drainage Robitussin, Delsym or Robitussin DM for cough during day Hycodan for cough at nighttime-  will cause drowsiness do not work or drive after taking Please alternate Tylenol and ibuprofen every 4 hours for body aches and fever, please have food on your stomach when you are taking this Use Zofran as needed for nausea and vomiting Drink plenty of fluids  Please follow-up if not seeing any improvement in symptoms towards the end of the weekend, early next week, symptoms worsening, developing  persistent fever, shortness of breath, difficulty breathing chest discomfort    ED Prescriptions    Medication Sig Dispense Auth. Provider   ondansetron (ZOFRAN-ODT) 4 MG disintegrating tablet Take 1 tablet (4 mg total) by mouth every 8 (eight) hours as needed for nausea or vomiting. 20 tablet Adrine Hayworth C, PA-C   Cetirizine HCl 10 MG CAPS Take 1 capsule (10 mg total) by mouth daily for 10 days. 10 capsule Lilla Callejo C, PA-C   fluticasone (FLONASE) 50 MCG/ACT nasal spray Place 1-2 sprays into both nostrils daily. 1 g Colston Pyle C, PA-C   HYDROcodone-homatropine (HYCODAN) 5-1.5 MG/5ML syrup Take 5 mLs by mouth every 6 (six) hours as needed for cough. Do not drive or work after using 60 mL Yared Barefoot C, PA-C   ibuprofen (ADVIL,MOTRIN) 600 MG tablet Take 1 tablet (600 mg total) by mouth every 6 (six) hours as needed. 30 tablet Rodriques Badie, [place] C, PA-C     Controlled Substance Prescriptions [place] Controlled Substance Registry consulted? Not Applicable   Janith Lima, [place] 04/10/18 1113

## 2018-05-21 ENCOUNTER — Encounter: Payer: Self-pay | Admitting: Family Medicine

## 2018-05-23 ENCOUNTER — Encounter: Payer: Self-pay | Admitting: Family Medicine

## 2018-05-23 ENCOUNTER — Other Ambulatory Visit: Payer: Self-pay

## 2018-05-23 ENCOUNTER — Ambulatory Visit (INDEPENDENT_AMBULATORY_CARE_PROVIDER_SITE_OTHER): Payer: BC Managed Care – PPO | Admitting: Family Medicine

## 2018-05-23 VITALS — BP 103/67 | HR 75 | Temp 97.7°F | Resp 16 | Ht 65.5 in | Wt 228.0 lb

## 2018-05-23 DIAGNOSIS — Z6837 Body mass index (BMI) 37.0-37.9, adult: Secondary | ICD-10-CM

## 2018-05-23 DIAGNOSIS — Z13220 Encounter for screening for lipoid disorders: Secondary | ICD-10-CM

## 2018-05-23 DIAGNOSIS — Z6838 Body mass index (BMI) 38.0-38.9, adult: Secondary | ICD-10-CM

## 2018-05-23 DIAGNOSIS — R635 Abnormal weight gain: Secondary | ICD-10-CM | POA: Diagnosis not present

## 2018-05-23 DIAGNOSIS — O99345 Other mental disorders complicating the puerperium: Secondary | ICD-10-CM

## 2018-05-23 DIAGNOSIS — J302 Other seasonal allergic rhinitis: Secondary | ICD-10-CM | POA: Diagnosis not present

## 2018-05-23 DIAGNOSIS — F53 Postpartum depression: Secondary | ICD-10-CM | POA: Diagnosis not present

## 2018-05-23 MED ORDER — FLUTICASONE PROPIONATE 50 MCG/ACT NA SUSP: 1.0000 | Freq: Every day | NASAL | 2 refills | Status: DC

## 2018-05-23 NOTE — Patient Instructions (Signed)
Thank you for choosing Primary Care at [facility] for your medical home!    Lauren Leonard was seen by Molli Barrows, FNP today.   [place] primary care doctor is Scot Jun, FNP.   For the best care possible,  you should try to see Molli Barrows, FNP-C  whenever you come to clinic.   We look forward to seeing you again soon!  If you have any questions about your visit today,  please call us at [phone]  Or feel free to reach your provider via [facility].      Exercising to Lose Weight Exercise is structured, repetitive physical activity to improve fitness and health. Getting regular exercise is important for everyone. It is especially important if you are overweight. Being overweight increases your risk of heart disease, stroke, diabetes, high blood pressure, and several types of cancer. Reducing your calorie intake and exercising can help you lose weight. Exercise is usually categorized as moderate or vigorous intensity. To lose weight, most people need to do a certain amount of moderate-intensity or vigorous-intensity exercise each week. Moderate-intensity exercise  Moderate-intensity exercise is any activity that gets you moving enough to burn at least three times more energy (calories) than if you were sitting. Examples of moderate exercise include:  Walking a mile in 15 minutes.  Doing light yard work.  Biking at an easy pace. Most people should get at least 150 minutes (2 hours and 30 minutes) a week of moderate-intensity exercise to maintain their body weight. Vigorous-intensity exercise Vigorous-intensity exercise is any activity that gets you moving enough to burn at least six times more calories than if you were sitting. When you exercise at this intensity, you should be working hard enough that you are not able to carry on a conversation. Examples of vigorous exercise include:  Running.  Playing a team sport, such as football, basketball, and  soccer.  Jumping rope. Most people should get at least 75 minutes (1 hour and 15 minutes) a week of vigorous-intensity exercise to maintain their body weight. How can exercise affect me? When you exercise enough to burn more calories than you eat, you lose weight. Exercise also reduces body fat and builds muscle. The more muscle you have, the more calories you burn. Exercise also:  Improves mood.  Reduces stress and tension.  Improves your overall fitness, flexibility, and endurance.  Increases bone strength. The amount of exercise you need to lose weight depends on:  Your age.  The type of exercise.  Any health conditions you have.  Your overall physical ability. Talk to your health care provider about how much exercise you need and what types of activities are safe for you. What actions can I take to lose weight? Nutrition   Make changes to your diet as told by your health care provider or diet and nutrition specialist (dietitian). This may include: ? Eating fewer calories. ? Eating more protein. ? Eating less unhealthy fats. ? Eating a diet that includes fresh fruits and vegetables, whole grains, low-fat dairy products, and lean protein. ? Avoiding foods with added fat, salt, and sugar.  Drink plenty of water while you exercise to prevent dehydration or heat stroke. Activity  Choose an activity that you enjoy and set realistic goals. Your health care provider can help you make an exercise plan that works for you.  Exercise at a moderate or vigorous intensity most days of the week. ? The intensity of exercise may vary from person to person. You can  tell how intense a workout is for you by paying attention to your breathing and heartbeat. Most people will notice their breathing and heartbeat get faster with more intense exercise.  Do resistance training twice each week, such as: ? Push-ups. ? Sit-ups. ? Lifting weights. ? Using resistance bands.  Getting short  amounts of exercise can be just as helpful as long structured periods of exercise. If you have trouble finding time to exercise, try to include exercise in your daily routine. ? Get up, stretch, and walk around every 30 minutes throughout the day. ? Go for a walk during your lunch break. ? Park your car farther away from your destination. ? If you take public transportation, get off one stop early and walk the rest of the way. ? Make phone calls while standing up and walking around. ? Take the stairs instead of elevators or escalators.  Wear comfortable clothes and shoes with good support.  Do not exercise so much that you hurt yourself, feel dizzy, or get very short of breath. Where to find more information  U.S. Department of Health and Human Services: [employer]at  Centers for Disease Control and Prevention (CDC): http://www.wolf.info/ Contact a health care provider:  Before starting a new exercise program.  If you have questions or concerns about your weight.  If you have a medical problem that keeps you from exercising. Get help right away if you have any of the following while exercising:  Injury.  Dizziness.  Difficulty breathing or shortness of breath that does not go away when you stop exercising.  Chest pain.  Rapid heartbeat. Summary  Being overweight increases your risk of heart disease, stroke, diabetes, high blood pressure, and several types of cancer.  Losing weight happens when you burn more calories than you eat.  Reducing the amount of calories you eat in addition to getting regular moderate or vigorous exercise each week helps you lose weight. This information is not intended to replace advice given to you by your health care provider. Make sure you discuss any questions you have with your health care provider. Document Released: 04/08/2010 Document Revised: 03/19/2017 Document Reviewed: 03/19/2017 Elsevier Interactive Patient Education  2019 [employer].

## 2018-05-23 NOTE — Progress Notes (Signed)
Lauren Leonard, is a 28 y.o. female  RWE:[id number]  YPP:[id number]  DOB - [birth year]  CC:  Chief Complaint  Patient presents with  . Establish Care       HPI: Lauren Leonard is a 28 y.o. female is here today to establish care and evaluation of weight gain.  Lauren Leonard has Headache(784.0); Asthma; Allergic rhinitis; Abnormal Pap smear of cervix; and Abnormal Pap smear on their problem list.   Patient with a Body mass index is 37.36 kg/m.presents today concern regarding inability to lose weight. Patient delivered a baby almost 1 year ago and notes weight gain since that time. She has taken multiple diet supplement, hx liposuction 2017, and has trial and failed weight loss with several weight loss and weight management clinics. She endorses her goal weight loss is to drop 40 lbs. She admits that all women in her family( maternal side) are overweight and or obese. She endorses self preparing meals, reducing calorie intake, and exercise at least 3-4 days per week for an one hour each interval. She feels poorly about herself and depressed about her inability to loose weight.   Other chronic conditions include postpartum depression and history of anxiety. Patient was prescribed Zoloft initially for symptoms but had increased anxiety and felt there her skin was crawling while taking medications. She then switched to Lexpro and never filled prescription. Depression screen and GAD 7 are both positive for anxiety and depression. She reports establishing with a counselor over one month ago.  She has been through counseling in the past and felt that it was not very beneficial but decided to try again as she desired not to use medication at present.  She reports she is been treated in the past for mood, anger, depression and anxiety. Denies suicidal ideations, homicidal ideations, or auditory hallucinations. Suffers from chronic allergies.  Reports she currently takes over-the-counter Allegra-D and uses Flonase  which is been beneficial.  No previous allergy testing or evaluation.  Allergies symptoms are present year round. Takes antihistamines as needed and not daily. Attempting to covert to holistic treatment for allergies and get away from traditional medicine.   Current medications: Current Outpatient Medications:  .  fluticasone (FLONASE) 50 MCG/ACT nasal spray, Place 1-2 sprays into both nostrils daily., Disp: 1 g, Rfl: 2   Pertinent family medical history: family history includes Asthma in her mother; Breast cancer in her maternal grandmother; Cerebral aneurysm in an other family member; Diabetes in her maternal grandfather and maternal grandmother; Gout in her father; Hypertension in her maternal grandmother and mother; Migraines in her mother; Pancreatic cancer in her maternal grandfather; Seizures in her mother; Thyroid disease in her mother.   Allergies  Allergen Reactions  . Latex Other (See Comments)    Itches, breaks my hands out, burning  . Zoloft [Sertraline]     Makes anxious  . Citric Acid Rash    Breakouts on chest and back.    Social History   Socioeconomic History  . Marital status: Single    Spouse name: Not on file  . Number of children: Not on file  . Years of education: Not on file  . Highest education level: Not on file  Occupational History  . 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 Financial resource strain: Not on file  . Food insecurity:    Worry: Not on file    Inability: Not on file  . Transportation needs:    Medical: Not on file    Non-medical: Not  on file  Tobacco Use  . Smoking status: Never Smoker  . Smokeless tobacco: Never Used  . Tobacco comment: tried as teenager- y.o   Substance and Sexual Activity  . Alcohol use: Yes    Comment: rarely since nursing baby  . Drug use: No  . Sexual activity: Yes    Birth control/protection: Condom  Lifestyle  . Physical activity:    Days per week: 3 days    Minutes per session: 60 min  . Stress:  Rather much  Relationships  . Social connections:    Talks on phone: More than three times a week    Gets together: More than three times a week    Attends religious service: More than 4 times per year    Active member of club or organization: No    Attends meetings of clubs or organizations: Never    Relationship status: Never married  . Intimate partner violence:    Fear of current or ex partner: Not on file    Emotionally abused: Not on file    Physically abused: Not on file    Forced sexual activity: Not on file  Other Topics Concern  . Not on file  Social History Narrative   No children   Patient writes with both hands, primarily uses left   Patient is in grad school   Drinks a cup of coffee a day     Review of Systems: Pertinent negatives listed in HPI Objective:   Vitals:   05/23/18 1354  BP: 103/67  Pulse: 75  Resp: 16  Temp: 97.7 F (36.5 C)  SpO2: 97%    BP Readings from Last 3 Encounters:  05/23/18 103/67  04/10/18 114/76  03/07/18 119/75    Filed Weights   05/23/18 1354  Weight: 228 lb (103.4 kg)      Physical Exam: Constitutional: Patient appears well-developed and well-nourished. No distress. HENT: Normocephalic, atraumatic, External right and left ear normal.  Eyes: Conjunctivae and EOM are normal. PERRLA, no scleral icterus. Neck: Normal ROM. Neck supple. No JVD. No tracheal deviation. No thyromegaly. CVS: RRR, S1/S2 +, no murmurs, no gallops, no carotid bruit.  Pulmonary: Effort and breath sounds normal, no stridor, rhonchi, wheezes, rales.  Abdominal: Soft. BS +, no distension, tenderness, rebound or guarding.  Musculoskeletal: Normal range of motion. No edema and no tenderness.  Neuro: Alert. Normal muscle tone coordination. Normal gait.  Skin: Skin is warm and dry. No rash noted. Not diaphoretic. No erythema. No pallor. Psychiatric: Normal mood and affect. Behavior, judgment, thought content normal.  Lab Results (prior encounters)  Lab  Results  Component Value Date   WBC 12.5 (H) 05/12/2017   HGB 12.3 05/12/2017   HCT 35.7 (L) 05/12/2017   MCV 90.4 05/12/2017   PLT 206 05/12/2017   Lab Results  Component Value Date   CREATININE 0.65 04/24/2017   BUN 6 04/24/2017   NA 132 (L) 04/24/2017   K 3.3 (L) 04/24/2017   CL 106 04/24/2017   CO2 17 (L) 04/24/2017    No results found for: HGBA1C  No results found for: CHOL, TRIG, HDL, CHOLHDL, VLDL, LDLCALC      Assessment and plan:  1. BMI 38.0-38.9,adult,  - Amb Ref to Medical Weight Management  2. Unintended weight gain Checking labs to rule out metabolic cause of weight gain  - Hemoglobin A1c - Comprehensive metabolic panel - CBC with Differential - Thyroid Panel With TSH  3. Postpartum depression -Recommended and offered follow-up here  in office with Licensed Counselor. Patient will consider Positive depression and anxiety screen Without psychotic feature.  4. Seasonal allergies -Continue Flonase   5. Screen lipids  -lipid panel pending. Patient fasting    Return for as needed for routine health maintance .   The patient was given clear instructions to go to ER or return to medical center if symptoms don't improve, worsen or new problems develop. The patient verbalized understanding. The patient was advised  to call and obtain lab results if they haven't heard anything from out office within 7-10 business days.  Molli Barrows, FNP Primary Care at [facility] [address], [place] [address] [phone]: [phone]    This note has been created with Dragon speech recognition software and Engineer, materials. Any transcriptional errors are unintentional.

## 2018-05-23 NOTE — Progress Notes (Signed)
Last pap - 2018  Discuss weight loss, " Uncomfortable in my own skin"  Dry cough

## 2018-05-24 LAB — THYROID PANEL WITH TSH
FREE THYROXINE INDEX: 2 (ref 1.2–4.9)
T3 UPTAKE RATIO: 24 % (ref 24–39)
T4, Total: 8.3 ug/dL (ref 4.5–12.0)
TSH: 0.663 u[IU]/mL (ref 0.450–4.500)

## 2018-05-24 LAB — COMPREHENSIVE METABOLIC PANEL
A/G RATIO: 1.7 (ref 1.2–2.2)
ALT: 22 IU/L (ref 0–32)
AST: 21 IU/L (ref 0–40)
Albumin: 4.7 g/dL (ref 3.9–5.0)
Alkaline Phosphatase: 96 IU/L (ref 39–117)
BUN/Creatinine Ratio: 12 (ref 9–23)
BUN: 10 mg/dL (ref 6–20)
Bilirubin Total: 0.7 mg/dL (ref 0.0–1.2)
CO2: 25 mmol/L (ref 20–29)
CREATININE: 0.85 mg/dL (ref 0.57–1.00)
Calcium: 9.6 mg/dL (ref 8.7–10.2)
Chloride: 100 mmol/L (ref 96–106)
GFR calc non Af Amer: 94 mL/min/{1.73_m2} (ref >59)
GFR, EST AFRICAN AMERICAN: 108 mL/min/{1.73_m2} (ref >59)
Globulin, Total: 2.8 g/dL (ref 1.5–4.5)
Glucose: 75 mg/dL (ref 65–99)
POTASSIUM: 4.3 mmol/L (ref 3.5–5.2)
Sodium: 140 mmol/L (ref 134–144)
TOTAL PROTEIN: 7.5 g/dL (ref 6.0–8.5)

## 2018-05-24 LAB — LIPID PANEL
CHOLESTEROL TOTAL: 194 mg/dL (ref 100–199)
Chol/HDL Ratio: 3.5 ratio (ref 0.0–4.4)
HDL: 56 mg/dL (ref >39)
LDL Calculated: 121 mg/dL — ABNORMAL HIGH (ref 0–99)
TRIGLYCERIDES: 84 mg/dL (ref 0–149)
VLDL CHOLESTEROL CAL: 17 mg/dL (ref 5–40)

## 2018-05-24 LAB — CBC WITH DIFFERENTIAL/PLATELET
BASOS: 1 %
Basophils Absolute: 0 10*3/uL (ref 0.0–0.2)
EOS (ABSOLUTE): 0.1 10*3/uL (ref 0.0–0.4)
Eos: 2 %
HEMATOCRIT: 41.1 % (ref 34.0–46.6)
HEMOGLOBIN: 14.5 g/dL (ref 11.1–15.9)
IMMATURE GRANS (ABS): 0 10*3/uL (ref 0.0–0.1)
IMMATURE GRANULOCYTES: 0 %
LYMPHS: 30 %
Lymphocytes Absolute: 1.7 10*3/uL (ref 0.7–3.1)
MCH: 31.5 pg (ref 26.6–33.0)
MCHC: 35.3 g/dL (ref 31.5–35.7)
MCV: 89 fL (ref 79–97)
MONOCYTES: 8 %
Monocytes Absolute: 0.4 10*3/uL (ref 0.1–0.9)
NEUTROS ABS: 3.4 10*3/uL (ref 1.4–7.0)
NEUTROS PCT: 59 %
Platelets: 343 10*3/uL (ref 150–450)
RBC: 4.6 x10E6/uL (ref 3.77–5.28)
RDW: 12.1 % (ref 11.7–15.4)
WBC: 5.7 10*3/uL (ref 3.4–10.8)

## 2018-05-24 LAB — HEMOGLOBIN A1C
ESTIMATED AVERAGE GLUCOSE: 108 mg/dL
Hgb A1c MFr Bld: 5.4 % (ref 4.8–5.6)

## 2018-06-12 ENCOUNTER — Encounter (INDEPENDENT_AMBULATORY_CARE_PROVIDER_SITE_OTHER): Payer: Self-pay

## 2018-06-17 ENCOUNTER — Ambulatory Visit (INDEPENDENT_AMBULATORY_CARE_PROVIDER_SITE_OTHER): Payer: Self-pay | Admitting: Family Medicine

## 2018-07-01 ENCOUNTER — Ambulatory Visit (INDEPENDENT_AMBULATORY_CARE_PROVIDER_SITE_OTHER): Payer: Self-pay | Admitting: Family Medicine

## 2018-07-03 ENCOUNTER — Encounter: Payer: Self-pay | Admitting: Family Medicine

## 2018-07-25 ENCOUNTER — Institutional Professional Consult (permissible substitution): Payer: BC Managed Care – PPO | Admitting: Plastic Surgery

## 2018-08-05 ENCOUNTER — Encounter: Payer: Self-pay | Admitting: Family Medicine

## 2018-08-15 ENCOUNTER — Encounter: Payer: Self-pay | Admitting: Family Medicine

## 2018-08-15 MED ORDER — FLUTICASONE PROPIONATE 50 MCG/ACT NA SUSP: 1.0000 | Freq: Every day | NASAL | 2 refills | Status: DC

## 2018-08-20 ENCOUNTER — Ambulatory Visit: Payer: BC Managed Care – PPO | Admitting: Plastic Surgery

## 2018-08-20 ENCOUNTER — Encounter: Payer: Self-pay | Admitting: Plastic Surgery

## 2018-08-20 ENCOUNTER — Other Ambulatory Visit: Payer: Self-pay

## 2018-08-20 DIAGNOSIS — E881 Lipodystrophy, not elsewhere classified: Secondary | ICD-10-CM | POA: Diagnosis not present

## 2018-08-20 NOTE — Progress Notes (Signed)
Patient ID: Lauren Leonard, female    DOB: 1991/02/17, 28 y.o.   MRN: [id number]   Chief Complaint  Patient presents with  . Advice Only    The patient is a 28 year old female here for evaluation of her abdomen and flanks.  The patient has a 49-year-old baby boy and is still breast-feeding.  She has no plans of having more kids.  She states that her husband is planning on having a vasectomy.  She is 5 feet 6 inches tall and weighs 273 pounds.  She is interested in reduction of the weight around her abdominal area and flanks.  She has had liposuction in the past.  This may explain the bulge of fat she has at the superior middle portion of her abdomen.  I do not feel a hernia.  She does have mild diastases but this could still be early and she is breast-feeding.  She is otherwise healthy.  She does not have a hanging pannus and seems to be fairly thick.  I do not see any signs of infection or skin breakdown.  The patient states she has tried every means of weight loss that she is aware of both on her own and professionally.   Review of Systems  Constitutional: Negative.  Negative for activity change and appetite change.  HENT: Negative.   Eyes: Negative.   Respiratory: Negative.  Negative for chest tightness and shortness of breath.   Cardiovascular: Negative.  Negative for leg swelling.  Gastrointestinal: Negative.   Endocrine: Negative.   Genitourinary: Negative.   Musculoskeletal: Negative.   Skin: Negative for color change and wound.  Neurological: Negative.   Hematological: Negative.   Psychiatric/Behavioral: Negative.     Past Medical History:  Diagnosis Date  . Abnormal Pap smear of cervix   . Allergic rhinitis   . Brain cyst   . Headache    migraines  . IBS (irritable bowel syndrome)   . Mild intermittent asthma   . Ovarian cyst   . 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 CHOLECYSTECTOMY  06/28/12   lap chole      Current Outpatient Medications:  .  fluticasone (FLONASE) 50 MCG/ACT nasal spray, Place 1-2 sprays into both nostrils daily., Disp: 1 g, Rfl: 2   Objective:   Vitals:   08/20/18 1535  BP: 108/67  Pulse: 60  Temp: 98 F (36.7 C)  SpO2: 97%    Physical Exam Vitals signs and nursing note reviewed.  Constitutional:      Appearance: Normal appearance.  HENT:     Head: Normocephalic and atraumatic.     Mouth/Throat:     Mouth: Mucous membranes are moist.  Neck:     Musculoskeletal: Normal range of motion.  Cardiovascular:     Rate and Rhythm: Normal rate.     Pulses: Normal pulses.  Pulmonary:     Effort: Pulmonary effort is normal.  Abdominal:     General: Abdomen is flat. There is no distension.     Tenderness: There is no guarding or rebound.     Hernia: No hernia is present.    Skin:    General: Skin is warm.  Neurological:     General: No focal deficit present.     Mental Status: She is alert and oriented to person, place, and time.  Psychiatric:        Mood and Affect: Mood normal.  Behavior: Behavior normal.        Thought Content: Thought content normal.     Assessment & Plan:  Lipodystrophy  Pictures were taken and placed in the chart with the patient permission.  Recommend that she not have any surgery for at least 6 months after she has finished breast-feeding.  I think a consultation for general surgery is reasonable and she requested it for the possibility of gastric bypass. Quote given for surgery.  [place], DO

## 2018-08-25 ENCOUNTER — Other Ambulatory Visit: Payer: Self-pay | Admitting: Family Medicine

## 2018-08-27 ENCOUNTER — Ambulatory Visit (INDEPENDENT_AMBULATORY_CARE_PROVIDER_SITE_OTHER): Payer: BC Managed Care – PPO | Admitting: Family Medicine

## 2018-08-27 ENCOUNTER — Other Ambulatory Visit: Payer: Self-pay

## 2018-08-27 DIAGNOSIS — R0602 Shortness of breath: Secondary | ICD-10-CM | POA: Diagnosis not present

## 2018-08-27 DIAGNOSIS — J4520 Mild intermittent asthma, uncomplicated: Secondary | ICD-10-CM

## 2018-08-27 DIAGNOSIS — J302 Other seasonal allergic rhinitis: Secondary | ICD-10-CM | POA: Diagnosis not present

## 2018-08-27 DIAGNOSIS — M79605 Pain in left leg: Secondary | ICD-10-CM

## 2018-08-27 DIAGNOSIS — M7989 Other specified soft tissue disorders: Secondary | ICD-10-CM

## 2018-08-27 DIAGNOSIS — M79604 Pain in right leg: Secondary | ICD-10-CM

## 2018-08-27 MED ORDER — ALBUTEROL SULFATE HFA 108 (90 BASE) MCG/ACT IN AERS: 2.0000 | INHALATION_SPRAY | Freq: Four times a day (QID) | RESPIRATORY_TRACT | 1 refills | Status: DC | PRN

## 2018-08-27 NOTE — Progress Notes (Deleted)
Originally both Feet pain for about 2 wks and  this weekend left leg is stiff but not in pain  Per pt she felt like she could not breath on the left side of her throat and her ears are itching and irritated.

## 2018-08-27 NOTE — Progress Notes (Signed)
Virtual Visit via Telephone Note  I connected with Lauren Leonard on 08/27/18 at  9:50 AM EDT by telephone and verified that I am speaking with the correct person using two identifiers.  Location: Patient: Located at home during today's encounter  Provider: Located at primary care office     I discussed the limitations, risks, security and privacy concerns of performing an evaluation and management service by telephone and the availability of in person appointments. I also discussed with the patient that there may be a patient responsible charge related to this service. The patient expressed understanding and agreed to proceed.  Lauren Leonard has Headache(784.0); Asthma; Allergic rhinitis; GERD,Abnormal Pap smear of cervix; Abnormal Pap smear; and Lipodystrophy on their problem list.  History of Present Illness: Lauren Leonard reports multiple symptoms occurring over the last 2 weeks. Experiencing sharp throat pain. Pain only occurred on the left side of throat and caused difficulty breathing. These symptoms persisted for 2 weeks and only resolved yesterday. She feared symptoms were related to allergies and started benadryl and used albuterol inhaler. She denies wheezing, fever, pain with swallowing or an inability to eat or drink. She endorses PND and nasal congestion while symptoms were present.  She also reports 4 days experiencing bilateral foot swelling and pain and left leg stiffness which has now moved to the right leg. She has not taken any medication such as ibuprofen, tylenol, or naproxen. Reports she has been too busy. Foot swelling has resolved as of today. Denies any injury. Assessment and Plan: 1. Mild intermittent chronic asthma without complication Refilled albuterol inhaler. Referral placed to asthma and allergy specialist for asthma management. - Ambulatory referral to Allergy  2. SOB (shortness of breath) Continue Albuterol inhale every 4-6 hours as needed for shortness of  breath. - Ambulatory referral to Allergy  3. Seasonal allergies -Recommended consistently taking cetirizine and or levocetirizine once daily consistently opposed to as needed to maximize effectiveness of antihistamine action.  Continue Flonase. - Ambulatory referral to Allergy  4. Leg Pain, Bilateral  -Recommended trialing naproxen over-the-counter 225 mg x 2 twice daily as needed for pain.  Recommended warm soaks, exercise, compression socks, and leg elevation. 5. Bilateral swelling of feet  -Recommended compression socks and elevating feet.  Also recommended monitoring sodium intake by reading food labels as this can cause fluid retention.  Follow Up Instructions: PRN and schedule GYN follow-up visit    I discussed the assessment and treatment plan with the patient. The patient was provided an opportunity to ask questions and all were answered. The patient agreed with the plan and demonstrated an understanding of the instructions.   The patient was advised to call back or seek an in-person evaluation if the symptoms worsen or if the condition fails to improve as anticipated.  I provided 20 minutes of non-face-to-face time during this encounter.   Molli Barrows, FNP

## 2018-10-01 ENCOUNTER — Telehealth: Payer: Self-pay | Admitting: Family Medicine

## 2018-10-01 DIAGNOSIS — Z20822 Contact with and (suspected) exposure to covid-19: Secondary | ICD-10-CM

## 2018-10-01 NOTE — Telephone Encounter (Signed)
patient called with sore throt and ear pain and suff noses wants to be test for covid

## 2018-10-01 NOTE — Telephone Encounter (Signed)
Notify patient COVID-19 testing ordered. She can drive to the [facility] Location between 8:00 and before 3:45 pm for drive up testing. No appointment required. Positive results will be notified via phone and negative results communicated via My Chart.

## 2018-10-02 NOTE — Telephone Encounter (Signed)
Called the patient and informed her that the test was avilable at the [place] location patient stated she could go today and would go tomorrow

## 2018-10-09 ENCOUNTER — Encounter: Payer: Self-pay | Admitting: Allergy

## 2018-10-09 ENCOUNTER — Other Ambulatory Visit: Payer: Self-pay

## 2018-10-09 ENCOUNTER — Ambulatory Visit: Payer: BC Managed Care – PPO | Admitting: Allergy

## 2018-10-09 ENCOUNTER — Telehealth: Payer: Self-pay | Admitting: Family Medicine

## 2018-10-09 VITALS — BP 100/78 | HR 80 | Temp 97.3°F | Resp 16 | Ht 66.0 in | Wt 243.8 lb

## 2018-10-09 DIAGNOSIS — J4520 Mild intermittent asthma, uncomplicated: Secondary | ICD-10-CM | POA: Diagnosis not present

## 2018-10-09 DIAGNOSIS — J310 Chronic rhinitis: Secondary | ICD-10-CM

## 2018-10-09 DIAGNOSIS — R21 Rash and other nonspecific skin eruption: Secondary | ICD-10-CM | POA: Diagnosis not present

## 2018-10-09 MED ORDER — TRIAMCINOLONE ACETONIDE 0.1 % EX CREA: 1.0000 "application " | TOPICAL_CREAM | Freq: Two times a day (BID) | CUTANEOUS | 1 refills | Status: DC | PRN

## 2018-10-09 MED ORDER — TRIAMCINOLONE ACETONIDE 0.1 % EX CREA: 1.0000 "application " | TOPICAL_CREAM | Freq: Two times a day (BID) | CUTANEOUS | 2 refills | Status: DC

## 2018-10-09 MED ORDER — FLUTICASONE PROPIONATE 50 MCG/ACT NA SUSP: 1.0000 | Freq: Every day | NASAL | 5 refills | Status: DC

## 2018-10-09 NOTE — Progress Notes (Signed)
New Patient Note  RE: Lauren Leonard MRN: [id number] DOB: Jul 22, 1990 Date of Office Visit: 10/09/2018  Referring provider: Scot Jun, FNP Primary care provider: Scot Jun, FNP  Chief Complaint: Asthma (coughing and wheezing, has been gasping for air when outside because of the hot weather), Rash (arms and legs after being waxed), Urticaria (after being waxed), Allergic Rhinitis  (stuffy and congested nose especially at nighta), and Allergy Testing  History of Present Illness: I had the pleasure of seeing Lauren Leonard for initial evaluation at the [facility] on 10/10/2018. She is a 28 y.o. female, who is referred here by Scot Jun, FNP for the evaluation of asthma and allergies.   Asthma: She reports symptoms of chest tightness, shortness of breath, coughing, wheezing for 10+ years. Current medications include albuterol prn which help. She reports not using aerochamber with asthma inhalers. She tried the following inhalers: Advair Diskus. Main asthma triggers are hot weather, smoke. In the last month, frequency of asthma symptoms: few times/week. Frequency of nocturnal symptoms: 0x/month. Frequency of SABA use: few times/week. Interference with physical activity: no. Sleep is undisturbed. In the last 12 months, emergency room visits/urgent care visits/doctor office visits or hospitalizations due to asthma: 3. In the last 12 months, oral steroids courses: 1. Lifetime history of hospitalization for asthma: no. Prior intubations: 0. Asthma was diagnosed at age middle school. History of pneumonia: once maybe. She was evaluated by allergist in the past. Smoking exposure: husband vapes. Up to date with flu vaccine:  History of reflux: yes.  Rhinitis: She reports symptoms of nasal congestion, sneezing. Symptoms have been going on for few years. Anosmia: no. Headache: yes.  She has used benadryl prn, allegra, zyrtec, zicam and Flonase with some improvement in  symptoms. Sinus infections: yes. Previous work up includes: as a child and positive to grass, trees, pet dander, dust per patient report.  Previous ENT evaluation: yes in 2016 for ear pain.  Assessment and Plan: Lauren Leonard is a 28 y.o. female with: Mild intermittent asthma without complication Diagnosed with asthma over 10+ years ago. Using albuterol prn a few a times a week with good benefit. Main triggers are hot weather and smoke. Used to be on Advair Diskus as a child.   Today's spirometry was normal. . Daily controller medication(s): None . Prior to physical activity: May use albuterol rescue inhaler 2 puffs 5 to 15 minutes prior to strenuous physical activities. Marland Kitchen Rescue medications: May use albuterol rescue inhaler 2 puffs or nebulizer every 4 to 6 hours as needed for shortness of breath, chest tightness, coughing, and wheezing. Monitor frequency of use.  Chronic rhinitis Perennial rhinitis symptoms for the past few years. Tried OTC antihistamines, Flonase and Zicam with some benefit. Skin testing as child was positive to grass, trees, pet dander and dust per patient report.  Unable to skin test today due to recent antihistamine intake.  Patient prefers getting bloodwork today and will draw in office.  Will make additional recommendations based on results.  May use over the counter antihistamines such as Zyrtec (cetirizine), Claritin (loratadine), Allegra (fexofenadine), or Xyzal (levocetirizine) daily as needed. May take it twice a day for breakthrough symptoms as needed.   Start Flonase 1-2 sprays per nostril daily. This replaces zicam.  Nasal saline spray (i.e., Simply Saline) or nasal saline lavage (i.e., NeilMed) is recommended as needed and prior to medicated nasal sprays.  Rash and nonspecific skin eruption Rash on extremities after underwent waxing.  She  had other types of waxing with no issues in the past.   Avoid this particular type of wax in the future.  Start  triamcinolone cream 0.1% twice a day for rash flare. Do not use on the face and do not use for more than 3 weeks at a time.  Start proper skin care measures.   If rash reoccurs take pictures.   Return in about 2 months (around 12/10/2018).  Meds ordered this encounter  Medications  . fluticasone (FLONASE) 50 MCG/ACT nasal spray    Sig: Place 1-2 sprays into both nostrils daily.    Dispense:  16 g    Refill:  5  . DISCONTD: triamcinolone cream (KENALOG) 0.1 %    Sig: Apply 1 application topically 2 (two) times daily. Use it twice a day as needed for the rash. Do not use more than 3 weeks at a time. Do not use on the face.    Dispense:  30 g    Refill:  2  . triamcinolone cream (KENALOG) 0.1 %    Sig: Apply 1 application topically 2 (two) times daily as needed. For the rash. Do not use more than 3 weeks at a time. Do not use on the face.    Dispense:  80 g    Refill:  1    Lab Orders     Allergens w/Total IgE Area 2  Other allergy screening: Food allergy: yes  Citrus acid - rash  Medication allergy: yes  Zoloft - anxiety Latex - rash Hymenoptera allergy: no Urticaria: yes - broke out in hives/rash right after she had waxing about 1 week ago. Some pruritis with this. Using OTC hydrocortisone cream with some benefit.  Eczema: yes History of recurrent infections suggestive of immunodeficency: no  Diagnostics: Spirometry:  Tracings reviewed. Her effort: Good reproducible efforts. FVC: 3.74L FEV1: 3.05L, 103% predicted FEV1/FVC ratio: 82% Interpretation: Spirometry consistent with normal pattern.  Please see scanned spirometry results for details.  Skin Testing: Deferred due to recent antihistamines use.  Past Medical History: Patient Active Problem List   Diagnosis Date Noted  . Chronic rhinitis 10/09/2018  . Mild intermittent asthma without complication 03/00/9233  . Rash and nonspecific skin eruption 10/09/2018  . 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 Abnormal Pap smear  09/26/2017  . Asthma 08/28/2014  . Abnormal Pap smear of cervix 08/28/2014  . Headache(784.0) 08/29/2013  . Allergic rhinitis 05/09/2011   Past Medical History:  Diagnosis Date  . Abnormal Pap smear of cervix   . Allergic rhinitis   . Brain cyst   . Headache    migraines  . IBS (irritable bowel syndrome)   . Mild intermittent asthma   . Ovarian cyst   . 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 CHOLECYSTECTOMY  06/28/12   lap chole   Medication List:  Current Outpatient Medications  Medication Sig Dispense Refill  . albuterol (VENTOLIN HFA) 108 (90 Base) MCG/ACT inhaler Inhale 2 puffs into the lungs every 6 (six) hours as needed for wheezing or shortness of breath. 1 Inhaler 1  . diphenhydrAMINE (BENADRYL) 25 MG tablet Take 25 mg by mouth every 6 (six) hours as needed.    . fluticasone (FLONASE) 50 MCG/ACT nasal spray Place 1-2 sprays into both nostrils daily. 16 g 5  . Homeopathic Products (ZICAM ALLERGY RELIEF) GEL Place into the nose.    . triamcinolone cream (KENALOG) 0.1 % Apply 1 application topically  2 (two) times daily as needed. For the rash. Do not use more than 3 weeks at a time. Do not use on the face. 80 g 1   No current facility-administered medications for this visit.    Allergies: Allergies  Allergen Reactions  . Latex Other (See Comments)    Itches, breaks my hands out, burning  . Zoloft [Sertraline]     Makes anxious  . Citric Acid Rash    Breakouts on chest and back.   Social History: Social History   Socioeconomic History  . Marital status: Single    Spouse name: Not on file  . Number of children: Not on file  . Years of education: Not on file  . Highest education level: Not on file  Occupational History  . 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 Financial resource strain: Not on file  . Food insecurity    Worry: Not on file    Inability: Not on file  .  Transportation needs    Medical: Not on file    Non-medical: Not on file  Tobacco Use  . Smoking status: Never Smoker  . Smokeless tobacco: Never Used  . Tobacco comment: husband vapes  Substance and Sexual Activity  . Alcohol use: Yes    Comment: rarely since nursing baby  . Drug use: No  . Sexual activity: Yes    Birth control/protection: Condom  Lifestyle  . Physical activity    Days per week: 3 days    Minutes per session: 60 min  . Stress: Rather much  Relationships  . Social connections    Talks on phone: More than three times a week    Gets together: More than three times a week    Attends religious service: More than 4 times per year    Active member of club or organization: No    Attends meetings of clubs or organizations: Never    Relationship status: Never married  Other Topics Concern  . Not on file  Social History Narrative   No children   Patient writes with both hands, primarily uses left   Patient is in grad school   Drinks a cup of coffee a day    Lives in a house which is about 29 year old. Smoking: husband vapes Occupation: Press photographer History: Water Damage/mildew in the house: yes some mildew around the tub. Carpet in the family room: yes Carpet in the bedroom: yes Heating: electric Cooling: central Pet: no  Family History: Family History  Problem Relation Age of Onset  . Diabetes Maternal Grandmother   . Hypertension Maternal Grandmother   . Breast cancer Maternal Grandmother   . Diabetes Maternal Grandfather   . Pancreatic cancer Maternal Grandfather   . Migraines Mother   . Hypertension Mother   . Asthma Mother   . Seizures Mother   . Thyroid disease Mother   . Gout Father   . Cerebral aneurysm Other    Review of Systems  Constitutional: Negative for appetite change, chills, fever and unexpected weight change.  HENT: Positive for congestion. Negative for rhinorrhea.   Eyes: Negative for itching.   Respiratory: Positive for cough and wheezing. Negative for chest tightness and shortness of breath.   Cardiovascular: Negative for chest pain.  Gastrointestinal: Negative for abdominal pain.  Genitourinary: Negative for difficulty urinating.  Skin: Positive for rash.  Allergic/Immunologic: Negative for environmental allergies and food allergies.  Neurological: Negative for headaches.   Objective: BP 100/78 (  BP Location: Left Arm, Patient Position: Sitting, Cuff Size: Large)   Pulse 80   Temp (!) 97.3 F (36.3 C) (Temporal)   Resp 16   Ht 5\' 6"  (1.676 m)   Wt 243 lb 12.8 oz (110.6 kg)   SpO2 98%   BMI 39.35 kg/m  Body mass index is 39.35 kg/m. Physical Exam  Constitutional: She is oriented to person, place, and time. She appears well-developed and well-nourished.  HENT:  Head: Normocephalic and atraumatic.  Right Ear: External ear normal.  Left Ear: External ear normal.  Nose: Nose normal.  Mouth/Throat: Oropharynx is clear and moist.  Eyes: Conjunctivae and EOM are normal.  Neck: Neck supple.  Cardiovascular: Normal rate, regular rhythm and normal heart sounds. Exam reveals no gallop and no friction rub.  No murmur heard. Pulmonary/Chest: Effort normal and breath sounds normal. She has no wheezes. She has no rales.  Abdominal: Soft.  Neurological: She is alert and oriented to person, place, and time.  Skin: Skin is warm. Rash noted.  Diffuse erythematous papular rash on lower extremities and upper extremities b/l.  Psychiatric: She has a normal mood and affect. Her behavior is normal.  Nursing note and vitals reviewed.  The plan was reviewed with the patient/family, and all questions/concerned were addressed.  It was my pleasure to see Lauren Leonard today and participate in her care. Please feel free to contact me with any questions or concerns.  Sincerely,  Rexene Alberts, DO Allergy & Immunology  [facility] office: [phone]  [facility] office: [place] office: [phone]

## 2018-10-09 NOTE — Patient Instructions (Addendum)
Today's breathing test looked good.  Asthma: . Daily controller medication(s): None . Prior to physical activity: May use albuterol rescue inhaler 2 puffs 5 to 15 minutes prior to strenuous physical activities. Marland Kitchen Rescue medications: May use albuterol rescue inhaler 2 puffs or nebulizer every 4 to 6 hours as needed for shortness of breath, chest tightness, coughing, and wheezing. Monitor frequency of use.  . Asthma control goals:  o Full participation in all desired activities (may need albuterol before activity) o Albuterol use two times or less a week on average (not counting use with activity) o Cough interfering with sleep two times or less a month o Oral steroids no more than once a year o No hospitalizations  Rhinitis:  Get bloodwork and will make additional recommendations based on results.  May use over the counter antihistamines such as Zyrtec (cetirizine), Claritin (loratadine), Allegra (fexofenadine), or Xyzal (levocetirizine) daily as needed. May take it twice a day for breakthrough symptoms as needed.   Start Flonase 1-2 sprays per nostril daily. This replaces zicam.  Nasal saline spray (i.e., Simply Saline) or nasal saline lavage (i.e., NeilMed) is recommended as needed and prior to medicated nasal sprays.  Rash:  Start triamcinolone cream 0.1% twice a day for rash flare. Do not use on the face and do not use for more than 3 weeks at a time.  Start proper skin care measures.   Follow up in 2 months.   Skin care recommendations  Bath time: . Always use lukewarm water. AVOID very hot or cold water. Marland Kitchen Keep bathing time to 5-10 minutes. . Do NOT use bubble bath. . Use a mild soap and use just enough to wash the dirty areas. . Do NOT scrub skin vigorously.  . After bathing, pat dry your skin with a towel. Do NOT rub or scrub the skin.  Moisturizers and prescriptions:  . ALWAYS apply moisturizers immediately after bathing (within 3 minutes). This helps to lock-in  moisture. . Use the moisturizer several times a day over the whole body. Kermit Balo summer moisturizers include: Aveeno, CeraVe, Cetaphil. Kermit Balo winter moisturizers include: Aquaphor, Vaseline, Cerave, Cetaphil, Eucerin, Vanicream. . When using moisturizers along with medications, the moisturizer should be applied about one hour after applying the medication to prevent diluting effect of the medication or moisturize around where you applied the medications. When not using medications, the moisturizer can be continued twice daily as maintenance.  Laundry and clothing: . Avoid laundry products with added color or perfumes. . Use unscented hypo-allergenic laundry products such as Tide free, Cheer free & gentle, and All free and clear.  . If the skin still seems dry or sensitive, you can try double-rinsing the clothes. . Avoid tight or scratchy clothing such as wool. . Do not use fabric softeners or dyer sheets.

## 2018-10-09 NOTE — Telephone Encounter (Signed)
Patient wants belviq.

## 2018-10-09 NOTE — Telephone Encounter (Signed)
Patient was referred to [facility]. Is there a way to get her appointments rescheduled? It looks like they were cancelled due to COVID restrictions.  We don't prescribe weight loss medications at this office.

## 2018-10-10 ENCOUNTER — Ambulatory Visit: Payer: Self-pay

## 2018-10-10 ENCOUNTER — Encounter: Payer: Self-pay | Admitting: Allergy

## 2018-10-10 NOTE — Assessment & Plan Note (Signed)
Rash on extremities after underwent waxing.  She had other types of waxing with no issues in the past.   Avoid this particular type of wax in the future.  Start triamcinolone cream 0.1% twice a day for rash flare. Do not use on the face and do not use for more than 3 weeks at a time.  Start proper skin care measures.   If rash reoccurs take pictures.

## 2018-10-10 NOTE — Assessment & Plan Note (Signed)
Diagnosed with asthma over 10+ years ago. Using albuterol prn a few a times a week with good benefit. Main triggers are hot weather and smoke. Used to be on Advair Diskus as a child.   Today's spirometry was normal. . Daily controller medication(s): None . Prior to physical activity: May use albuterol rescue inhaler 2 puffs 5 to 15 minutes prior to strenuous physical activities. Marland Kitchen Rescue medications: May use albuterol rescue inhaler 2 puffs or nebulizer every 4 to 6 hours as needed for shortness of breath, chest tightness, coughing, and wheezing. Monitor frequency of use.

## 2018-10-10 NOTE — Assessment & Plan Note (Signed)
Perennial rhinitis symptoms for the past few years. Tried OTC antihistamines, Flonase and Zicam with some benefit. Skin testing as child was positive to grass, trees, pet dander and dust per patient report.  Unable to skin test today due to recent antihistamine intake.  Patient prefers getting bloodwork today and will draw in office.  Will make additional recommendations based on results.  May use over the counter antihistamines such as Zyrtec (cetirizine), Claritin (loratadine), Allegra (fexofenadine), or Xyzal (levocetirizine) daily as needed. May take it twice a day for breakthrough symptoms as needed.   Start Flonase 1-2 sprays per nostril daily. This replaces zicam.  Nasal saline spray (i.e., Simply Saline) or nasal saline lavage (i.e., NeilMed) is recommended as needed and prior to medicated nasal sprays.

## 2018-10-12 LAB — ALLERGENS W/TOTAL IGE AREA 2

## 2018-10-14 ENCOUNTER — Encounter: Payer: Self-pay | Admitting: Allergy

## 2018-10-23 ENCOUNTER — Other Ambulatory Visit (INDEPENDENT_AMBULATORY_CARE_PROVIDER_SITE_OTHER): Payer: Self-pay | Admitting: Primary Care

## 2018-10-23 ENCOUNTER — Other Ambulatory Visit: Payer: Self-pay | Admitting: Family Medicine

## 2018-10-23 ENCOUNTER — Other Ambulatory Visit: Payer: Self-pay

## 2018-10-23 DIAGNOSIS — Z20822 Contact with and (suspected) exposure to covid-19: Secondary | ICD-10-CM

## 2018-10-23 MED ORDER — ALBUTEROL SULFATE HFA 108 (90 BASE) MCG/ACT IN AERS: 2.0000 | INHALATION_SPRAY | Freq: Four times a day (QID) | RESPIRATORY_TRACT | 1 refills | Status: DC | PRN

## 2018-10-23 NOTE — Telephone Encounter (Signed)
Patient needs to be seen at [facility] for TSH/T4 follow up in 8 weeks to determine if levoxyl is theraputic

## 2018-10-23 NOTE — Telephone Encounter (Signed)
Request is not for levoxyl. Request is for albuterol refill. Nat Christen, CMA

## 2018-10-25 LAB — NOVEL CORONAVIRUS, NAA: SARS-CoV-2, NAA: NOT DETECTED

## 2018-11-06 ENCOUNTER — Telehealth: Payer: Self-pay | Admitting: *Deleted

## 2018-11-06 NOTE — Telephone Encounter (Signed)
Patient wants to speak with a nurse regarding her allergy test results. Patient did not understand the results and would like someone to go over everything so she knows what to avoid. Patient recently started breaking out on her face and just wanted [place] to know.

## 2018-11-06 NOTE — Telephone Encounter (Signed)
Spoke with patient and advise taking otc antihistamine as noted in her chart. Per Dr. Maudie Mercury Mychart message patient was recommended to have skin testing but at this time patient is not wanting that. Patient will wait to next visit to discuss options of results.

## 2018-11-14 ENCOUNTER — Other Ambulatory Visit: Payer: Self-pay | Admitting: Family Medicine

## 2018-11-14 NOTE — Telephone Encounter (Signed)
Requested medication (s) are due for refill today: yes Requested medication (s) are on the active medication list: yes  Last refill:  10/22/2018  Future visit scheduled: no  Notes to clinic:  Ordering provider is different than pcp.  Review for refill   Requested Prescriptions  Pending Prescriptions Disp Refills   fluticasone (FLONASE) 50 MCG/ACT nasal spray [Pharmacy Med Name: FLUTICASONE PROP 50 MCG SPRAY] 16 mL 1    Sig: Place 1-2 sprays into both nostrils daily.     There is no refill protocol information for this order

## 2018-12-18 ENCOUNTER — Ambulatory Visit: Payer: BC Managed Care – PPO | Admitting: Allergy

## 2018-12-18 ENCOUNTER — Other Ambulatory Visit: Payer: Self-pay

## 2018-12-18 DIAGNOSIS — Z20822 Contact with and (suspected) exposure to covid-19: Secondary | ICD-10-CM

## 2018-12-19 LAB — NOVEL CORONAVIRUS, NAA: SARS-CoV-2, NAA: NOT DETECTED

## 2018-12-25 ENCOUNTER — Ambulatory Visit: Payer: BC Managed Care – PPO

## 2018-12-25 ENCOUNTER — Encounter

## 2018-12-31 ENCOUNTER — Other Ambulatory Visit: Payer: Self-pay

## 2018-12-31 DIAGNOSIS — Z20822 Contact with and (suspected) exposure to covid-19: Secondary | ICD-10-CM

## 2019-01-02 LAB — NOVEL CORONAVIRUS, NAA: SARS-CoV-2, NAA: NOT DETECTED

## 2019-01-07 ENCOUNTER — Ambulatory Visit
Admission: EM | Admit: 2019-01-07 | Discharge: 2019-01-07 | Disposition: A | Discharge status: Home / Self Care | Payer: BC Managed Care – PPO | Attending: Emergency Medicine | Admitting: Emergency Medicine

## 2019-01-07 ENCOUNTER — Other Ambulatory Visit: Payer: Self-pay

## 2019-01-07 DIAGNOSIS — J209 Acute bronchitis, unspecified: Secondary | ICD-10-CM | POA: Diagnosis not present

## 2019-01-07 MED ORDER — AZITHROMYCIN 250 MG PO TABS: 250.0000 mg | ORAL_TABLET | Freq: Every day | ORAL | 0 refills | Status: DC

## 2019-01-07 MED ORDER — BENZONATATE 100 MG PO CAPS: 100.0000 mg | ORAL_CAPSULE | Freq: Three times a day (TID) | ORAL | 0 refills | Status: DC

## 2019-01-07 MED ORDER — ALBUTEROL SULFATE HFA 108 (90 BASE) MCG/ACT IN AERS: 2.0000 | INHALATION_SPRAY | Freq: Four times a day (QID) | RESPIRATORY_TRACT | 1 refills | Status: AC | PRN

## 2019-01-07 NOTE — ED Provider Notes (Signed)
[name]    CSN: [id number] Arrival date & time: 01/07/19  1130      History   Chief Complaint Chief Complaint  Patient presents with  . Cough    HPI Lauren Leonard is a 28 y.o. female with history of mild intermittent asthma, allergies presenting for 8-day course of nasal congestion, productive/nonhemoptic cough, scratchy throat.  Sputum is clear/white.  Patient has had increased dyspnea with wheezing and has not had her inhaler available as its been "years since if needed".  Patient underwent Covid testing 12/31/2018: Chart reviewed by me showing negative result.  Past Medical History:  Diagnosis Date  . Abnormal Pap smear of cervix   . Allergic rhinitis   . Brain cyst   . Headache    migraines  . IBS (irritable bowel syndrome)   . Mild intermittent asthma   . Ovarian cyst   . 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 Chronic rhinitis 10/09/2018  . Mild intermittent asthma without complication [id number]  . Rash and nonspecific skin eruption 10/09/2018  . 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 Abnormal Pap smear 09/26/2017  . Asthma 08/28/2014  . Abnormal Pap smear of cervix 08/28/2014  . Headache(784.0) 08/29/2013  . Allergic rhinitis 05/09/2011    Past Surgical History:  Procedure Laterality Date  . CHOLECYSTECTOMY  06/28/12   lap chole    OB History    Gravida  2   Para  1   Term  1   Preterm      AB  1   Living  1     SAB  1   TAB      Ectopic      Multiple  0   Live Births  1            Home Medications    Prior to Admission medications   Medication Sig Start Date End Date Taking? Authorizing Provider  albuterol (VENTOLIN HFA) 108 (90 Base) MCG/ACT inhaler Inhale 2 puffs into the lungs every 6 (six) hours as needed for wheezing or shortness of breath. 01/07/19   Hall-Potvin, [place], PA-C  azithromycin (ZITHROMAX) 250 MG tablet Take 1 tablet (250 mg total) by mouth  daily. Take first 2 tablets together, then 1 every day until finished. 01/07/19   Hall-Potvin, [place], PA-C  benzonatate (TESSALON) 100 MG capsule Take 1 capsule (100 mg total) by mouth every 8 (eight) hours. 01/07/19   Hall-Potvin, [place], PA-C  diphenhydrAMINE (BENADRYL) 25 MG tablet Take 25 mg by mouth every 6 (six) hours as needed.    [provider]  fluticasone (FLONASE) 50 MCG/ACT nasal spray PLACE 1-2 SPRAYS INTO BOTH NOSTRILS DAILY. 11/18/18   Gildardo Pounds, NP  Homeopathic Products [facility] ALLERGY RELIEF) GEL Place into the nose.    [provider]  triamcinolone cream (KENALOG) 0.1 % Apply 1 application topically 2 (two) times daily as needed. For the rash. Do not use more than 3 weeks at a time. Do not use on the face. 10/09/18   Garnet Sierras, DO    Family History Family History  Problem Relation Age of Onset  . Diabetes Maternal Grandmother   . Hypertension Maternal Grandmother   . Breast cancer Maternal Grandmother   . Diabetes Maternal Grandfather   . Pancreatic cancer Maternal Grandfather   . Migraines Mother   . Hypertension Mother   . Asthma Mother   . Seizures Mother   .  Thyroid disease Mother   . Gout Father   . Cerebral aneurysm Other     Social History Social History   Tobacco Use  . Smoking status: Never Smoker  . Smokeless tobacco: Never Used  . Tobacco comment: husband vapes  Substance Use Topics  . Alcohol use: Yes    Comment: rarely since nursing baby  . Drug use: No     Allergies   Latex, Zoloft [sertraline], and Citric acid   Review of Systems Review of Systems  Constitutional: Negative for fatigue and fever.  HENT: Positive for congestion, postnasal drip and sore throat. Negative for ear pain, sinus pain and voice change.   Eyes: Negative for pain, redness and visual disturbance.  Respiratory: Positive for cough, shortness of breath and wheezing.   Cardiovascular: Negative for chest pain and palpitations.   Gastrointestinal: Negative for abdominal pain, diarrhea and vomiting.  Musculoskeletal: Negative for arthralgias and myalgias.  Skin: Negative for rash and wound.  Neurological: Negative for syncope and headaches.     Physical Exam Triage Vital Signs ED Triage Vitals  Enc Vitals Group     BP      Pulse      Resp      Temp      Temp src      SpO2      Weight      Height      Head Circumference      Peak Flow      Pain Score      Pain Loc      Pain Edu?      Excl. in [place]?    No data found.  Updated Vital Signs BP 111/70 (BP Location: Left Arm)   Pulse 84   Temp 98.4 F (36.9 C) (Oral)   Resp 18   LMP 12/31/2018   SpO2 94%   Visual Acuity Right Eye Distance:   Left Eye Distance:   Bilateral Distance:    Right Eye Near:   Left Eye Near:    Bilateral Near:     Physical Exam Constitutional:      General: She is not in acute distress.    Appearance: She is not toxic-appearing.  HENT:     Head: Normocephalic and atraumatic.     Nose: Congestion present. No rhinorrhea.     Mouth/Throat:     Mouth: Mucous membranes are moist.     Pharynx: Oropharynx is clear. Posterior oropharyngeal erythema present. No oropharyngeal exudate.  Eyes:     General: No scleral icterus.    Conjunctiva/sclera: Conjunctivae normal.     Pupils: Pupils are equal, round, and reactive to light.  Neck:     Musculoskeletal: Neck supple. Muscular tenderness present.  Cardiovascular:     Rate and Rhythm: Normal rate and regular rhythm.     Heart sounds: No murmur. No gallop.   Pulmonary:     Effort: Pulmonary effort is normal. No respiratory distress.     Breath sounds: Wheezing present.     Comments: Diffuse mid to end phase expiratory wheezing with rhonchi that improves with cough Lymphadenopathy:     Cervical: Cervical adenopathy present.  Skin:    Capillary Refill: Capillary refill takes less than 2 seconds.     Coloration: Skin is not jaundiced or pale.  Neurological:      General: No focal deficit present.     Mental Status: She is alert and oriented to person, place, and time.  UC Treatments / Results  Labs (all labs ordered are listed, but only abnormal results are displayed) Labs Reviewed - No data to display  EKG   Radiology No results found.  Procedures Procedures (including critical care time)  Medications Ordered in UC Medications - No data to display  Initial Impression / Assessment and Plan / UC Course  I have reviewed the triage vital signs and the nursing notes.  Pertinent labs & imaging results that were available during my care of the patient were reviewed by me and considered in my medical decision making (see chart for details).     H&P consistent with bronchitis: We will treat with Z-Pak, inhaler, benzonatate.  Covid testing deferred at patient's request as there have been no new exposure since previous test.  Patient is able to work/attend school from home and intends to quarantine for the next week.  Return precautions discussed, patient verbalized understanding and is agreeable to plan. Final Clinical Impressions(s) / UC Diagnoses   Final diagnoses:  Acute bronchitis, unspecified organism     Discharge Instructions     Take Z-Pak as instructed. Use albuterol inhaler as needed for chest tightness, wheezing, shortness of breath. Important to take Flonase, daytime allergy medication daily. Important to increase water intake: May also use vapor/steam to help keep airways soft. Take benzonatate up to 3 times daily as needed for cough. Return for worsening cough, shortness of breath, fever.    ED Prescriptions    Medication Sig Dispense Auth. Provider   albuterol (VENTOLIN HFA) 108 (90 Base) MCG/ACT inhaler Inhale 2 puffs into the lungs every 6 (six) hours as needed for wheezing or shortness of breath. 6.7 g Hall-Potvin, [place], PA-C   benzonatate (TESSALON) 100 MG capsule Take 1 capsule (100 mg total) by mouth  every 8 (eight) hours. 21 capsule Hall-Potvin, [place], PA-C   azithromycin (ZITHROMAX) 250 MG tablet Take 1 tablet (250 mg total) by mouth daily. Take first 2 tablets together, then 1 every day until finished. 6 tablet Hall-Potvin, [place], PA-C     PDMP not reviewed this encounter.   Hall-Potvin, [place], [place] 01/08/19 [phone]

## 2019-01-07 NOTE — Discharge Instructions (Addendum)
Take Z-Pak as instructed. Use albuterol inhaler as needed for chest tightness, wheezing, shortness of breath. Important to take Flonase, daytime allergy medication daily. Important to increase water intake: May also use vapor/steam to help keep airways soft. Take benzonatate up to 3 times daily as needed for cough. Return for worsening cough, shortness of breath, fever.

## 2019-01-07 NOTE — ED Triage Notes (Signed)
Pt c/o stuffy nose, productive cough, and scratchy throat for over a wk. Has a neg. COVID test last week.

## 2019-01-08 ENCOUNTER — Other Ambulatory Visit: Payer: Self-pay

## 2019-01-08 DIAGNOSIS — Z20822 Contact with and (suspected) exposure to covid-19: Secondary | ICD-10-CM

## 2019-01-10 LAB — NOVEL CORONAVIRUS, NAA: SARS-CoV-2, NAA: NOT DETECTED

## 2019-01-24 ENCOUNTER — Ambulatory Visit (INDEPENDENT_AMBULATORY_CARE_PROVIDER_SITE_OTHER): Payer: BC Managed Care – PPO

## 2019-01-24 ENCOUNTER — Ambulatory Visit
Admission: EM | Admit: 2019-01-24 | Discharge: 2019-01-24 | Disposition: A | Discharge status: Home / Self Care | Payer: BC Managed Care – PPO | Attending: Emergency Medicine | Admitting: Emergency Medicine

## 2019-01-24 DIAGNOSIS — J40 Bronchitis, not specified as acute or chronic: Secondary | ICD-10-CM

## 2019-01-24 MED ORDER — PREDNISONE 10 MG (21) PO TBPK: ORAL_TABLET | Freq: Every day | ORAL | 0 refills | Status: DC

## 2019-01-24 MED ORDER — LIDOCAINE VISCOUS HCL 2 % MT SOLN: 15.0000 mL | OROMUCOSAL | 0 refills | Status: DC | PRN

## 2019-01-24 MED ORDER — GUAIFENESIN-DM 100-10 MG/5ML PO SYRP: 5.0000 mL | ORAL_SOLUTION | ORAL | 0 refills | Status: DC | PRN

## 2019-01-24 NOTE — Discharge Instructions (Signed)
Please continue current regimen: We will add prednisone taper, lidocaine, Robitussin-DM as needed for additional symptom relief. Important follow-up with your primary care as pulmonology referral may be needed if symptoms persist. Go to ER for worsening cough, shortness of breath, chest pain.

## 2019-01-24 NOTE — ED Triage Notes (Signed)
Pt states dx with bronchitis 3 wks ago, states cough and itchy throat is worse. States vomiting with am.

## 2019-01-24 NOTE — ED Provider Notes (Signed)
[name]    CSN: [id number] Arrival date & time: 01/24/19  1519      History   Chief Complaint Chief Complaint  Patient presents with  . Cough    HPI Lauren Leonard is a 28 y.o. female with history of asthma, allergies presenting for persistent upper respiratory symptoms.  Patient initially evaluated on 10/20 by this provider: Please see HPI from that note.  Patient completed Z-Pak, has been using albuterol inhaler as needed with some relief of symptoms.  States cough is no longer productive: Denies hemoptysis, chest pain, shortness of breath.  No change in Covid exposure.  Since last visit, patient has started taking antihistamine without adverse effect.  Of note, patient did have single episode of emesis after coughing.  Patient denies whooping/bark-like cough, fever.  Patient denies bile or blood in her emesis.  Patient has had some nausea, though reports adequate oral intake.   Past Medical History:  Diagnosis Date  . Abnormal Pap smear of cervix   . Allergic rhinitis   . Brain cyst   . Headache    migraines  . IBS (irritable bowel syndrome)   . Mild intermittent asthma   . Ovarian cyst   . 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 Chronic rhinitis 10/09/2018  . Mild intermittent asthma without complication [id number]  . Rash and nonspecific skin eruption 10/09/2018  . 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 Abnormal Pap smear 09/26/2017  . Asthma 08/28/2014  . Abnormal Pap smear of cervix 08/28/2014  . Headache(784.0) 08/29/2013  . Allergic rhinitis 05/09/2011    Past Surgical History:  Procedure Laterality Date  . CHOLECYSTECTOMY  06/28/12   lap chole    OB History    Gravida  2   Para  1   Term  1   Preterm      AB  1   Living  1     SAB  1   TAB      Ectopic      Multiple  0   Live Births  1            Home Medications    Prior to Admission medications   Medication Sig  Start Date End Date Taking? Authorizing Provider  albuterol (VENTOLIN HFA) 108 (90 Base) MCG/ACT inhaler Inhale 2 puffs into the lungs every 6 (six) hours as needed for wheezing or shortness of breath. 01/07/19   Hall-Potvin, [place], PA-C  diphenhydrAMINE (BENADRYL) 25 MG tablet Take 25 mg by mouth every 6 (six) hours as needed.    [provider]  fluticasone (FLONASE) 50 MCG/ACT nasal spray PLACE 1-2 SPRAYS INTO BOTH NOSTRILS DAILY. 11/18/18   Gildardo Pounds, NP  guaiFENesin-dextromethorphan (ROBITUSSIN DM) 100-10 MG/5ML syrup Take 5 mLs by mouth every 4 (four) hours as needed for cough. 01/24/19   Hall-Potvin, [place], PA-C  Homeopathic Products [facility] ALLERGY RELIEF) GEL Place into the nose.    [provider]  lidocaine (XYLOCAINE) 2 % solution Use as directed 15 mLs in the mouth or throat as needed for mouth pain. 01/24/19   Hall-Potvin, [place], PA-C  predniSONE (STERAPRED UNI-PAK 21 TAB) 10 MG (21) TBPK tablet Take by mouth daily. Take steroid taper as written 01/24/19   Hall-Potvin, [place], PA-C  triamcinolone cream (KENALOG) 0.1 % Apply 1 application topically 2 (two) times daily as needed. For the rash. Do not use more than 3 weeks at a  time. Do not use on the face. 10/09/18   Garnet Sierras, DO    Family History Family History  Problem Relation Age of Onset  . Diabetes Maternal Grandmother   . Hypertension Maternal Grandmother   . Breast cancer Maternal Grandmother   . Diabetes Maternal Grandfather   . Pancreatic cancer Maternal Grandfather   . Migraines Mother   . Hypertension Mother   . Asthma Mother   . Seizures Mother   . Thyroid disease Mother   . Gout Father   . Cerebral aneurysm Other     Social History Social History   Tobacco Use  . Smoking status: Never Smoker  . Smokeless tobacco: Never Used  . Tobacco comment: husband vapes  Substance Use Topics  . Alcohol use: Yes    Comment: rarely since nursing baby  . Drug use: No     Allergies    Latex, Zoloft [sertraline], and Citric acid   Review of Systems Review of Systems  Constitutional: Negative for activity change, appetite change, fatigue and fever.  HENT: Positive for sore throat. Negative for congestion, dental problem, ear pain, facial swelling, hearing loss, sinus pressure, sinus pain, sneezing, tinnitus, trouble swallowing and voice change.   Eyes: Negative for photophobia, pain and visual disturbance.  Respiratory: Positive for cough. Negative for chest tightness, shortness of breath, wheezing and stridor.   Cardiovascular: Negative for chest pain and palpitations.  Gastrointestinal: Positive for nausea and vomiting. Negative for abdominal distention, abdominal pain, blood in stool, constipation and diarrhea.       Emesis x1 this morning.  Not currently nauseous in office.  Musculoskeletal: Negative for arthralgias and myalgias.  Neurological: Negative for dizziness and headaches.  Psychiatric/Behavioral: Negative for agitation and sleep disturbance.    Physical Exam Triage Vital Signs ED Triage Vitals [01/24/19 1536]  Enc Vitals Group     BP (!) 109/59     Pulse Rate 91     Resp 18     Temp 98.6 F (37 C)     Temp Source Oral     SpO2 96 %     Weight      Height      Head Circumference      Peak Flow      Pain Score 3     Pain Loc      Pain Edu?      Excl. in [place]?    No data found.  Updated Vital Signs BP (!) 109/59 (BP Location: Left Arm)   Pulse 91   Temp 98.6 F (37 C) (Oral)   Resp 18   LMP 12/31/2018   SpO2 96%    Physical Exam Constitutional:      General: She is not in acute distress.    Appearance: She is obese. She is not ill-appearing.  HENT:     Head: Normocephalic and atraumatic.     Jaw: There is normal jaw occlusion. No tenderness or pain on movement.     Right Ear: Hearing, tympanic membrane, ear canal and external ear normal. No tenderness. No mastoid tenderness.     Left Ear: Hearing, tympanic membrane, ear canal and  external ear normal. No tenderness. No mastoid tenderness.     Nose: No nasal deformity, septal deviation or nasal tenderness.     Right Turbinates: Not swollen or pale.     Left Turbinates: Not swollen or pale.     Right Sinus: No maxillary sinus tenderness or frontal sinus tenderness.  Left Sinus: No maxillary sinus tenderness or frontal sinus tenderness.     Mouth/Throat:     Lips: Pink. No lesions.     Mouth: Mucous membranes are moist. No injury.     Pharynx: Oropharynx is clear. Uvula midline. No posterior oropharyngeal erythema or uvula swelling.     Comments: no tonsillar exudate or hypertrophy Eyes:     General: No scleral icterus.    Pupils: Pupils are equal, round, and reactive to light.  Neck:     Musculoskeletal: Normal range of motion and neck supple. No muscular tenderness.  Cardiovascular:     Rate and Rhythm: Normal rate and regular rhythm.     Heart sounds: No murmur. No gallop.   Pulmonary:     Effort: Pulmonary effort is normal. No respiratory distress.     Breath sounds: No stridor. No wheezing, rhonchi or rales.  Lymphadenopathy:     Cervical: No cervical adenopathy.  Skin:    Capillary Refill: Capillary refill takes less than 2 seconds.     Coloration: Skin is not jaundiced or pale.  Neurological:     General: No focal deficit present.     Mental Status: She is alert and oriented to person, place, and time.      UC Treatments / Results  Labs (all labs ordered are listed, but only abnormal results are displayed) Labs Reviewed - No data to display  EKG   Radiology Dg Chest 2 View  Result Date: 01/24/2019 CLINICAL DATA:  Cough for 3 weeks EXAM: CHEST - 2 VIEW COMPARISON:  November 15, 2012. FINDINGS: Lungs are clear. Heart size and pulmonary vascularity are normal. No adenopathy. No bone lesions. IMPRESSION: No edema or consolidation. Electronically Signed   By: Lowella Grip III M.D.   On: 01/24/2019 15:57    Procedures Procedures (including  critical care time)  Medications Ordered in UC Medications - No data to display  Initial Impression / Assessment and Plan / UC Course  I have reviewed the triage vital signs and the nursing notes.  Pertinent labs & imaging results that were available during my care of the patient were reviewed by me and considered in my medical decision making (see chart for details).     CXR done office, reviewed by me radiology compared to previous from 2014: No evidence of consolidation, edema.  Centor criteria 0: Rapid strep deferred.  Will treat supportively as outlined below and add prednisone taper given patient's history of asthma in conjunction with a single episode of posttussive emesis as bronchospasm, reactive airway could be contributory.  Patient to follow-up with PCP (& pulmonology if needed) for persistent, worsening symptoms.  Return precautions discussed, patient verbalized understanding and is agreeable to plan. Final Clinical Impressions(s) / UC Diagnoses   Final diagnoses:  Bronchitis     Discharge Instructions     Please continue current regimen: We will add prednisone taper, lidocaine, Robitussin-DM as needed for additional symptom relief. Important follow-up with your primary care as pulmonology referral may be needed if symptoms persist. Go to ER for worsening cough, shortness of breath, chest pain.    ED Prescriptions    Medication Sig Dispense Auth. Provider   guaiFENesin-dextromethorphan (ROBITUSSIN DM) 100-10 MG/5ML syrup Take 5 mLs by mouth every 4 (four) hours as needed for cough. 118 mL Hall-Potvin, [place], PA-C   lidocaine (XYLOCAINE) 2 % solution Use as directed 15 mLs in the mouth or throat as needed for mouth pain. 100 mL Hall-Potvin, [place], PA-C  predniSONE (STERAPRED UNI-PAK 21 TAB) 10 MG (21) TBPK tablet Take by mouth daily. Take steroid taper as written 21 tablet Hall-Potvin, [place], PA-C     PDMP not reviewed this encounter.   Hall-Potvin,  [place], [place] 01/24/19 1616

## 2019-01-28 ENCOUNTER — Other Ambulatory Visit: Payer: Self-pay

## 2019-01-28 DIAGNOSIS — Z20822 Contact with and (suspected) exposure to covid-19: Secondary | ICD-10-CM

## 2019-01-29 LAB — NOVEL CORONAVIRUS, NAA: SARS-CoV-2, NAA: NOT DETECTED

## 2019-06-17 ENCOUNTER — Other Ambulatory Visit: Payer: Self-pay | Admitting: Physician Assistant

## 2019-06-17 ENCOUNTER — Other Ambulatory Visit (HOSPITAL_COMMUNITY)
Admission: RE | Admit: 2019-06-17 | Discharge: 2019-06-17 | Disposition: A | Discharge status: Home / Self Care | Payer: BC Managed Care – PPO | Source: Ambulatory Visit | Attending: Physician Assistant | Admitting: Physician Assistant

## 2019-06-17 DIAGNOSIS — Z124 Encounter for screening for malignant neoplasm of cervix: Secondary | ICD-10-CM | POA: Insufficient documentation

## 2019-06-23 LAB — CYTOLOGY - PAP
Chlamydia: NEGATIVE
Comment: NEGATIVE
Comment: NEGATIVE
Comment: NEGATIVE
Comment: NORMAL
Diagnosis: UNDETERMINED — AB
High risk HPV: POSITIVE — AB
Neisseria Gonorrhea: NEGATIVE
Trichomonas: NEGATIVE

## 2019-07-18 ENCOUNTER — Other Ambulatory Visit: Payer: Self-pay | Admitting: Obstetrics & Gynecology

## 2019-08-19 ENCOUNTER — Other Ambulatory Visit (INDEPENDENT_AMBULATORY_CARE_PROVIDER_SITE_OTHER): Payer: Self-pay | Admitting: Primary Care

## 2019-08-19 MED ORDER — BUDESONIDE-FORMOTEROL FUMARATE 80-4.5 MCG/ACT IN AERO: 2.0000 | INHALATION_SPRAY | Freq: Two times a day (BID) | RESPIRATORY_TRACT | 3 refills | Status: DC

## 2019-08-28 ENCOUNTER — Other Ambulatory Visit: Payer: Self-pay | Admitting: Obstetrics & Gynecology

## 2019-12-15 ENCOUNTER — Other Ambulatory Visit: Payer: Self-pay | Admitting: Family Medicine

## 2019-12-15 DIAGNOSIS — R1013 Epigastric pain: Secondary | ICD-10-CM

## 2019-12-23 ENCOUNTER — Ambulatory Visit
Admission: RE | Admit: 2019-12-23 | Discharge: 2019-12-23 | Disposition: A | Discharge status: Home / Self Care | Payer: BC Managed Care – PPO | Source: Ambulatory Visit | Attending: Family Medicine | Admitting: Family Medicine

## 2019-12-23 DIAGNOSIS — R1013 Epigastric pain: Secondary | ICD-10-CM

## 2020-06-18 DIAGNOSIS — O021 Missed abortion: Secondary | ICD-10-CM

## 2020-06-18 HISTORY — DX: Missed abortion: O02.1

## 2020-07-01 ENCOUNTER — Other Ambulatory Visit: Payer: Self-pay | Admitting: Obstetrics and Gynecology

## 2020-07-01 ENCOUNTER — Other Ambulatory Visit (HOSPITAL_COMMUNITY): Payer: Self-pay | Admitting: Obstetrics and Gynecology

## 2020-07-01 ENCOUNTER — Encounter (HOSPITAL_BASED_OUTPATIENT_CLINIC_OR_DEPARTMENT_OTHER): Payer: Self-pay | Admitting: Obstetrics and Gynecology

## 2020-07-01 ENCOUNTER — Other Ambulatory Visit: Payer: Self-pay

## 2020-07-01 DIAGNOSIS — O021 Missed abortion: Secondary | ICD-10-CM

## 2020-07-01 NOTE — Progress Notes (Signed)
Spoke w/ via phone for pre-op interview--- PT Lab needs dos---- no (per anes)/  Pre-op orders pending              Lab results------ no COVID test ------ 07-06-2020 @ 0930 Arrive at ------- 1115 on 07-08-2020 NPO after MN NO Solid Food.  Clear liquids from MN until--- 1015 Med rec completed Medications to take morning of surgery ----- Symbicort inhaler and nasal spray Diabetic medication ----- n/a Patient instructed to bring photo id and insurance card day of surgery Patient aware to have Driver (ride ) / caregiver  for 24 hours after surgery  -- mother, Lauren Leonard Patient Special Instructions ----- n/a Pre-Op special Istructions ----- case just added on today, pre-op orders pending Patient verbalized understanding of instructions that were given at this phone interview. Patient denies shortness of breath, chest pain, fever, cough at this phone interview.

## 2020-07-06 ENCOUNTER — Other Ambulatory Visit (HOSPITAL_COMMUNITY)
Admission: RE | Admit: 2020-07-06 | Discharge: 2020-07-06 | Disposition: A | Discharge status: Home / Self Care | Payer: BC Managed Care – PPO | Source: Ambulatory Visit | Attending: Obstetrics and Gynecology | Admitting: Obstetrics and Gynecology

## 2020-07-06 DIAGNOSIS — Z01812 Encounter for preprocedural laboratory examination: Secondary | ICD-10-CM | POA: Insufficient documentation

## 2020-07-06 DIAGNOSIS — Z20822 Contact with and (suspected) exposure to covid-19: Secondary | ICD-10-CM | POA: Insufficient documentation

## 2020-07-06 DIAGNOSIS — Z91040 Latex allergy status: Secondary | ICD-10-CM | POA: Diagnosis not present

## 2020-07-06 DIAGNOSIS — Z888 Allergy status to other drugs, medicaments and biological substances status: Secondary | ICD-10-CM | POA: Diagnosis not present

## 2020-07-06 DIAGNOSIS — O021 Missed abortion: Secondary | ICD-10-CM | POA: Diagnosis present

## 2020-07-06 DIAGNOSIS — Z3A01 Less than 8 weeks gestation of pregnancy: Secondary | ICD-10-CM | POA: Diagnosis not present

## 2020-07-06 LAB — SARS CORONAVIRUS 2 (TAT 6-24 HRS): SARS Coronavirus 2: NEGATIVE

## 2020-07-07 NOTE — H&P (Signed)
Lauren Leonard is an 30 y.o. [id number] admitted for Dilation and Suction Curettage under US guidance for early pregnancy loss.  She has had two ultrasounds (as documented) below confirming early pregnancy loss. Risks/benefits for expectant vs medical vs surgical management were reviewed in full and patient opts for surgical management. She adamantly desires repeat US confirming demise prior to procedure. She has not had any cramping or bleeding.  Early OB US: 06/16/20: [redacted]w[redacted]d, mean sac diameter 1.73cm, gestational sac measuring [redacted]w[redacted]d (size less than dates), yolk sac seen, no fetal heart rate or fetal pole visualized. Cervical length 3.4cm. R ovary wnl. L ovary - complex solid-appearing mass 2.5 x 2.5 x 2.2cm, no blood flow noted. Small uterine fibroids - anterior 1.0 x 0.5cm and 1.2 x 0.9cm.  4/11/222: [redacted]w[redacted]d, CRL 0.514cm, yolk sac and amnion seen. Single IUP with fetal pole but no fetal heart rate. Cervical length 4.6cm. R ovary wnl. L ovary with a 2.4 x 2.1 x 1.7cm - complex cyst appears consistent with hemorrhagic corpus luteal cyst. No free fluid or adnexal masses seen.  Patient Active Problem List   Diagnosis Date Noted  . Chronic rhinitis 10/09/2018  . Mild intermittent asthma without complication 32/99/2426  . Rash and nonspecific skin eruption 10/09/2018  . 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 Abnormal Pap smear 09/26/2017  . Asthma 08/28/2014  . Abnormal Pap smear of cervix 08/28/2014  . Headache(784.0) 08/29/2013  . Allergic rhinitis 05/09/2011    MEDICAL/FAMILY/SOCIAL HX: Patient's last menstrual period was 05/01/2020.    Past Medical History:  Diagnosis Date  . Chronic rhinitis   . 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 Leta Baptist, lov note in epic 05-03-2016, last MRI 05-31-2016 stable)  . GERD (gastroesophageal reflux disease)   . History of cervical dysplasia    06/ 2021  s/p  LEEP cone biospy for CIN  2  . History of ovarian cyst   . IBS (irritable bowel syndrome)   . IBS (irritable bowel syndrome)   . Migraines    07-01-2020  per pt started 2015 and found to have brain colloid cyst,  currently only occasional migraines takes excedrine migraine  . Mild intermittent asthma    followed by pcp  . Missed ab 06/2020    Past Surgical History:  Procedure Laterality Date  . LAPAROSCOPIC CHOLECYSTECTOMY  06/28/12    Family History  Problem Relation Age of Onset  . Diabetes Maternal Grandmother   . Hypertension Maternal Grandmother   . Breast cancer Maternal Grandmother   . Diabetes Maternal Grandfather   . Pancreatic cancer Maternal Grandfather   . Migraines Mother   . Hypertension Mother   . Asthma Mother   . Seizures Mother   . Thyroid disease Mother   . Gout Father   . Cerebral aneurysm Other     Social History:  reports that she has never smoked. She has never used smokeless tobacco. She reports current alcohol use. She reports that she does not use drugs.  ALLERGIES/MEDS:  Allergies:  Allergies  Allergen Reactions  . Zoloft [Sertraline]     Makes anxious  . Citric Acid Rash    Breakouts on chest and back.  . Latex Rash and Other (See Comments)    Itches, breaks my hands out, burning    No medications prior to admission.     Review of Systems  Constitutional: Negative.   HENT: Negative.   Eyes:  Negative.   Respiratory: Negative.   Cardiovascular: Negative.   Gastrointestinal: Negative.   Genitourinary: Negative.   Musculoskeletal: Negative.   Skin: Negative.   Neurological: Negative.   Endo/Heme/Allergies: Negative.   Psychiatric/Behavioral: Negative.  Memory loss:     Height 5\' 6"  (1.676 m), weight 108.9 kg, last menstrual period 05/01/2020, not currently breastfeeding. Gen:  NAD Cardio:  RRR Lungs:  CTAB, no wheezes/rales/rhonchi Abd:  Soft, non-distended, non-tender throughout Ext:  No bilateral LE edema  No results found for this or any  previous visit (from the past 24 hour(s)).  No results found.   ASSESSMENT/PLAN: Lauren Leonard is a 30 y.o. G2P1011 who is admitted for Dilation and Suction Curettage under US guidance for early pregnancy loss.  - Admit to [facility] - Admit labs (CBC, T&S, COVID screen) - Diet:  NPO - IVF:  Per anesthesia - VTE Prophylaxis:  SCDs - Antibiotics:  Doxycycline 200mg  on call to OR - D/C home same day  Consents: I discussed with the patient that this surgery is performed to remove products of conception due to early pregnancy loss. Prior to surgery, the risks and benefits of the surgery, as well as alternative treatments, have been discussed.  The risks include, but are not limited to bleeding, including the need for a blood transfusion, infection, damage to organs and tissues, including uterine perforation, requiring additional surgery, postoperative pain, short-term and long-term, failure of the procedure to control symptoms, need for hysterectomy to control bleeding, inability to safely complete the procedure, deep vein thrombosis and/or pulmonary embolism, painful intercourse, complications the course of which cannot be predicted or prevented, and death.  Patient was consented for blood products.  The patient is aware that bleeding may result in the need for a blood transfusion which includes risk of transmission of HIV (1:2 million), Hepatitis C (1:2 million), and Hepatitis B (1:200 thousand) and transfusion reaction.  Patient voiced understanding of the above risks as well as understanding of indications for blood transfusion.   [name], DO [phone] (office)

## 2020-07-08 ENCOUNTER — Other Ambulatory Visit: Payer: Self-pay

## 2020-07-08 ENCOUNTER — Ambulatory Visit (HOSPITAL_BASED_OUTPATIENT_CLINIC_OR_DEPARTMENT_OTHER)
Admission: RE | Admit: 2020-07-08 | Discharge: 2020-07-08 | Disposition: A | Discharge status: Home / Self Care | Payer: BC Managed Care – PPO | Attending: Obstetrics and Gynecology | Admitting: Obstetrics and Gynecology

## 2020-07-08 ENCOUNTER — Ambulatory Visit (HOSPITAL_BASED_OUTPATIENT_CLINIC_OR_DEPARTMENT_OTHER): Payer: BC Managed Care – PPO | Admitting: Anesthesiology

## 2020-07-08 ENCOUNTER — Encounter (HOSPITAL_BASED_OUTPATIENT_CLINIC_OR_DEPARTMENT_OTHER)
Admission: RE | Disposition: A | Discharge status: Home / Self Care | Payer: Self-pay | Source: Home / Self Care | Attending: Obstetrics and Gynecology

## 2020-07-08 ENCOUNTER — Encounter (HOSPITAL_BASED_OUTPATIENT_CLINIC_OR_DEPARTMENT_OTHER): Payer: Self-pay | Admitting: Obstetrics and Gynecology

## 2020-07-08 ENCOUNTER — Ambulatory Visit (HOSPITAL_COMMUNITY)
Admission: RE | Admit: 2020-07-08 | Discharge: 2020-07-08 | Disposition: A | Discharge status: Home / Self Care | Payer: BC Managed Care – PPO | Source: Ambulatory Visit | Attending: Obstetrics and Gynecology | Admitting: Obstetrics and Gynecology

## 2020-07-08 DIAGNOSIS — Z91040 Latex allergy status: Secondary | ICD-10-CM | POA: Insufficient documentation

## 2020-07-08 DIAGNOSIS — Z888 Allergy status to other drugs, medicaments and biological substances status: Secondary | ICD-10-CM | POA: Insufficient documentation

## 2020-07-08 DIAGNOSIS — O021 Missed abortion: Secondary | ICD-10-CM | POA: Diagnosis not present

## 2020-07-08 DIAGNOSIS — Z3A01 Less than 8 weeks gestation of pregnancy: Secondary | ICD-10-CM | POA: Insufficient documentation

## 2020-07-08 DIAGNOSIS — Z20822 Contact with and (suspected) exposure to covid-19: Secondary | ICD-10-CM | POA: Insufficient documentation

## 2020-07-08 HISTORY — DX: Migraine, unspecified, not intractable, without status migrainosus: G43.909

## 2020-07-08 HISTORY — DX: Personal history of other diseases of the female genital tract: Z87.42

## 2020-07-08 HISTORY — DX: Chronic rhinitis: J31.0

## 2020-07-08 HISTORY — DX: Congenital cerebral cysts: Q04.6

## 2020-07-08 HISTORY — DX: Gastro-esophageal reflux disease without esophagitis: K21.9

## 2020-07-08 HISTORY — PX: DILATION AND EVACUATION: SHX1459

## 2020-07-08 HISTORY — PX: OPERATIVE ULTRASOUND: SHX5996

## 2020-07-08 HISTORY — DX: Personal history of cervical dysplasia: Z87.410

## 2020-07-08 LAB — TYPE AND SCREEN
ABO/RH(D): O POS
Antibody Screen: NEGATIVE

## 2020-07-08 LAB — CBC
HCT: 45.7 % (ref 36.0–46.0)
Hemoglobin: 14.6 g/dL (ref 12.0–15.0)
MCH: 30.8 pg (ref 26.0–34.0)
MCHC: 31.9 g/dL (ref 30.0–36.0)
MCV: 96.4 fL (ref 80.0–100.0)
Platelets: 286 10*3/uL (ref 150–400)
RBC: 4.74 MIL/uL (ref 3.87–5.11)
RDW: 11.9 % (ref 11.5–15.5)
WBC: 5.2 10*3/uL (ref 4.0–10.5)
nRBC: 0 % (ref 0.0–0.2)

## 2020-07-08 SURGERY — DILATION AND EVACUATION, UTERUS
Anesthesia: Monitor Anesthesia Care | Site: Vagina

## 2020-07-08 MED ORDER — METHYLERGONOVINE MALEATE 0.2 MG/ML IJ SOLN
INTRAMUSCULAR | Status: AC
  Filled 2020-07-08: qty 1

## 2020-07-08 MED ORDER — FENTANYL CITRATE (PF) 100 MCG/2ML IJ SOLN
INTRAMUSCULAR | Status: DC | PRN
  Administered 2020-07-08 (×2): 50 ug via INTRAVENOUS

## 2020-07-08 MED ORDER — OXYCODONE HCL 5 MG PO TABS
5.0000 mg | ORAL_TABLET | Freq: Once | ORAL | Status: AC | PRN
  Administered 2020-07-08: 5 mg via ORAL

## 2020-07-08 MED ORDER — MIDAZOLAM HCL 5 MG/5ML IJ SOLN
INTRAMUSCULAR | Status: DC | PRN
  Administered 2020-07-08: 2 mg via INTRAVENOUS

## 2020-07-08 MED ORDER — DOXYCYCLINE HYCLATE 100 MG PO TABS
ORAL_TABLET | ORAL | Status: AC
  Filled 2020-07-08: qty 2

## 2020-07-08 MED ORDER — LIDOCAINE 2% (20 MG/ML) 5 ML SYRINGE
INTRAMUSCULAR | Status: AC
  Filled 2020-07-08: qty 5

## 2020-07-08 MED ORDER — ONDANSETRON HCL 4 MG/2ML IJ SOLN
INTRAMUSCULAR | Status: DC | PRN
  Administered 2020-07-08: 4 mg via INTRAVENOUS

## 2020-07-08 MED ORDER — PROPOFOL 500 MG/50ML IV EMUL
INTRAVENOUS | Status: AC
  Filled 2020-07-08: qty 50

## 2020-07-08 MED ORDER — LACTATED RINGERS IV SOLN: INTRAVENOUS | Status: DC

## 2020-07-08 MED ORDER — DOXYCYCLINE HYCLATE 100 MG PO TABS
200.0000 mg | ORAL_TABLET | ORAL | Status: AC
  Administered 2020-07-08: 200 mg via ORAL

## 2020-07-08 MED ORDER — ONDANSETRON HCL 4 MG/2ML IJ SOLN
INTRAMUSCULAR | Status: AC
  Filled 2020-07-08: qty 2

## 2020-07-08 MED ORDER — PROPOFOL 10 MG/ML IV BOLUS
INTRAVENOUS | Status: DC | PRN
  Administered 2020-07-08: 30 mg via INTRAVENOUS
  Administered 2020-07-08: 40 mg via INTRAVENOUS
  Administered 2020-07-08: 30 mg via INTRAVENOUS

## 2020-07-08 MED ORDER — KETOROLAC TROMETHAMINE 30 MG/ML IJ SOLN
INTRAMUSCULAR | Status: DC | PRN
  Administered 2020-07-08: 30 mg via INTRAVENOUS

## 2020-07-08 MED ORDER — SILVER NITRATE-POT NITRATE 75-25 % EX MISC
CUTANEOUS | Status: DC | PRN
  Administered 2020-07-08: 6

## 2020-07-08 MED ORDER — OXYCODONE HCL 5 MG PO TABS
ORAL_TABLET | ORAL | Status: AC
  Filled 2020-07-08: qty 1

## 2020-07-08 MED ORDER — FENTANYL CITRATE (PF) 100 MCG/2ML IJ SOLN
25.0000 ug | INTRAMUSCULAR | Status: DC | PRN
  Administered 2020-07-08: 50 ug via INTRAVENOUS

## 2020-07-08 MED ORDER — PROPOFOL 500 MG/50ML IV EMUL
INTRAVENOUS | Status: DC | PRN
  Administered 2020-07-08: 100 ug/kg/min via INTRAVENOUS

## 2020-07-08 MED ORDER — OXYCODONE HCL 5 MG/5ML PO SOLN: 5.0000 mg | Freq: Once | ORAL | Status: AC | PRN

## 2020-07-08 MED ORDER — IBUPROFEN 800 MG PO TABS: 800.0000 mg | ORAL_TABLET | Freq: Three times a day (TID) | ORAL | 0 refills | Status: DC | PRN

## 2020-07-08 MED ORDER — FENTANYL CITRATE (PF) 100 MCG/2ML IJ SOLN
INTRAMUSCULAR | Status: AC
  Filled 2020-07-08: qty 2

## 2020-07-08 MED ORDER — LIDOCAINE 2% (20 MG/ML) 5 ML SYRINGE
INTRAMUSCULAR | Status: DC | PRN
  Administered 2020-07-08: 100 mg via INTRAVENOUS

## 2020-07-08 MED ORDER — PHENYLEPHRINE 40 MCG/ML (10ML) SYRINGE FOR IV PUSH (FOR BLOOD PRESSURE SUPPORT)
PREFILLED_SYRINGE | INTRAVENOUS | Status: AC
  Filled 2020-07-08: qty 10

## 2020-07-08 MED ORDER — DEXAMETHASONE SODIUM PHOSPHATE 10 MG/ML IJ SOLN
INTRAMUSCULAR | Status: DC | PRN
  Administered 2020-07-08: 8 mg via INTRAVENOUS

## 2020-07-08 MED ORDER — CARBOPROST TROMETHAMINE 250 MCG/ML IM SOLN
INTRAMUSCULAR | Status: AC
  Filled 2020-07-08: qty 1

## 2020-07-08 MED ORDER — MIDAZOLAM HCL 2 MG/2ML IJ SOLN
INTRAMUSCULAR | Status: AC
  Filled 2020-07-08: qty 2

## 2020-07-08 MED ORDER — KETOROLAC TROMETHAMINE 30 MG/ML IJ SOLN
INTRAMUSCULAR | Status: AC
  Filled 2020-07-08: qty 1

## 2020-07-08 SURGICAL SUPPLY — 23 items
CATH ROBINSON RED A/P 16FR (CATHETERS) ×4 IMPLANT
DECANTER SPIKE VIAL GLASS SM (MISCELLANEOUS) ×2 IMPLANT
FILTER UTR ASPR ASSEMBLY (MISCELLANEOUS) ×2 IMPLANT
GLOVE SURG ENC MOIS LTX SZ6.5 (GLOVE) ×4 IMPLANT
GLOVE SURG UNDER POLY LF SZ6.5 (GLOVE) ×2 IMPLANT
GLOVE SURG UNDER POLY LF SZ7.5 (GLOVE) ×6 IMPLANT
GOWN STRL REUS W/TWL LRG LVL3 (GOWN DISPOSABLE) ×4 IMPLANT
HOSE CONNECTING 18IN BERKELEY (TUBING) ×2 IMPLANT
KIT BERKELEY 1ST TRI 3/8 NO TR (MISCELLANEOUS) ×2 IMPLANT
KIT BERKELEY 1ST TRIMESTER 3/8 (MISCELLANEOUS) ×2 IMPLANT
KIT TURNOVER CYSTO (KITS) ×2 IMPLANT
NS IRRIG 1000ML POUR BTL (IV SOLUTION) ×2 IMPLANT
PACK VAGINAL MINOR WOMEN LF (CUSTOM PROCEDURE TRAY) ×2 IMPLANT
PAD OB MATERNITY 4.3X12.25 (PERSONAL CARE ITEMS) ×2 IMPLANT
SCOPETTES 8  STERILE (MISCELLANEOUS) ×2
SCOPETTES 8 STERILE (MISCELLANEOUS) ×1 IMPLANT
SET BERKELEY SUCTION TUBING (SUCTIONS) ×2 IMPLANT
TOWEL OR NON WOVEN STRL DISP B (DISPOSABLE) ×4 IMPLANT
UNDERPAD 30X36 HEAVY ABSORB (UNDERPADS AND DIAPERS) ×2 IMPLANT
VACURETTE 10 RIGID CVD (CANNULA) IMPLANT
VACURETTE 7MM CVD STRL WRAP (CANNULA) ×2 IMPLANT
VACURETTE 8 RIGID CVD (CANNULA) IMPLANT
VACURETTE 9 RIGID CVD (CANNULA) IMPLANT

## 2020-07-08 NOTE — Anesthesia Preprocedure Evaluation (Addendum)
Anesthesia Evaluation  Patient identified by MRN, date of birth, ID band Patient awake    Reviewed: Allergy & Precautions, NPO status , Patient's Chart, lab work & pertinent test results  Airway Mallampati: II  TM Distance: >3 FB     Dental   Pulmonary asthma ,    breath sounds clear to auscultation       Cardiovascular negative cardio ROS   Rhythm:Regular Rate:Normal     Neuro/Psych  Headaches,    GI/Hepatic Neg liver ROS, GERD  ,  Endo/Other  negative endocrine ROS  Renal/GU negative Renal ROS     Musculoskeletal   Abdominal   Peds  Hematology   Anesthesia Other Findings   Reproductive/Obstetrics                             Anesthesia Physical Anesthesia Plan  ASA: III  Anesthesia Plan: MAC   Post-op Pain Management:    Induction: Intravenous  PONV Risk Score and Plan: 2 and Ondansetron, Dexamethasone and Midazolam  Airway Management Planned: LMA and Simple Face Mask  Additional Equipment:   Intra-op Plan:   Post-operative Plan: Extubation in OR  Informed Consent:     Dental advisory given  Plan Discussed with: CRNA and Anesthesiologist  Anesthesia Plan Comments:        Anesthesia Quick Evaluation

## 2020-07-08 NOTE — Transfer of Care (Signed)
Immediate Anesthesia Transfer of Care Note  Patient: Lauren Leonard  Procedure(s) Performed: DILATATION AND SUCTION CURETTAGE UNDER ULTRASOUND GUIDANCE. (N/A Vagina ) OPERATIVE ULTRASOUND (N/A Vagina ) CHROMOSOME STUDIES (N/A Vagina )  Patient Location: PACU  Anesthesia Type:MAC  Level of Consciousness: awake, alert  and oriented  Airway & Oxygen Therapy: Patient Spontanous Breathing and Patient connected to nasal cannula oxygen  Post-op Assessment: Report given to RN  Post vital signs: Reviewed and stable  Last Vitals:  Vitals Value Taken Time  BP 115/62   Temp    Pulse 76   Resp 27 07/08/20 1523  SpO2 100   Vitals shown include unvalidated device data.  Last Pain:  Vitals:   07/08/20 1129  TempSrc: Oral         Complications: No complications documented.

## 2020-07-08 NOTE — Op Note (Signed)
Pre Op Dx:   Early pregnancy loss Post Op Dx:   Same as pre-op Procedure:  Dilation and Suction Curettage under Ultrasound guidance   Surgeon:  Dr. [name] Assistants:  None Anesthesia:  MAC   EBL:  50cc  IVF:  See anesthesia documentation UOP:  400cc via in and out catheter at conclusion of case   Drains: In and out catheter Specimen removed:  Products of conception - portion sent for chromosome studies and remainder sent to pathology Device(s) implanted: None Case Type:  Clean-contaminated Findings:  Gestational sac visualized on [place] with absence of fetal pole and no fetal heart rate. Complications: None Indications:  30 y.o. [id number] with early pregnancy loss diagnosed on ultrasound who desired surgical management. Description of each procedure:  After informed consent was obtained the patient was taken to the operating room in the dorsal supine position. Per patient request, transabdominal US was performed and confirmation of absence of fetal pole and absence of fetal heart rate was noted. After MAC was administered, the patient was placed in the dorsal lithotomy position and prepped and draped in the usual sterile fashion. A pre-operative time-out was completed.  The anterior lip of the cervix was grasped with a single-tooth tenaculum and the cervix was serially dilated to accommodate a 51m curved suction cannula. Multiple passes were required and all products were removed as confirmed by ultrasound. A banjo curette was used to curettage the endometrium. The single-tooth tenaculum was removed and its sites were made hemostatic using pressure and silver nitrate. Her bladder was drained.  Adequate hemostasis was noted.  The patient was awakened and appeared to have tolerated the procedure well.  All counts were correct.  Disposition:  PACU  MDrema Dallas DO

## 2020-07-08 NOTE — Anesthesia Postprocedure Evaluation (Signed)
Anesthesia Post Note  Patient: Lauren Leonard  Procedure(s) Performed: DILATATION AND SUCTION CURETTAGE UNDER ULTRASOUND GUIDANCE. (N/A Vagina ) OPERATIVE ULTRASOUND (N/A Vagina ) CHROMOSOME STUDIES (N/A Vagina )     Patient location during evaluation: PACU Anesthesia Type: MAC Level of consciousness: awake Pain management: pain level controlled Vital Signs Assessment: post-procedure vital signs reviewed and stable Respiratory status: spontaneous breathing Cardiovascular status: stable Postop Assessment: no apparent nausea or vomiting Anesthetic complications: no   No complications documented.  Last Vitals:  Vitals:   07/08/20 1129 07/08/20 1525  BP: 118/73 115/62  Pulse: 74   Resp: 17 19  Temp: 36.9 C   SpO2: 98% 100%    Last Pain:  Vitals:   07/08/20 1129  TempSrc: Oral                 Haile Bosler

## 2020-07-08 NOTE — Anesthesia Procedure Notes (Signed)
Procedure Name: MAC Date/Time: 07/08/2020 2:50 PM Performed by: Bonney Aid, CRNA Pre-anesthesia Checklist: Patient identified, Emergency Drugs available, Suction available, Patient being monitored and Timeout performed Patient Re-evaluated:Patient Re-evaluated prior to induction Placement Confirmation: positive ETCO2

## 2020-07-08 NOTE — Discharge Instructions (Signed)

## 2020-07-08 NOTE — Interval H&P Note (Signed)
History and Physical Interval Note:  07/08/2020 2:38 PM  Lauren Leonard  has presented today for surgery, with the diagnosis of Missed Abortion (O02.1).  The various methods of treatment have been discussed with the patient and family. After consideration of risks, benefits and other options for treatment, the patient has consented to  Procedure(s): DILATATION AND SUCTION CURETTAGE UNDER ULTRASOUND GUIDANCE. (N/A) OPERATIVE ULTRASOUND (N/A) CHROMOSOME STUDIES (N/A) as a surgical intervention.  The patient's history has been reviewed, patient examined, no change in status, stable for surgery.  I have reviewed the patient's chart and labs.  Questions were answered to the patient's satisfaction.     [name]

## 2020-07-09 ENCOUNTER — Encounter (HOSPITAL_BASED_OUTPATIENT_CLINIC_OR_DEPARTMENT_OTHER): Payer: Self-pay | Admitting: Obstetrics and Gynecology

## 2020-07-09 LAB — SURGICAL PATHOLOGY

## 2020-07-14 ENCOUNTER — Ambulatory Visit (HOSPITAL_BASED_OUTPATIENT_CLINIC_OR_DEPARTMENT_OTHER): Admit: 2020-07-14 | Payer: BC Managed Care – PPO | Admitting: Obstetrics and Gynecology

## 2020-07-14 ENCOUNTER — Encounter (HOSPITAL_BASED_OUTPATIENT_CLINIC_OR_DEPARTMENT_OTHER): Payer: Self-pay

## 2020-07-14 SURGERY — CONE BIOPSY, CERVIX
Anesthesia: Choice

## 2020-09-07 NOTE — H&P (Signed)
Lauren Leonard is an 30 y.o. 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  Patient desires future fertility. Original CKC procedure delayed due to previous miscarriage.  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 (Menstrual status: Other).    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   History of ovarian cyst    IBS (irritable bowel syndrome)    IBS (irritable bowel syndrome)    Migraines    07-01-2020  per pt started 2015 and found to have brain colloid cyst,  currently only occasional migraines takes excedrine migraine   Mild intermittent asthma    followed by pcp   Missed ab 06/2020    Past Surgical History:  Procedure Laterality Date   DILATION AND EVACUATION N/A 07/08/2020   Procedure: DILATATION AND SUCTION CURETTAGE UNDER ULTRASOUND GUIDANCE.;  Surgeon: [name], DO;  Location: [place];  Service: Gynecology;  Laterality: N/A;   LAPAROSCOPIC  CHOLECYSTECTOMY  06/28/12   OPERATIVE ULTRASOUND N/A 07/08/2020   Procedure: OPERATIVE ULTRASOUND;  Surgeon: [name], DO;  Location: [facility];  Service: Gynecology;  Laterality: N/A;    Family History  Problem Relation Age of Onset   Diabetes Maternal Grandmother    Hypertension Maternal Grandmother    Breast cancer Maternal Grandmother    Diabetes Maternal Grandfather    Pancreatic cancer Maternal Grandfather    Migraines Mother    Hypertension Mother    Asthma Mother    Seizures Mother    Thyroid disease Mother    Gout Father    Cerebral aneurysm Other     Social History:  reports that she has never smoked. She has never used smokeless tobacco. She reports current alcohol use. She reports that she does not use drugs.  ALLERGIES/MEDS:  Allergies:  Allergies  Allergen Reactions   Zoloft [Sertraline]     Makes anxious   Citric Acid Rash    Breakouts on chest and back.   Latex Rash and Other (See Comments)    Itches, breaks my hands out, burning    No medications prior to admission.     Review of Systems  Constitutional: Negative.   HENT: Negative.    Eyes: Negative.   Respiratory: Negative.    Cardiovascular: Negative.   Gastrointestinal: Negative.   Genitourinary: Negative.   Musculoskeletal: Negative.   Skin: Negative.   Neurological: Negative.   Endo/Heme/Allergies: Negative.   Psychiatric/Behavioral: Negative.     There were no vitals taken for  this visit. Gen:  NAD, pleasant and cooperative Cardio:  RRR Pulm:  CTAB, no wheezes/rales/rhonchi Abd:  Soft, non-distended, non-tender throughout, no rebound/guarding Ext:  No bilateral LE edema, no bilateral calf tenderness   No results found for this or any previous visit (from the past 24 hour(s)).  No results found.   ASSESSMENT/PLAN: Lauren Leonard is a 30 y.o. 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  -  Admit to [place] labs (CBC, T&S, COVID screen) - Diet:  NPO - IVF:  Per anesthesia - VTE Prophylaxis:  SCDs - Antibiotics: None indicated - D/C home same day  Consents: The nature of the CKC is to surgically destroy or remove the abnormal areas, and to send a piece of the tissue to the lab for analysis.  The procedure is diagnostic, and usually therapeutic.  This procedure holds risks of infection, bleeding requiring a blood transfusion, incomplete resolution of the abnormality requiring a future procedure or additional follow-up testing, inability to carry a pregnancy (cervical insufficiency), cervical stenosis (narrowing), blood clot (DVT or PE), and ultimately a risk of death.  Patient understands these risks and agrees to proceed with the above named procedure.  Patient was consented for blood products.  The patient is aware that bleeding may result in the need for a blood transfusion which includes risk of transmission of HIV (1:2 million), Hepatitis C (1:2 million), and Hepatitis B (1:200 thousand) and transfusion reaction.  Patient voiced understanding of the above risks as well as understanding of indications for blood transfusion.  [name], DO [phone] (office)

## 2020-09-21 ENCOUNTER — Encounter (HOSPITAL_BASED_OUTPATIENT_CLINIC_OR_DEPARTMENT_OTHER): Payer: Self-pay | Admitting: Obstetrics and Gynecology

## 2020-09-21 ENCOUNTER — Other Ambulatory Visit: Payer: Self-pay

## 2020-09-27 ENCOUNTER — Encounter (HOSPITAL_BASED_OUTPATIENT_CLINIC_OR_DEPARTMENT_OTHER)
Admission: RE | Admit: 2020-09-27 | Discharge: 2020-09-27 | Disposition: A | Discharge status: Home / Self Care | Payer: BC Managed Care – PPO | Source: Ambulatory Visit | Attending: Obstetrics and Gynecology | Admitting: Obstetrics and Gynecology

## 2020-09-27 DIAGNOSIS — J4520 Mild intermittent asthma, uncomplicated: Secondary | ICD-10-CM | POA: Diagnosis not present

## 2020-09-27 DIAGNOSIS — Z820 Family history of epilepsy and other diseases of the nervous system: Secondary | ICD-10-CM | POA: Diagnosis not present

## 2020-09-27 DIAGNOSIS — N871 Moderate cervical dysplasia: Secondary | ICD-10-CM | POA: Diagnosis present

## 2020-09-27 DIAGNOSIS — Z01812 Encounter for preprocedural laboratory examination: Secondary | ICD-10-CM | POA: Diagnosis not present

## 2020-09-27 DIAGNOSIS — Z91040 Latex allergy status: Secondary | ICD-10-CM | POA: Diagnosis not present

## 2020-09-27 DIAGNOSIS — Z8249 Family history of ischemic heart disease and other diseases of the circulatory system: Secondary | ICD-10-CM | POA: Diagnosis not present

## 2020-09-27 DIAGNOSIS — Z803 Family history of malignant neoplasm of breast: Secondary | ICD-10-CM | POA: Diagnosis not present

## 2020-09-27 DIAGNOSIS — Z800 Family history of malignant neoplasm of digestive organs: Secondary | ICD-10-CM | POA: Diagnosis not present

## 2020-09-27 DIAGNOSIS — Z8349 Family history of other endocrine, nutritional and metabolic diseases: Secondary | ICD-10-CM | POA: Diagnosis not present

## 2020-09-27 DIAGNOSIS — Z833 Family history of diabetes mellitus: Secondary | ICD-10-CM | POA: Diagnosis not present

## 2020-09-27 DIAGNOSIS — Z888 Allergy status to other drugs, medicaments and biological substances status: Secondary | ICD-10-CM | POA: Diagnosis not present

## 2020-09-27 LAB — CBC
HCT: 39.7 % (ref 36.0–46.0)
Hemoglobin: 12.9 g/dL (ref 12.0–15.0)
MCH: 30.4 pg (ref 26.0–34.0)
MCHC: 32.5 g/dL (ref 30.0–36.0)
MCV: 93.6 fL (ref 80.0–100.0)
Platelets: 243 10*3/uL (ref 150–400)
RBC: 4.24 MIL/uL (ref 3.87–5.11)
RDW: 12.2 % (ref 11.5–15.5)
WBC: 5.1 10*3/uL (ref 4.0–10.5)
nRBC: 0 % (ref 0.0–0.2)

## 2020-09-27 LAB — POCT PREGNANCY, URINE: Preg Test, Ur: NEGATIVE

## 2020-09-27 LAB — TYPE AND SCREEN
ABO/RH(D): O POS
Antibody Screen: NEGATIVE

## 2020-09-27 NOTE — Progress Notes (Signed)
Reminded pt to come in for lab work today.

## 2020-09-27 NOTE — Progress Notes (Signed)

## 2020-09-28 ENCOUNTER — Ambulatory Visit (HOSPITAL_BASED_OUTPATIENT_CLINIC_OR_DEPARTMENT_OTHER): Payer: BC Managed Care – PPO | Admitting: Anesthesiology

## 2020-09-28 ENCOUNTER — Ambulatory Visit (HOSPITAL_BASED_OUTPATIENT_CLINIC_OR_DEPARTMENT_OTHER)
Admission: RE | Admit: 2020-09-28 | Discharge: 2020-09-28 | Disposition: A | Discharge status: Home / Self Care | Payer: BC Managed Care – PPO | Attending: Obstetrics and Gynecology | Admitting: Obstetrics and Gynecology

## 2020-09-28 ENCOUNTER — Encounter (HOSPITAL_BASED_OUTPATIENT_CLINIC_OR_DEPARTMENT_OTHER): Payer: Self-pay | Admitting: Obstetrics and Gynecology

## 2020-09-28 ENCOUNTER — Encounter (HOSPITAL_BASED_OUTPATIENT_CLINIC_OR_DEPARTMENT_OTHER)
Admission: RE | Disposition: A | Discharge status: Home / Self Care | Payer: Self-pay | Source: Home / Self Care | Attending: Obstetrics and Gynecology

## 2020-09-28 ENCOUNTER — Other Ambulatory Visit: Payer: Self-pay

## 2020-09-28 DIAGNOSIS — Z8249 Family history of ischemic heart disease and other diseases of the circulatory system: Secondary | ICD-10-CM | POA: Insufficient documentation

## 2020-09-28 DIAGNOSIS — Z01812 Encounter for preprocedural laboratory examination: Secondary | ICD-10-CM | POA: Insufficient documentation

## 2020-09-28 DIAGNOSIS — Z888 Allergy status to other drugs, medicaments and biological substances status: Secondary | ICD-10-CM | POA: Insufficient documentation

## 2020-09-28 DIAGNOSIS — Z8349 Family history of other endocrine, nutritional and metabolic diseases: Secondary | ICD-10-CM | POA: Insufficient documentation

## 2020-09-28 DIAGNOSIS — N871 Moderate cervical dysplasia: Secondary | ICD-10-CM | POA: Diagnosis not present

## 2020-09-28 DIAGNOSIS — Z91040 Latex allergy status: Secondary | ICD-10-CM | POA: Insufficient documentation

## 2020-09-28 DIAGNOSIS — Z800 Family history of malignant neoplasm of digestive organs: Secondary | ICD-10-CM | POA: Insufficient documentation

## 2020-09-28 DIAGNOSIS — Z833 Family history of diabetes mellitus: Secondary | ICD-10-CM | POA: Insufficient documentation

## 2020-09-28 DIAGNOSIS — J4520 Mild intermittent asthma, uncomplicated: Secondary | ICD-10-CM | POA: Insufficient documentation

## 2020-09-28 DIAGNOSIS — Z803 Family history of malignant neoplasm of breast: Secondary | ICD-10-CM | POA: Insufficient documentation

## 2020-09-28 DIAGNOSIS — Z820 Family history of epilepsy and other diseases of the nervous system: Secondary | ICD-10-CM | POA: Insufficient documentation

## 2020-09-28 HISTORY — PX: CERVICAL CONIZATION W/BX: SHX1330

## 2020-09-28 SURGERY — CONE BIOPSY, CERVIX
Anesthesia: General | Site: Cervix

## 2020-09-28 MED ORDER — MEPERIDINE HCL 25 MG/ML IJ SOLN: 6.2500 mg | INTRAMUSCULAR | Status: DC | PRN

## 2020-09-28 MED ORDER — PROPOFOL 10 MG/ML IV BOLUS
INTRAVENOUS | Status: DC | PRN
  Administered 2020-09-28: 200 mg via INTRAVENOUS
  Administered 2020-09-28: 50 mg via INTRAVENOUS

## 2020-09-28 MED ORDER — FENTANYL CITRATE (PF) 100 MCG/2ML IJ SOLN
INTRAMUSCULAR | Status: AC
  Filled 2020-09-28: qty 2

## 2020-09-28 MED ORDER — LIDOCAINE-EPINEPHRINE 1 %-1:100000 IJ SOLN
INTRAMUSCULAR | Status: DC | PRN
  Administered 2020-09-28: 7.5 mL

## 2020-09-28 MED ORDER — OXYCODONE HCL 5 MG PO TABS: 5.0000 mg | ORAL_TABLET | Freq: Once | ORAL | Status: DC | PRN

## 2020-09-28 MED ORDER — FENTANYL CITRATE (PF) 100 MCG/2ML IJ SOLN
INTRAMUSCULAR | Status: DC | PRN
  Administered 2020-09-28: 100 ug via INTRAVENOUS

## 2020-09-28 MED ORDER — AMISULPRIDE (ANTIEMETIC) 5 MG/2ML IV SOLN: 10.0000 mg | Freq: Once | INTRAVENOUS | Status: DC | PRN

## 2020-09-28 MED ORDER — HYDROMORPHONE HCL 1 MG/ML IJ SOLN
INTRAMUSCULAR | Status: AC
  Filled 2020-09-28: qty 0.5

## 2020-09-28 MED ORDER — LIDOCAINE HCL (CARDIAC) PF 100 MG/5ML IV SOSY
PREFILLED_SYRINGE | INTRAVENOUS | Status: DC | PRN
  Administered 2020-09-28: 60 mg via INTRAVENOUS

## 2020-09-28 MED ORDER — OXYCODONE HCL 5 MG/5ML PO SOLN: 5.0000 mg | Freq: Once | ORAL | Status: DC | PRN

## 2020-09-28 MED ORDER — IBUPROFEN 800 MG PO TABS: 800.0000 mg | ORAL_TABLET | Freq: Three times a day (TID) | ORAL | 0 refills | Status: DC | PRN

## 2020-09-28 MED ORDER — PROMETHAZINE HCL 25 MG/ML IJ SOLN: 6.2500 mg | INTRAMUSCULAR | Status: DC | PRN

## 2020-09-28 MED ORDER — OXYCODONE HCL 5 MG PO TABS: 5.0000 mg | ORAL_TABLET | ORAL | 0 refills | Status: DC | PRN

## 2020-09-28 MED ORDER — HYDROMORPHONE HCL 1 MG/ML IJ SOLN
0.2500 mg | INTRAMUSCULAR | Status: DC | PRN
  Administered 2020-09-28: 0.5 mg via INTRAVENOUS

## 2020-09-28 MED ORDER — FERRIC SUBSULFATE SOLN
Status: DC | PRN
  Administered 2020-09-28: 1 via TOPICAL

## 2020-09-28 MED ORDER — ONDANSETRON HCL 4 MG/2ML IJ SOLN
INTRAMUSCULAR | Status: DC | PRN
  Administered 2020-09-28: 4 mg via INTRAVENOUS

## 2020-09-28 MED ORDER — DEXAMETHASONE SODIUM PHOSPHATE 4 MG/ML IJ SOLN
INTRAMUSCULAR | Status: DC | PRN
  Administered 2020-09-28: 10 mg via INTRAVENOUS

## 2020-09-28 MED ORDER — MIDAZOLAM HCL 5 MG/5ML IJ SOLN
INTRAMUSCULAR | Status: DC | PRN
  Administered 2020-09-28: 2 mg via INTRAVENOUS

## 2020-09-28 MED ORDER — LACTATED RINGERS IV SOLN: INTRAVENOUS | Status: DC

## 2020-09-28 MED ORDER — DROPERIDOL 2.5 MG/ML IJ SOLN
INTRAMUSCULAR | Status: DC | PRN
  Administered 2020-09-28: .625 mg via INTRAVENOUS

## 2020-09-28 MED ORDER — MIDAZOLAM HCL 2 MG/2ML IJ SOLN
INTRAMUSCULAR | Status: AC
  Filled 2020-09-28: qty 2

## 2020-09-28 SURGICAL SUPPLY — 27 items
BLADE SURG 11 STRL SS (BLADE) ×2 IMPLANT
CATH ROBINSON RED A/P 16FR (CATHETERS) IMPLANT
ELECT BALL LEEP 5MM RED (ELECTRODE) ×2 IMPLANT
ELECT BLADE 6.5 EXT (BLADE) ×2 IMPLANT
ELECT REM PT RETURN 9FT ADLT (ELECTROSURGICAL) ×2
ELECTRODE REM PT RTRN 9FT ADLT (ELECTROSURGICAL) ×1 IMPLANT
GAUZE 4X4 16PLY ~~LOC~~+RFID DBL (SPONGE) ×4 IMPLANT
GLOVE SURG ENC MOIS LTX SZ6.5 (GLOVE) IMPLANT
GLOVE SURG POLYISO LF SZ6.5 (GLOVE) ×4 IMPLANT
GLOVE SURG UNDER POLY LF SZ6.5 (GLOVE) ×2 IMPLANT
GLOVE SURG UNDER POLY LF SZ7 (GLOVE) ×4 IMPLANT
GOWN STRL REUS W/ TWL LRG LVL3 (GOWN DISPOSABLE) ×2 IMPLANT
GOWN STRL REUS W/TWL LRG LVL3 (GOWN DISPOSABLE) ×4
HEMOSTAT SURGICEL 2X14 (HEMOSTASIS) ×2 IMPLANT
NS IRRIG 1000ML POUR BTL (IV SOLUTION) ×2 IMPLANT
PACK VAGINAL MINOR WOMEN LF (CUSTOM PROCEDURE TRAY) ×2 IMPLANT
PAD OB MATERNITY 4.3X12.25 (PERSONAL CARE ITEMS) ×2 IMPLANT
PENCIL SMOKE EVACUATOR (MISCELLANEOUS) ×2 IMPLANT
SCOPETTES 8  STERILE (MISCELLANEOUS) ×2
SCOPETTES 8 STERILE (MISCELLANEOUS) ×1 IMPLANT
SPONGE SURGIFOAM ABS GEL 12-7 (HEMOSTASIS) IMPLANT
SUT SILK 2 0 SH (SUTURE) ×2 IMPLANT
SUT VIC AB 0 CT1 27 (SUTURE) ×6
SUT VIC AB 0 CT1 27XBRD ANBCTR (SUTURE) ×3 IMPLANT
TOWEL GREEN STERILE FF (TOWEL DISPOSABLE) ×4 IMPLANT
TUBE CONNECTING 20X1/4 (TUBING) ×2 IMPLANT
YANKAUER SUCT BULB TIP NO VENT (SUCTIONS) ×2 IMPLANT

## 2020-09-28 NOTE — Discharge Instructions (Signed)

## 2020-09-28 NOTE — Op Note (Signed)
Pre Op Dx:   High grade cervical dysplasia (CIN II) History of LEEP Post Op Dx:   Same as pre-operative diagnoses  Procedure:  Cold knife conization of the cervix  Surgeon:  Dr. [name] Assistants:  None Anesthesia:  General  EBL:  100cc  IVF:  1000cc UOP:  Voided prior to arrival to OR  Drains:  None Specimen removed:  Cervical cone biopsy - sent to pathology Device(s) implanted:  None Case Type:  Clean-contaminated Findings: Normal-appearing cervix Complications: None Indications:  30 y.o. yo patient with abnormal Pap, cervical biopsy showing CIN II and CIN I endocervical curettings. Disposition:  PACU  Description of Procedure:  After informed consent was obtained the patient was brought to the operating room.  She was placed in dorsal supine position and anesthesia was administered.  She was placed in dorsal lithotomy position and prepped and draped in the usual sterile fashion. A preoperative timeout was called.  Two 0-Vicryl sutures were placed at 3 and 9 o'clock on the cervix for traction and hemostasis. A circumferential cervical incision was initiated and tagged the specimen at 12 o'clock using 2-0 silks suture.  The cone biopsy was completed sharply with scissors and passed off the field.  The bed of the cone biopsy was made hemostatic with electrosurgery.  Monsel's was used on the cervical cone bed. A piece of Surgicel was then tied in place over the cone bed for additional hemostasis.  She was returned to dorsal supine position, awakened and extubated having appeared to tolerate the procedure well.  All counts were correct.  She was transferred to PACU in good condition.    [name], DO

## 2020-09-28 NOTE — Anesthesia Postprocedure Evaluation (Signed)
Anesthesia Post Note  Patient: Lauren Leonard  Procedure(s) Performed: COLD KNIFE CONIZATION OF CERVIX WITH BIOPSY (Cervix)     Patient location during evaluation: PACU Anesthesia Type: General Level of consciousness: awake and alert Pain management: pain level controlled Vital Signs Assessment: post-procedure vital signs reviewed and stable Respiratory status: spontaneous breathing, nonlabored ventilation and respiratory function stable Cardiovascular status: blood pressure returned to baseline and stable Postop Assessment: no apparent nausea or vomiting Anesthetic complications: no   No notable events documented.  Last Vitals:  Vitals:   09/28/20 1400 09/28/20 1415  BP: 136/79 106/78  Pulse: (!) 57 82  Resp: 18 16  Temp:  36.6 C  SpO2: 100% 99%    Last Pain:  Vitals:   09/28/20 1400  TempSrc:   PainSc: Asleep                 Lynda Rainwater

## 2020-09-28 NOTE — Anesthesia Procedure Notes (Signed)
Procedure Name: LMA Insertion Date/Time: 09/28/2020 12:34 PM Performed by: Willa Frater, CRNA Pre-anesthesia Checklist: Patient identified, Emergency Drugs available, Suction available and Patient being monitored Patient Re-evaluated:Patient Re-evaluated prior to induction Oxygen Delivery Method: Circle system utilized Preoxygenation: Pre-oxygenation with 100% oxygen Induction Type: IV induction Ventilation: Mask ventilation without difficulty LMA: LMA inserted LMA Size: 4.0 Number of attempts: 1 Airway Equipment and Method: Bite block Placement Confirmation: positive ETCO2 Tube secured with: Tape Dental Injury: Teeth and Oropharynx as per pre-operative assessment

## 2020-09-28 NOTE — Anesthesia Preprocedure Evaluation (Signed)
Anesthesia Evaluation  Patient identified by MRN, date of birth, ID band Patient awake    Reviewed: Allergy & Precautions, NPO status , Patient's Chart, lab work & pertinent test results  Airway Mallampati: II  TM Distance: >3 FB Neck ROM: Full    Dental no notable dental hx.    Pulmonary asthma ,    Pulmonary exam normal breath sounds clear to auscultation       Cardiovascular negative cardio ROS Normal cardiovascular exam Rhythm:Regular Rate:Normal     Neuro/Psych  Headaches,    GI/Hepatic Neg liver ROS, GERD  ,  Endo/Other  Morbid obesity  Renal/GU negative Renal ROS     Musculoskeletal   Abdominal (+) + obese,   Peds  Hematology   Anesthesia Other Findings   Reproductive/Obstetrics                             Anesthesia Physical  Anesthesia Plan  ASA: III  Anesthesia Plan: General   Post-op Pain Management:    Induction: Intravenous  PONV Risk Score and Plan: 3 and Ondansetron, Dexamethasone, Midazolam and Treatment may vary due to age or medical condition  Airway Management Planned: LMA  Additional Equipment:   Intra-op Plan:   Post-operative Plan: Extubation in OR  Informed Consent: I have reviewed the patients History and Physical, chart, labs and discussed the procedure including the risks, benefits and alternatives for the proposed anesthesia with the patient or authorized representative who has indicated his/her understanding and acceptance.     Dental advisory given  Plan Discussed with: CRNA and Anesthesiologist  Anesthesia Plan Comments:         Anesthesia Quick Evaluation

## 2020-09-28 NOTE — Interval H&P Note (Signed)
History and Physical Interval Note:  09/28/2020 11:58 AM  Lauren Leonard  has presented today for surgery, with the diagnosis of Cevical Intaepithelial Neoplasia II.  The various methods of treatment have been discussed with the patient and family. After consideration of risks, benefits and other options for treatment, the patient has consented to  Procedure(s): CONIZATION CERVIX WITH BIOPSY (N/A) as a surgical intervention.  The patient's history has been reviewed, patient examined, no change in status, stable for surgery.  I have reviewed the patient's chart and labs.  Questions were answered to the patient's satisfaction.     [name]

## 2020-09-28 NOTE — Transfer of Care (Signed)
Immediate Anesthesia Transfer of Care Note  Patient: Lauren Leonard  Procedure(s) Performed: CONIZATION CERVIX WITH BIOPSY (Cervix)  Patient Location: PACU  Anesthesia Type:General  Level of Consciousness: drowsy, patient cooperative and responds to stimulation  Airway & Oxygen Therapy: Patient Spontanous Breathing and Patient connected to face mask oxygen  Post-op Assessment: Report given to RN and Post -op Vital signs reviewed and stable  Post vital signs: Reviewed and stable  Last Vitals:  Vitals Value Taken Time  BP 146/83 09/28/20 1318  Temp    Pulse 98 09/28/20 1319  Resp 26 09/28/20 1319  SpO2 100 % 09/28/20 1319  Vitals shown include unvalidated device data.  Last Pain:  Vitals:   09/28/20 1115  TempSrc: Oral  PainSc: 0-No pain         Complications: No notable events documented.

## 2020-09-29 ENCOUNTER — Encounter (HOSPITAL_BASED_OUTPATIENT_CLINIC_OR_DEPARTMENT_OTHER): Payer: Self-pay | Admitting: Obstetrics and Gynecology

## 2020-10-01 LAB — SURGICAL PATHOLOGY

## 2021-03-20 NOTE — L&D Delivery Note (Signed)
Delivery Note Called for delivery. Patient was found to be complete and +2 station. Fetal head was delivered.  No nuchal was present. Shoulders and body followed without difficulty. Infant was placed on mother's chest, mouth and nose were bulb suctioned and let out a vigorous cry. Delayed cord clamping was performed for 60 seconds. Venous cord blood was collected. Placenta delivered spontaneously. Cervix, vagina, perineum and labia were inspected and a second degree perineal laceration was noted and repaired using 3-0 Vicryl. Remainder of cerclage was spontaneously expelled. Uterus fundus firm. Cervix inspected and cerclage site hemostatic. TXA 1g was given as prophylaxis. Placenta was inspected, found to be intact with a 3 vessel cord. Counts were correct.   At 10:35 AM a viable and healthy female was delivered via Vaginal, Spontaneous (Presentation: Left Occiput Anterior).  APGAR: 9, 10; weight: see infant's chart   Placenta status: Spontaneous, Intact.  Cord: 3 vessels with the following complications: None.  Cord pH: Not collected  Anesthesia: Epidural Episiotomy: None Lacerations:  Second degree perineal Suture Repair: 3.0 vicryl Est. Blood Loss (mL): 452  Mom to postpartum.  Baby to Couplet care / Skin to Skin.  [name] 11/08/2021, 10:54 AM

## 2021-04-06 LAB — OB RESULTS CONSOLE RUBELLA ANTIBODY, IGM: Rubella: IMMUNE

## 2021-04-06 LAB — OB RESULTS CONSOLE HIV ANTIBODY (ROUTINE TESTING): HIV: NONREACTIVE

## 2021-04-06 LAB — OB RESULTS CONSOLE HEPATITIS B SURFACE ANTIGEN: Hepatitis B Surface Ag: NEGATIVE

## 2021-04-12 LAB — OB RESULTS CONSOLE GC/CHLAMYDIA
Chlamydia: NEGATIVE
Neisseria Gonorrhea: NEGATIVE

## 2021-04-15 ENCOUNTER — Other Ambulatory Visit: Payer: Self-pay | Admitting: Obstetrics and Gynecology

## 2021-04-15 DIAGNOSIS — Z363 Encounter for antenatal screening for malformations: Secondary | ICD-10-CM

## 2021-05-25 ENCOUNTER — Encounter (HOSPITAL_COMMUNITY): Payer: Self-pay | Admitting: Obstetrics and Gynecology

## 2021-05-25 ENCOUNTER — Other Ambulatory Visit: Payer: Self-pay

## 2021-05-25 ENCOUNTER — Inpatient Hospital Stay (HOSPITAL_COMMUNITY)
Admission: AD | Admit: 2021-05-25 | Discharge: 2021-05-27 | DRG: 833 | Disposition: A | Discharge status: Home / Self Care | Payer: BC Managed Care – PPO | Attending: Obstetrics and Gynecology | Admitting: Obstetrics and Gynecology

## 2021-05-25 DIAGNOSIS — Z20822 Contact with and (suspected) exposure to covid-19: Secondary | ICD-10-CM | POA: Diagnosis present

## 2021-05-25 DIAGNOSIS — O210 Mild hyperemesis gravidarum: Secondary | ICD-10-CM | POA: Diagnosis not present

## 2021-05-25 DIAGNOSIS — Z3A14 14 weeks gestation of pregnancy: Secondary | ICD-10-CM

## 2021-05-25 DIAGNOSIS — O99612 Diseases of the digestive system complicating pregnancy, second trimester: Secondary | ICD-10-CM | POA: Diagnosis present

## 2021-05-25 DIAGNOSIS — O211 Hyperemesis gravidarum with metabolic disturbance: Secondary | ICD-10-CM | POA: Diagnosis not present

## 2021-05-25 DIAGNOSIS — R197 Diarrhea, unspecified: Secondary | ICD-10-CM | POA: Diagnosis present

## 2021-05-25 LAB — RESP PANEL BY RT-PCR (FLU A&B, COVID) ARPGX2
Influenza A by PCR: NEGATIVE
Influenza B by PCR: NEGATIVE
SARS Coronavirus 2 by RT PCR: NEGATIVE

## 2021-05-25 LAB — CBC WITH DIFFERENTIAL/PLATELET
Abs Immature Granulocytes: 0.02 10*3/uL (ref 0.00–0.07)
Basophils Absolute: 0 10*3/uL (ref 0.0–0.1)
Basophils Relative: 0 %
Eosinophils Absolute: 0 10*3/uL (ref 0.0–0.5)
Eosinophils Relative: 0 %
HCT: 40.8 % (ref 36.0–46.0)
Hemoglobin: 13.5 g/dL (ref 12.0–15.0)
Immature Granulocytes: 0 %
Lymphocytes Relative: 7 %
Lymphs Abs: 0.4 10*3/uL — ABNORMAL LOW (ref 0.7–4.0)
MCH: 30.2 pg (ref 26.0–34.0)
MCHC: 33.1 g/dL (ref 30.0–36.0)
MCV: 91.3 fL (ref 80.0–100.0)
Monocytes Absolute: 0.4 10*3/uL (ref 0.1–1.0)
Monocytes Relative: 7 %
Neutro Abs: 4.7 10*3/uL (ref 1.7–7.7)
Neutrophils Relative %: 86 %
Platelets: 255 10*3/uL (ref 150–400)
RBC: 4.47 MIL/uL (ref 3.87–5.11)
RDW: 11.9 % (ref 11.5–15.5)
WBC: 5.5 10*3/uL (ref 4.0–10.5)
nRBC: 0 % (ref 0.0–0.2)

## 2021-05-25 LAB — COMPREHENSIVE METABOLIC PANEL
ALT: 23 U/L (ref 0–44)
AST: 21 U/L (ref 15–41)
Albumin: 3.6 g/dL (ref 3.5–5.0)
Alkaline Phosphatase: 58 U/L (ref 38–126)
Anion gap: 11 (ref 5–15)
BUN: 5 mg/dL — ABNORMAL LOW (ref 6–20)
CO2: 20 mmol/L — ABNORMAL LOW (ref 22–32)
Calcium: 8.9 mg/dL (ref 8.9–10.3)
Chloride: 105 mmol/L (ref 98–111)
Creatinine, Ser: 0.7 mg/dL (ref 0.44–1.00)
GFR, Estimated: >60 mL/min (ref >60)
Glucose, Bld: 96 mg/dL (ref 70–99)
Potassium: 3.6 mmol/L (ref 3.5–5.1)
Sodium: 136 mmol/L (ref 135–145)
Total Bilirubin: 1 mg/dL (ref 0.3–1.2)
Total Protein: 7 g/dL (ref 6.5–8.1)

## 2021-05-25 LAB — LIPASE, BLOOD: Lipase: 30 U/L (ref 11–51)

## 2021-05-25 MED ORDER — HYDROXYZINE HCL 50 MG/ML IM SOLN
50.0000 mg | Freq: Four times a day (QID) | INTRAMUSCULAR | Status: DC | PRN
  Filled 2021-05-25: qty 1

## 2021-05-25 MED ORDER — CALCIUM CARBONATE ANTACID 500 MG PO CHEW: 2.0000 | CHEWABLE_TABLET | ORAL | Status: DC | PRN

## 2021-05-25 MED ORDER — DICYCLOMINE HCL 10 MG/ML IM SOLN
20.0000 mg | Freq: Once | INTRAMUSCULAR | Status: AC
  Administered 2021-05-25: 20 mg via INTRAMUSCULAR
  Filled 2021-05-25: qty 2

## 2021-05-25 MED ORDER — FAMOTIDINE IN NACL 20-0.9 MG/50ML-% IV SOLN
20.0000 mg | Freq: Two times a day (BID) | INTRAVENOUS | Status: DC
  Administered 2021-05-26 (×2): 20 mg via INTRAVENOUS
  Filled 2021-05-25 (×2): qty 50

## 2021-05-25 MED ORDER — ACETAMINOPHEN 500 MG PO TABS: 1000.0000 mg | ORAL_TABLET | Freq: Three times a day (TID) | ORAL | Status: DC | PRN

## 2021-05-25 MED ORDER — FAMOTIDINE 20 MG PO TABS
20.0000 mg | ORAL_TABLET | Freq: Two times a day (BID) | ORAL | Status: DC
  Administered 2021-05-25: 20 mg via ORAL
  Filled 2021-05-25 (×2): qty 1

## 2021-05-25 MED ORDER — GLYCOPYRROLATE 1 MG PO TABS
1.0000 mg | ORAL_TABLET | Freq: Three times a day (TID) | ORAL | Status: DC
  Administered 2021-05-25 – 2021-05-26 (×3): 1 mg via ORAL
  Filled 2021-05-25 (×4): qty 1

## 2021-05-25 MED ORDER — ONDANSETRON 4 MG PO TBDP: 4.0000 mg | ORAL_TABLET | Freq: Three times a day (TID) | ORAL | Status: DC | PRN

## 2021-05-25 MED ORDER — ONDANSETRON HCL 4 MG/2ML IJ SOLN
4.0000 mg | Freq: Three times a day (TID) | INTRAMUSCULAR | Status: DC | PRN
  Administered 2021-05-25 – 2021-05-26 (×2): 4 mg via INTRAVENOUS
  Filled 2021-05-25 (×3): qty 2

## 2021-05-25 MED ORDER — LACTATED RINGERS IV SOLN: INTRAVENOUS | Status: DC

## 2021-05-25 MED ORDER — PROMETHAZINE HCL 25 MG RE SUPP
12.5000 mg | RECTAL | Status: DC | PRN
  Filled 2021-05-25: qty 1

## 2021-05-25 MED ORDER — HYDROXYZINE HCL 50 MG PO TABS: 50.0000 mg | ORAL_TABLET | Freq: Four times a day (QID) | ORAL | Status: DC | PRN

## 2021-05-25 MED ORDER — LACTATED RINGERS IV BOLUS
1000.0000 mL | Freq: Once | INTRAVENOUS | Status: AC
  Administered 2021-05-25: 1000 mL via INTRAVENOUS

## 2021-05-25 MED ORDER — PROMETHAZINE HCL 25 MG PO TABS
12.5000 mg | ORAL_TABLET | ORAL | Status: DC | PRN
  Administered 2021-05-25 – 2021-05-26 (×2): 25 mg via ORAL
  Filled 2021-05-25 (×2): qty 1

## 2021-05-25 MED ORDER — SODIUM CHLORIDE 0.9 % IV SOLN
8.0000 mg | Freq: Three times a day (TID) | INTRAVENOUS | Status: DC | PRN
  Filled 2021-05-25: qty 4

## 2021-05-25 MED ORDER — SODIUM CHLORIDE 0.9 % IV SOLN
25.0000 mg | Freq: Once | INTRAVENOUS | Status: AC
  Administered 2021-05-25: 25 mg via INTRAVENOUS
  Filled 2021-05-25: qty 1

## 2021-05-25 NOTE — H&P (Signed)
Lauren Leonard is a 31 y.o. female presenting complaining of nausea and vomiting. She reports sharp pain at the top of her stomach. She threw up dinner last night and everything she tried to eat today.  She has diarrhea that started at 3 am this morning. She reports at least 15 stools since three am. She denies recent antibiotic use.  ?She has tried zofran  4 mg odt ( last dose yesterday at 8 pm)  she also has a scopolamine patch. She has received 1 LR  since arriving to [facility]. She also received Phenergan IV. She still has upper abdominal pain. She denies current nausea.  ? ?  ?OB History   ? ? Gravida  ?3  ? Para  ?1  ? Term  ?1  ? Preterm  ?   ? AB  ?1  ? Living  ?1  ?  ? ? SAB  ?1  ? IAB  ?   ? Ectopic  ?   ? Multiple  ?0  ? Live Births  ?1  ?   ?  ?  ? ?Past Medical History:  ?Diagnosis Date  ? Chronic rhinitis   ? 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 left foramen of menro dx 2015 due to having migraines (previously followed by neurology,  dr v. Leta Baptist, lov note in epic 05-03-2016, last MRI 05-31-2016 stable)  ? GERD (gastroesophageal reflux disease)   ? History of cervical dysplasia   ? 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 History of ovarian cyst   ? IBS (irritable bowel syndrome)   ? IBS (irritable bowel syndrome)   ? Migraines   ? 07-01-2020  per pt started 2015 and found to have brain colloid cyst,  currently only occasional migraines takes excedrine migraine  ? Mild intermittent asthma   ? followed by pcp  ? Missed ab 06/2020  ? ?Past Surgical History:  ?Procedure Laterality Date  ? CERVICAL CONIZATION W/BX N/A 09/28/2020  ? Procedure: COLD KNIFE CONIZATION OF CERVIX WITH BIOPSY;  Surgeon: [name], DO;  Location: [facility];  Service: Gynecology;  Laterality: N/A;  ? DILATION AND EVACUATION N/A 07/08/2020  ? Procedure: DILATATION AND SUCTION CURETTAGE UNDER ULTRASOUND GUIDANCE.;  Surgeon: [name], DO;  Location: [place];  Service: Gynecology;   Laterality: N/A;  ? LAPAROSCOPIC CHOLECYSTECTOMY  06/28/12  ? OPERATIVE ULTRASOUND N/A 07/08/2020  ? Procedure: OPERATIVE ULTRASOUND;  Surgeon: [name], DO;  Location: [place];  Service: Gynecology;  Laterality: N/A;  ? ?Family History: family history includes Asthma in her mother; Breast cancer in her maternal grandmother; Cerebral aneurysm in an other family member; Diabetes in her maternal grandfather and maternal grandmother; Gout in her father; Hypertension in her maternal grandmother and mother; Migraines in her mother; Pancreatic cancer in her maternal grandfather; Seizures in her mother; Thyroid disease in her mother. ?Social History:  reports that she has never smoked. She has never used smokeless tobacco. She reports current alcohol use. She reports that she does not use drugs. ? ? ? ?Review of Systems  ?Constitutional: Negative.   ?HENT: Negative.    ?Eyes: Negative.   ?Respiratory: Negative.    ?Cardiovascular: Negative.   ?Gastrointestinal:  Positive for abdominal pain, diarrhea, nausea and vomiting.  ?Endocrine: Negative.   ?Genitourinary: Negative.   ?Musculoskeletal: Negative.   ?Allergic/Immunologic: Negative.   ?Neurological: Negative.   ?Hematological: Negative.   ?Psychiatric/Behavioral: Negative.    ?  Maternal Medical History:  ?Reason for admission: Nausea.  ? ? ?  ?Blood pressure 107/64, pulse 99, temperature 98.1 ?F (36.7 ?C), temperature source Oral, resp. rate 16, height '5\' 6"'$  (1.676 m), weight 106.9 kg, last menstrual period 09/07/2020, SpO2 98 %. ?Maternal Exam:  ?Abdomen: Patient reports the following abdominal tenderness: Epigastric, LLQ and RLQ.  ? ?Physical Exam ?Vitals reviewed.  ?Constitutional:   ?   Appearance: She is well-developed.  ?HENT:  ?   Head: Normocephalic.  ?Eyes:  ?   Extraocular Movements: Extraocular movements intact.  ?Cardiovascular:  ?   Rate and Rhythm: Normal rate.  ?Pulmonary:  ?   Effort: Pulmonary effort is normal.  ?Abdominal:  ?    General: There is no distension.  ?   Palpations: Abdomen is soft.  ?   Tenderness: There is abdominal tenderness in the right lower quadrant, epigastric area and left lower quadrant.  ?Skin: ?   General: Skin is warm and dry.  ?Neurological:  ?   General: No focal deficit present.  ?   Mental Status: She is alert.  ?Psychiatric:     ?   Mood and Affect: Mood normal.     ?   Behavior: Behavior normal.  ?  ?Prenatal labs: ?ABO, Rh: --/--/O POS (07/11 1324) ?Antibody: NEG (07/11 1324) ?Rubella:  Immune   ?RPR:   Nonreactive  ?HBsAg:   Negative  ?HIV:   Negative  ?GBS:    ? ?Assessment/Plan: ?14 weeks and 5 days with hyperemesis gravidum 20 lb weight loss since the beginning of pregnancy. / Diarrhea ?- Admit for IV fluids and IV antiemetics ,if no improvement will consider steroid taper ?- will rule out Cdiff given 15 episodes of diarrhea in less than 24 hours.  ?-Clear liquids and advance diet as tolerated.   ? ?Christophe Louis ?05/25/2021, 6:12 PM ? ? ? ? ?

## 2021-05-25 NOTE — MAU Provider Note (Signed)
History     CSN: [id number]  Arrival date and time: 05/25/21 1500   Event Date/Time   First Provider Initiated Contact with Patient 05/25/21 1721      Chief Complaint  Patient presents with   Nausea   Diarrhea   Abdominal Pain   HPI Lauren Leonard is a 31 y.o. G3P1011 at 33w5dwho presents to MAU with chief complaint of vomiting. This is a recurrent problem. Current episode began last night. Patient states she had a "sausage dog" for lunch. She tolerated that without difficulty but she was unable to tolerate anything PO for dinner.  Patient endorses about 30 lb weight loss since the start of her pregnancy. She is managing her symptoms with ginger tea, dramamine, B6, Scopolamine patches, and Zofran ODT.   Patient also endorses random "sharp" pain across her abdomen. Pain occurs with movement, resolves at rest. She denies abdominal tenderness, vaginal bleeding, fever.  Patient also c/o diarrhea, new onset overnight. She has not taken medication or tried other treatments for this complaint.  General diet recall includes sausage dog, ramen noodles, ginger tea, raw carrots and broccoli.  Patient receives care with [facility].  OB History     Gravida  3   Para  1   Term  1   Preterm      AB  1   Living  1      SAB  1   IAB      Ectopic      Multiple  0   Live Births  1           Past Medical History:  Diagnosis Date   Chronic rhinitis    Colloid cyst of brain ([facility])    left foramen of menro dx 2015 due to having migraines (previously followed by neurology,  dr v. 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   History of ovarian cyst    IBS (irritable bowel syndrome)    IBS (irritable bowel syndrome)    Migraines    07-01-2020  per pt started 2015 and found to have brain colloid cyst,  currently only  occasional migraines takes excedrine migraine   Mild intermittent asthma    followed by pcp   Missed ab 06/2020    Past Surgical History:  Procedure Laterality Date   CERVICAL CONIZATION W/BX N/A 09/28/2020   Procedure: COLD KNIFE CONIZATION OF CERVIX WITH BIOPSY;  Surgeon: DDrema Dallas DO;  Location[place]  Service: Gynecology;  Laterality: N/A;   DILATION AND EVACUATION N/A 07/08/2020   Procedure: DILATATION AND SUCTION CURETTAGE UNDER ULTRASOUND GUIDANCE.;  Surgeon: DDrema Dallas DO;  Location[place] [facility]  Service: Gynecology;  Laterality: N/A;   LAPAROSCOPIC CHOLECYSTECTOMY  06/28/12   OPERATIVE ULTRASOUND N/A 07/08/2020   Procedure: OPERATIVE ULTRASOUND;  Surgeon: DDrema Dallas DO;  [place]  Service: Gynecology;  Laterality: N/A;    Family History  Problem Relation Age of Onset   Diabetes Maternal Grandmother    Hypertension Maternal Grandmother    Breast cancer Maternal Grandmother    Diabetes Maternal Grandfather    Pancreatic cancer Maternal Grandfather    Migraines Mother    Hypertension Mother    Asthma Mother    Seizures Mother    Thyroid disease Mother  Gout Father    Cerebral aneurysm Other     Social History   Tobacco Use   Smoking status: Never   Smokeless tobacco: Never  Vaping Use   Vaping Use: Never used  Substance Use Topics   Alcohol use: Yes    Comment: rarely    Drug use: Never    Allergies:  Allergies  Allergen Reactions   Zoloft [Sertraline]     Makes anxious   Citric Acid Rash    Breakouts on chest and back.   Latex Rash and Other (See Comments)    Itches, breaks my hands out, burning    Medications Prior to Admission  Medication Sig Dispense Refill Last Dose   albuterol (VENTOLIN HFA) 108 (90 Base) MCG/ACT inhaler Inhale 2 puffs into the lungs every 6 (six) hours as needed for wheezing or shortness of breath. 6.7 g 1    aspirin-acetaminophen-caffeine  (EXCEDRIN MIGRAINE) 250-250-65 MG tablet Take by mouth every 6 (six) hours as needed for headache.      azelastine (ASTELIN) 0.1 % nasal spray Place 1 spray into both nostrils 2 (two) times daily. Use in each nostril as directed      desloratadine (CLARINEX) 5 MG tablet Take 5 mg by mouth daily.      diphenhydrAMINE (BENADRYL) 25 MG tablet Take 25 mg by mouth at bedtime as needed.      fexofenadine (ALLEGRA) 180 MG tablet Take 180 mg by mouth daily.      ibuprofen (ADVIL) 800 MG tablet Take 1 tablet (800 mg total) by mouth every 8 (eight) hours as needed. 30 tablet 0    montelukast (SINGULAIR) 10 MG tablet Take by mouth at bedtime.      oxyCODONE (ROXICODONE) 5 MG immediate release tablet Take 1 tablet (5 mg total) by mouth every 4 (four) hours as needed for severe pain. 12 tablet 0    pantoprazole (PROTONIX) 40 MG tablet Take 40 mg by mouth daily.      Prenatal Vit-Fe Fumarate-FA (MULTIVITAMIN-PRENATAL) 27-0.8 MG TABS tablet Take 1 tablet by mouth daily at 12 noon.       Review of Systems  Constitutional:  Positive for fatigue.  Gastrointestinal:  Positive for abdominal pain, diarrhea, nausea and vomiting.  All other systems reviewed and are negative. Physical Exam   Blood pressure 107/64, pulse 99, temperature 98.1 F (36.7 C), temperature source Oral, resp. rate 16, height '5\' 6"'$  (1.676 m), weight 106.9 kg, last menstrual period 09/07/2020, SpO2 98 %.  Physical Exam Vitals and nursing note reviewed. Exam conducted with a chaperone present.  Constitutional:      Appearance: She is well-developed. She is ill-appearing.  Cardiovascular:     Rate and Rhythm: Normal rate and regular rhythm.     Heart sounds: Normal heart sounds.  Pulmonary:     Effort: Pulmonary effort is normal.     Breath sounds: Normal breath sounds.  Abdominal:     General: Bowel sounds are normal.     Palpations: Abdomen is soft.     Tenderness: There is no abdominal tenderness.  Neurological:     Mental Status:  She is alert.    MAU Course  Procedures  MDM  Orders Placed This Encounter  Procedures   Urinalysis, Routine w reflex microscopic Urine, Clean Catch   CBC with Differential/Platelet   Comprehensive metabolic panel   Lipase, blood   Blood draw with IV start   Results for orders placed or performed during the hospital encounter of 05/25/21 (  from the past 24 hour(s))  CBC with Differential/Platelet     Status: Abnormal   Collection Time: 05/25/21  4:12 PM  Result Value Ref Range   WBC 5.5 4.0 - 10.5 K/uL   RBC 4.47 3.87 - 5.11 MIL/uL   Hemoglobin 13.5 12.0 - 15.0 g/dL   HCT 40.8 36.0 - 46.0 %   MCV 91.3 80.0 - 100.0 fL   MCH 30.2 26.0 - 34.0 pg   MCHC 33.1 30.0 - 36.0 g/dL   RDW 11.9 11.5 - 15.5 %   Platelets 255 150 - 400 K/uL   nRBC 0.0 0.0 - 0.2 %   Neutrophils Relative % 86 %   Neutro Abs 4.7 1.7 - 7.7 K/uL   Lymphocytes Relative 7 %   Lymphs Abs 0.4 (L) 0.7 - 4.0 K/uL   Monocytes Relative 7 %   Monocytes Absolute 0.4 0.1 - 1.0 K/uL   Eosinophils Relative 0 %   Eosinophils Absolute 0.0 0.0 - 0.5 K/uL   Basophils Relative 0 %   Basophils Absolute 0.0 0.0 - 0.1 K/uL   Immature Granulocytes 0 %   Abs Immature Granulocytes 0.02 0.00 - 0.07 K/uL  Comprehensive metabolic panel     Status: Abnormal   Collection Time: 05/25/21  4:12 PM  Result Value Ref Range   Sodium 136 135 - 145 mmol/L   Potassium 3.6 3.5 - 5.1 mmol/L   Chloride 105 98 - 111 mmol/L   CO2 20 (L) 22 - 32 mmol/L   Glucose, Bld 96 70 - 99 mg/dL   BUN 5 (L) 6 - 20 mg/dL   Creatinine, Ser 0.70 0.44 - 1.00 mg/dL   Calcium 8.9 8.9 - 10.3 mg/dL   Total Protein 7.0 6.5 - 8.1 g/dL   Albumin 3.6 3.5 - 5.0 g/dL   AST 21 15 - 41 U/L   ALT 23 0 - 44 U/L   Alkaline Phosphatase 58 38 - 126 U/L   Total Bilirubin 1.0 0.3 - 1.2 mg/dL   GFR, Estimated >60 >60 mL/min   Anion gap 11 5 - 15  Lipase, blood     Status: None   Collection Time: 05/25/21  4:12 PM  Result Value Ref Range   Lipase 30 11 - 51 U/L   Meds  ordered this encounter  Medications   lactated ringers bolus 1,000 mL   dicyclomine (BENTYL) injection 20 mg   promethazine (PHENERGAN) 25 mg in sodium chloride 0.9 % 50 mL IVPB   lactated ringers infusion   Assessment and Plan  --31 y.o. G3P1011 at [redacted]w[redacted]d --FHT 145 by BSUS --No episodes on vomiting  --Hyperemesis with about 19 lb weight loss --Per patient request, Dr CLandry Mellowcontacted to inquire about admission --[facility] 05/25/2021, 6:58 PM

## 2021-05-25 NOTE — MAU Note (Signed)
Lauren Leonard is a 31 y.o. at 31w5dhere in MAU reporting: this morning started having vomiting and diarrhea. States it is coming constantly. Having upper abdominal pain. ? ?Onset of complaint: this morning ? ?Pain score: 8/10 ? ?Vitals:  ? 05/25/21 1525  ?BP: 107/64  ?Pulse: 99  ?Resp: 16  ?Temp: 98.1 ?F (36.7 ?C)  ?SpO2: 98%  ?   ?[id number] pt visibly uncomfortable ? ?Lab orders placed from triage: UA, unable to void ? ?

## 2021-05-26 LAB — URINALYSIS, ROUTINE W REFLEX MICROSCOPIC
Bilirubin Urine: NEGATIVE
Bilirubin Urine: NEGATIVE
Glucose, UA: NEGATIVE mg/dL
Glucose, UA: NEGATIVE mg/dL
Hgb urine dipstick: NEGATIVE
Hgb urine dipstick: NEGATIVE
Ketones, ur: 80 mg/dL — AB
Ketones, ur: NEGATIVE mg/dL
Leukocytes,Ua: NEGATIVE
Leukocytes,Ua: NEGATIVE
Nitrite: NEGATIVE
Nitrite: NEGATIVE
Protein, ur: NEGATIVE mg/dL
Protein, ur: NEGATIVE mg/dL
Specific Gravity, Urine: 1.025 (ref 1.005–1.030)
Specific Gravity, Urine: 1.004 — ABNORMAL LOW (ref 1.005–1.030)
pH: 6 (ref 5.0–8.0)
pH: 8 (ref 5.0–8.0)

## 2021-05-26 LAB — BASIC METABOLIC PANEL
Anion gap: 8 (ref 5–15)
BUN: <5 mg/dL — ABNORMAL LOW (ref 6–20)
CO2: 20 mmol/L — ABNORMAL LOW (ref 22–32)
Calcium: 8.1 mg/dL — ABNORMAL LOW (ref 8.9–10.3)
Chloride: 107 mmol/L (ref 98–111)
Creatinine, Ser: 0.72 mg/dL (ref 0.44–1.00)
GFR, Estimated: >60 mL/min (ref >60)
Glucose, Bld: 84 mg/dL (ref 70–99)
Potassium: 3 mmol/L — ABNORMAL LOW (ref 3.5–5.1)
Sodium: 135 mmol/L (ref 135–145)

## 2021-05-26 LAB — C DIFFICILE QUICK SCREEN W PCR REFLEX
C Diff antigen: NEGATIVE
C Diff interpretation: NOT DETECTED
C Diff toxin: NEGATIVE

## 2021-05-26 MED ORDER — POTASSIUM CHLORIDE CRYS ER 20 MEQ PO TBCR
20.0000 meq | EXTENDED_RELEASE_TABLET | Freq: Two times a day (BID) | ORAL | Status: DC
  Administered 2021-05-26: 20:00:00 20 meq via ORAL
  Filled 2021-05-26: qty 1

## 2021-05-26 MED ORDER — LACTATED RINGERS IV BOLUS
1000.0000 mL | Freq: Once | INTRAVENOUS | Status: AC
  Administered 2021-05-26: 06:00:00 1000 mL via INTRAVENOUS

## 2021-05-26 NOTE — Progress Notes (Signed)
Carolyn I Nyra Capes is a 31 y.o. female  at 14 weeks and 6 days admitted with hyperemesis gravidum and new onset diarrhea.  ? ?Subjective:  she reports feeling better this AM . She has not had a bowel movement since yesterday evening. Her epigastric abdominal pain has resolved. Less nausea but does not feel hungry.  ? ?Vitals:  ? 05/26/21 0545 05/26/21 0804  ?BP: (!) 97/45 (!) 100/49  ?Pulse: 78 84  ?Resp:  18  ?Temp:  98 ?F (36.7 ?C)  ?SpO2:  97%  ?  ? ?General : Alert and oriented no acute distress  ?Lungs no distress  ?Abdomen.. soft nontender nondistended no rebound and no guarding  ?Extremeties .. no evidence of DVT  ?FHT 140's  ? ?Results for orders placed or performed during the hospital encounter of 05/25/21 (from the past 24 hour(s))  ?CBC with Differential/Platelet     Status: Abnormal  ? Collection Time: 05/25/21  4:12 PM  ?Result Value Ref Range  ? WBC 5.5 4.0 - 10.5 K/uL  ? RBC 4.47 3.87 - 5.11 MIL/uL  ? Hemoglobin 13.5 12.0 - 15.0 g/dL  ? HCT 40.8 36.0 - 46.0 %  ? MCV 91.3 80.0 - 100.0 fL  ? MCH 30.2 26.0 - 34.0 pg  ? MCHC 33.1 30.0 - 36.0 g/dL  ? RDW 11.9 11.5 - 15.5 %  ? Platelets 255 150 - 400 K/uL  ? nRBC 0.0 0.0 - 0.2 %  ? Neutrophils Relative % 86 %  ? Neutro Abs 4.7 1.7 - 7.7 K/uL  ? Lymphocytes Relative 7 %  ? Lymphs Abs 0.4 (L) 0.7 - 4.0 K/uL  ? Monocytes Relative 7 %  ? Monocytes Absolute 0.4 0.1 - 1.0 K/uL  ? Eosinophils Relative 0 %  ? Eosinophils Absolute 0.0 0.0 - 0.5 K/uL  ? Basophils Relative 0 %  ? Basophils Absolute 0.0 0.0 - 0.1 K/uL  ? Immature Granulocytes 0 %  ? Abs Immature Granulocytes 0.02 0.00 - 0.07 K/uL  ?Comprehensive metabolic panel     Status: Abnormal  ? Collection Time: 05/25/21  4:12 PM  ?Result Value Ref Range  ? Sodium 136 135 - 145 mmol/L  ? Potassium 3.6 3.5 - 5.1 mmol/L  ? Chloride 105 98 - 111 mmol/L  ? CO2 20 (L) 22 - 32 mmol/L  ? Glucose, Bld 96 70 - 99 mg/dL  ? BUN 5 (L) 6 - 20 mg/dL  ? Creatinine, Ser 0.70 0.44 - 1.00 mg/dL  ? Calcium 8.9 8.9 - 10.3 mg/dL   ? Total Protein 7.0 6.5 - 8.1 g/dL  ? Albumin 3.6 3.5 - 5.0 g/dL  ? AST 21 15 - 41 U/L  ? ALT 23 0 - 44 U/L  ? Alkaline Phosphatase 58 38 - 126 U/L  ? Total Bilirubin 1.0 0.3 - 1.2 mg/dL  ? GFR, Estimated >60 >60 mL/min  ? Anion gap 11 5 - 15  ?Lipase, blood     Status: None  ? Collection Time: 05/25/21  4:12 PM  ?Result Value Ref Range  ? Lipase 30 11 - 51 U/L  ?Resp Panel by RT-PCR (Flu A&B, Covid) Nasopharyngeal Swab     Status: None  ? Collection Time: 05/25/21  5:55 PM  ? Specimen: Nasopharyngeal Swab; Nasopharyngeal(NP) swabs in vial transport medium  ?Result Value Ref Range  ? SARS Coronavirus 2 by RT PCR NEGATIVE NEGATIVE  ? Influenza A by PCR NEGATIVE NEGATIVE  ? Influenza B by PCR NEGATIVE NEGATIVE  ?  ?  A/P 14 weeks and 6 days with hyperemesis gravidum and diarrhea/  ?- improving  ?- continue antiemetics and IV fluids until tolerating po. Will advance to regular diet  ?- po challenge today  ?- still need stool sample for c diff although suspicion is low given diarrhea has improved.  ?- Possible dc home tomorrow if tolerates po today.  ? ? ? ? ? ?

## 2021-05-26 NOTE — Plan of Care (Signed)

## 2021-05-27 ENCOUNTER — Encounter (HOSPITAL_COMMUNITY): Payer: Self-pay | Admitting: Obstetrics and Gynecology

## 2021-05-27 MED ORDER — PROMETHAZINE HCL 25 MG PO TABS: 25.0000 mg | ORAL_TABLET | Freq: Four times a day (QID) | ORAL | 3 refills | Status: DC | PRN

## 2021-05-27 MED ORDER — POTASSIUM CHLORIDE CRYS ER 20 MEQ PO TBCR: 20.0000 meq | EXTENDED_RELEASE_TABLET | Freq: Two times a day (BID) | ORAL | 0 refills | Status: DC

## 2021-05-27 MED ORDER — ONDANSETRON 8 MG PO TBDP: 8.0000 mg | ORAL_TABLET | Freq: Three times a day (TID) | ORAL | 3 refills | Status: DC | PRN

## 2021-05-27 NOTE — Progress Notes (Signed)
Antepartum Progress Note ? ?Patient feeling much better today and ready to go home. Able to eat a biscuit without vomiting. Takes Zofran '4mg'$  at home. Denies fevers, chills, chest pain, visual changes, SOB, RUQ/epigastric pain, N/V, dysuria, hematuria, or sudden onset/worsening bilateral LE or facial edema. ? ?BP (!) 98/48 (BP Location: Left Arm)   Pulse 83   Temp 98.3 ?F (36.8 ?C) (Oral)   Resp 16   Ht '5\' 6"'$  (1.676 m)   Wt 108.4 kg   LMP 09/07/2020 (Exact Date)   SpO2 100%   BMI 38.58 kg/m?  ?Gen: NAD, pleasant ?Cardio:  RRR ?Lungs: CTAB, no wheezes/rales/rhonchi ?Abd: Soft, gravid, non-tender ?Ext: No bilateral LE edema ? ?A/P: ?G3P1011 at 42w0dadmitted for hyperemsis gravidarum. ? ?- Ready for discharge home today ?- Will send additional antiemetics and Kdur to pharmacy ?- C. Diff testing negative ?- Has upcoming f/u in the office ? ?MDrema Dallas DO ? ?

## 2021-05-27 NOTE — Plan of Care (Signed)

## 2021-05-27 NOTE — Discharge Summary (Addendum)
Physician Discharge Summary  ?Patient ID: ?Lauren Leonard ?MRN: [id number] ?DOB/AGE: 1990/08/13 31 y.o. ? ?Admit date: 05/25/2021 ?Discharge date: 05/27/2021 ? ?Admission Diagnoses: ?Hyperemesis gravidarum ?Second trimester pregnancy ? ?Discharge Diagnoses:  ?Principal Problem: ?  Hyperemesis affecting pregnancy, antepartum ? ? ?Discharged Condition: good ? ?Hospital Course: Patient is a G3P1011 at 42w0dadmitted on 05/25/2021 for N/V/D. Patient was admitted for observation for hyperemesis gravidarum and diarrhea. She was given IV fluids, Zofran, Phenergan and continued scopolamine patch. She had improvement in her nausea/vomiting/diarrhea upon discharge. She was tolerating PO prior to discharge. She was given Kdur for hypokalemia. Patient was discharged home in good condition. ? ?Consults:  None ? ?Significant Diagnostic Studies: labs:  ?CBC Latest Ref Rng & Units 05/25/2021 09/27/2020 07/08/2020  ?WBC 4.0 - 10.5 K/uL 5.5 5.1 5.2  ?Hemoglobin 12.0 - 15.0 g/dL 13.5 12.9 14.6  ?Hematocrit 36.0 - 46.0 % 40.8 39.7 45.7  ?Platelets 150 - 400 K/uL 255 243 286  ? ? ?CMP Latest Ref Rng & Units 05/26/2021 05/25/2021 05/23/2018  ?Glucose 70 - 99 mg/dL 84 96 75  ?BUN 6 - 20 mg/dL <5(L) 5(L) 10  ?Creatinine 0.44 - 1.00 mg/dL 0.72 0.70 0.85  ?Sodium 135 - 145 mmol/L 135 136 140  ?Potassium 3.5 - 5.1 mmol/L 3.0(L) 3.6 4.3  ?Chloride 98 - 111 mmol/L 107 105 100  ?CO2 22 - 32 mmol/L 20(L) 20(L) 25  ?Calcium 8.9 - 10.3 mg/dL 8.1(L) 8.9 9.6  ?Total Protein 6.5 - 8.1 g/dL - 7.0 7.5  ?Total Bilirubin 0.3 - 1.2 mg/dL - 1.0 0.7  ?Alkaline Phos 38 - 126 U/L - 58 96  ?AST 15 - 41 U/L - 21 21  ?ALT 0 - 44 U/L - 23 22  ? ? ?C. Diff: Negative ? ?Treatments: IV hydration and Potassium, Anti-emetics ? ?Discharge Exam: ?Blood pressure (!) 98/48, pulse 83, temperature 98.3 ?F (36.8 ?C), temperature source Oral, resp. rate 16, height '5\' 6"'$  (1.676 m), weight 108.4 kg, last menstrual period 09/07/2020, SpO2 100 %. ?Gen: NAD, pleasant ?Cardio:  RRR ?Lungs:  CTAB, no wheezes/rales/rhonchi ?Abd: Soft, gravid, non-tender ?Ext: No bilateral LE edema ?  ? ?Disposition: Discharge disposition: 01-Home or Self Care ? ? ? ? ? ? ? ?Allergies as of 05/27/2021   ? ?   Reactions  ? Zoloft [sertraline]   ? Makes anxious  ? Citric Acid Rash  ? Breakouts on chest and back.  ? Latex Rash, Other (See Comments)  ? Itches, breaks my hands out, burning  ? ?  ? ?  ?Medication List  ?  ? ?STOP taking these medications   ? ?aspirin-acetaminophen-caffeine 250-250-65 MG tablet ?Commonly known as: EXCEDRIN MIGRAINE ?  ?ibuprofen 800 MG tablet ?Commonly known as: ADVIL ?  ?oxyCODONE 5 MG immediate release tablet ?Commonly known as: Roxicodone ?  ? ?  ? ?TAKE these medications   ? ?albuterol 108 (90 Base) MCG/ACT inhaler ?Commonly known as: VENTOLIN HFA ?Inhale 2 puffs into the lungs every 6 (six) hours as needed for wheezing or shortness of breath. ?  ?azelastine 0.1 % nasal spray ?Commonly known as: ASTELIN ?Place 1 spray into both nostrils 2 (two) times daily. Use in each nostril as directed ?  ?desloratadine 5 MG tablet ?Commonly known as: CLARINEX ?Take 5 mg by mouth daily. ?  ?diphenhydrAMINE 25 MG tablet ?Commonly known as: BENADRYL ?Take 25 mg by mouth at bedtime as needed. ?  ?fexofenadine 180 MG tablet ?Commonly known as: ALLEGRA ?Take 180 mg by mouth daily. ?  ?  montelukast 10 MG tablet ?Commonly known as: SINGULAIR ?Take by mouth at bedtime. ?  ?multivitamin-prenatal 27-0.8 MG Tabs tablet ?Take 1 tablet by mouth daily at 12 noon. ?  ?ondansetron 8 MG disintegrating tablet ?Commonly known as: ZOFRAN-ODT ?Take 1 tablet (8 mg total) by mouth every 8 (eight) hours as needed for nausea or vomiting. ?  ?pantoprazole 40 MG tablet ?Commonly known as: PROTONIX ?Take 40 mg by mouth daily. ?  ?promethazine 25 MG tablet ?Commonly known as: PHENERGAN ?Take 1 tablet (25 mg total) by mouth every 6 (six) hours as needed for nausea or vomiting. ?  ? ?  ? ? Follow-up Information   ? ? [name], DO  Follow up in 2 week(s).   ?Specialty: Obstetrics and Gynecology ?Why: Please keep your upcoming prenatal visit. ?Contact information: ?[address] ?[address] ?[place] [address] ?[phone] ? ? ?  ?  ? ?  ?  ? ?  ? ? ?Signed: ?[name] ?05/27/2021, 8:01 AM ? ? ?

## 2021-06-23 ENCOUNTER — Ambulatory Visit: Payer: BC Managed Care – PPO | Attending: Obstetrics and Gynecology | Admitting: Maternal & Fetal Medicine

## 2021-06-23 ENCOUNTER — Other Ambulatory Visit: Payer: Self-pay | Admitting: *Deleted

## 2021-06-23 ENCOUNTER — Encounter: Payer: Self-pay | Admitting: *Deleted

## 2021-06-23 ENCOUNTER — Other Ambulatory Visit: Payer: Self-pay | Admitting: Obstetrics and Gynecology

## 2021-06-23 ENCOUNTER — Ambulatory Visit: Payer: BC Managed Care – PPO | Admitting: *Deleted

## 2021-06-23 ENCOUNTER — Ambulatory Visit: Payer: BC Managed Care – PPO | Attending: Obstetrics and Gynecology

## 2021-06-23 VITALS — BP 101/55 | HR 82

## 2021-06-23 DIAGNOSIS — O26879 Cervical shortening, unspecified trimester: Secondary | ICD-10-CM | POA: Diagnosis not present

## 2021-06-23 DIAGNOSIS — O09299 Supervision of pregnancy with other poor reproductive or obstetric history, unspecified trimester: Secondary | ICD-10-CM

## 2021-06-23 DIAGNOSIS — Z6841 Body Mass Index (BMI) 40.0 and over, adult: Secondary | ICD-10-CM

## 2021-06-23 DIAGNOSIS — Z363 Encounter for antenatal screening for malformations: Secondary | ICD-10-CM | POA: Diagnosis present

## 2021-06-23 DIAGNOSIS — R638 Other symptoms and signs concerning food and fluid intake: Secondary | ICD-10-CM

## 2021-06-23 NOTE — Progress Notes (Signed)
MFM Brief Note ? ?Ms. Lauren Leonard is a 31 yo G3P1 who is here with an EDD of 11/18/21 she is seen at the request of Dr. [name] regarding a prior history of shortened cervix. ? ?Ms. Lauren Leonard is doing well today without complaints. She denies vaginal bleeding or uterine contractions. ? ?She had a low risk NIPS and neg horizon. ? ?Her prior pregnancy 5638/9373 was complicated by a shortened cervix down to 2.4 cm. She was started on vaginal progesterone she delivered at 38 weeks without complication. ?  ?She had an abnormal pap smear resulting in a LEEP and CKC procedure. ? ?Today we oberved a single intrauterine pregnancy with normal anatomy and measurements consistent with dates. ?Suboptimal views of the fetal heart were observed as well as the spine due to fetal position. ?There was good fetal movement and amniotic fluid volume. ? ?Ms. Lauren Leonard was prescribed nightly vaginal progesterone which she is taking. ? ?We measured her cervix aroun 2.4 cm today as well.  ? ?I reviewed her current plan of care and recommended that we continue nightly vaginal progesterone. ? ?I suspect that she should still have a similar outcome ie delivery at term.  ? ?She will continue pelvic rest as recommended by her providers. ? ?We have scheduled her to return weekly for CL and repeat growth and anatomy in 4 weeks.  ? ?I explained that if these studies are preferred at your offices she can cancel these appointments. ? ?Lastly given her elevated BMI initiate weekly testing at 34 weeks. ? ?I spent 30 minutes with > 50% in face to face consultation. ? ?Vikki Ports, MD ?

## 2021-06-28 ENCOUNTER — Ambulatory Visit: Payer: BC Managed Care – PPO | Attending: Maternal & Fetal Medicine

## 2021-06-28 ENCOUNTER — Other Ambulatory Visit: Payer: Self-pay | Admitting: Maternal & Fetal Medicine

## 2021-06-28 ENCOUNTER — Encounter: Payer: Self-pay | Admitting: *Deleted

## 2021-06-28 ENCOUNTER — Ambulatory Visit: Payer: BC Managed Care – PPO | Admitting: *Deleted

## 2021-06-28 DIAGNOSIS — R638 Other symptoms and signs concerning food and fluid intake: Secondary | ICD-10-CM

## 2021-06-28 DIAGNOSIS — O09299 Supervision of pregnancy with other poor reproductive or obstetric history, unspecified trimester: Secondary | ICD-10-CM

## 2021-06-28 NOTE — Progress Notes (Deleted)
Patient arrived, but cancelled, saying she has another appointment. ?

## 2021-07-07 ENCOUNTER — Ambulatory Visit: Payer: BC Managed Care – PPO | Attending: Maternal & Fetal Medicine

## 2021-07-07 ENCOUNTER — Ambulatory Visit: Payer: BC Managed Care – PPO | Admitting: *Deleted

## 2021-07-07 VITALS — BP 100/54 | HR 70

## 2021-07-07 DIAGNOSIS — O99212 Obesity complicating pregnancy, second trimester: Secondary | ICD-10-CM

## 2021-07-07 DIAGNOSIS — O09292 Supervision of pregnancy with other poor reproductive or obstetric history, second trimester: Secondary | ICD-10-CM

## 2021-07-07 DIAGNOSIS — R638 Other symptoms and signs concerning food and fluid intake: Secondary | ICD-10-CM | POA: Diagnosis present

## 2021-07-07 DIAGNOSIS — E669 Obesity, unspecified: Secondary | ICD-10-CM

## 2021-07-07 DIAGNOSIS — J45909 Unspecified asthma, uncomplicated: Secondary | ICD-10-CM

## 2021-07-07 DIAGNOSIS — O99512 Diseases of the respiratory system complicating pregnancy, second trimester: Secondary | ICD-10-CM | POA: Diagnosis not present

## 2021-07-07 DIAGNOSIS — O3440 Maternal care for other abnormalities of cervix, unspecified trimester: Secondary | ICD-10-CM

## 2021-07-07 DIAGNOSIS — O09299 Supervision of pregnancy with other poor reproductive or obstetric history, unspecified trimester: Secondary | ICD-10-CM | POA: Diagnosis present

## 2021-07-07 DIAGNOSIS — Z3A20 20 weeks gestation of pregnancy: Secondary | ICD-10-CM

## 2021-07-15 ENCOUNTER — Ambulatory Visit: Payer: BC Managed Care – PPO

## 2021-07-21 ENCOUNTER — Other Ambulatory Visit: Payer: Self-pay | Admitting: *Deleted

## 2021-07-21 ENCOUNTER — Ambulatory Visit: Payer: BC Managed Care – PPO | Attending: Maternal & Fetal Medicine | Admitting: Maternal & Fetal Medicine

## 2021-07-21 ENCOUNTER — Encounter (HOSPITAL_COMMUNITY): Payer: Self-pay | Admitting: Obstetrics and Gynecology

## 2021-07-21 ENCOUNTER — Ambulatory Visit: Payer: BC Managed Care – PPO | Admitting: *Deleted

## 2021-07-21 ENCOUNTER — Ambulatory Visit: Payer: BC Managed Care – PPO | Attending: Maternal & Fetal Medicine

## 2021-07-21 VITALS — BP 104/55 | HR 76

## 2021-07-21 DIAGNOSIS — O26879 Cervical shortening, unspecified trimester: Secondary | ICD-10-CM

## 2021-07-21 DIAGNOSIS — O26872 Cervical shortening, second trimester: Secondary | ICD-10-CM

## 2021-07-21 DIAGNOSIS — O99212 Obesity complicating pregnancy, second trimester: Secondary | ICD-10-CM

## 2021-07-21 DIAGNOSIS — O99512 Diseases of the respiratory system complicating pregnancy, second trimester: Secondary | ICD-10-CM | POA: Diagnosis not present

## 2021-07-21 DIAGNOSIS — J45909 Unspecified asthma, uncomplicated: Secondary | ICD-10-CM

## 2021-07-21 DIAGNOSIS — O3442 Maternal care for other abnormalities of cervix, second trimester: Secondary | ICD-10-CM | POA: Diagnosis not present

## 2021-07-21 DIAGNOSIS — R638 Other symptoms and signs concerning food and fluid intake: Secondary | ICD-10-CM | POA: Insufficient documentation

## 2021-07-21 DIAGNOSIS — Z3A22 22 weeks gestation of pregnancy: Secondary | ICD-10-CM

## 2021-07-21 DIAGNOSIS — O09292 Supervision of pregnancy with other poor reproductive or obstetric history, second trimester: Secondary | ICD-10-CM | POA: Diagnosis not present

## 2021-07-21 DIAGNOSIS — O09299 Supervision of pregnancy with other poor reproductive or obstetric history, unspecified trimester: Secondary | ICD-10-CM | POA: Diagnosis not present

## 2021-07-21 NOTE — Progress Notes (Signed)
MFM Brief Note ? ?Ms. Lauren Leonard is a G4P1 who is here at 22 week for a detailed exam and cervical length measurement at the request of Dr. [name]. ? ?Ms. Lauren Leonard is doing well however she does feel pelvic pressure, but denies vaginal bleeding or LOF. ? ?Single intrauterine pregnancy here for a detailed anatomy due to elevated BMI ?Normal anatomy with measurements consistent with dates ?There is good fetal movement and amniotic fluid volume ?Cervical length was 0.8 cm. ? ?A SVE was performed showing external os finger tip in dilation. ? ?I discussed the recommendation for cerclage placement. I discussed this plan of care with Dr. Delora Fuel who agrees and have posted her for a cerclage at 7:30 am. ? ?She will continue night vaginal progesterone and will return in 1 week to asses the cervix.  ? ?I spent 30 minutes with > 50% in face to face consultation. ? ?Vikki Ports, MD ?

## 2021-07-21 NOTE — Anesthesia Preprocedure Evaluation (Addendum)
Anesthesia Evaluation  ?Patient identified by MRN, date of birth, ID band ?Patient awake ? ? ? ?Reviewed: ?Allergy & Precautions, NPO status , Patient's Chart, lab work & pertinent test results ? ?Airway ?Mallampati: II ? ?TM Distance: >3 FB ?Neck ROM: Full ? ? ? Dental ?no notable dental hx. ?(+) Teeth Intact, Dental Advisory Given ?  ?Pulmonary ?asthma ,  ?  ?Pulmonary exam normal ?breath sounds clear to auscultation ? ? ? ? ? ? Cardiovascular ?negative cardio ROS ?Normal cardiovascular exam ?Rhythm:Regular Rate:Normal ? ? ?  ?Neuro/Psych ? Headaches, negative psych ROS  ? GI/Hepatic ?Neg liver ROS, GERD  Medicated,  ?Endo/Other  ?Obesity ? Renal/GU ?negative Renal ROS  ?negative genitourinary ?  ?Musculoskeletal ?Hx/o lipodystrophy  ? Abdominal ?  ?Peds ? Hematology ?negative hematology ROS ?(+)   ?Anesthesia Other Findings ? ? Reproductive/Obstetrics ?(+) Pregnancy ?23 weeks AOG ?Hx/o LEEP  ?Shortened cervix ? ?  ? ? ? ? ? ? ? ? ? ? ? ? ? ?  ?  ? ? ? ? ? ? ? ?Anesthesia Physical ?Anesthesia Plan ? ?ASA: 2 ? ?Anesthesia Plan: Spinal  ? ?Post-op Pain Management: Minimal or no pain anticipated  ? ?Induction: Intravenous ? ?PONV Risk Score and Plan: 2 and Treatment may vary due to age or medical condition and Ondansetron ? ?Airway Management Planned: Natural Airway ? ?Additional Equipment: None ? ?Intra-op Plan:  ? ?Post-operative Plan:  ? ?Informed Consent: I have reviewed the patients History and Physical, chart, labs and discussed the procedure including the risks, benefits and alternatives for the proposed anesthesia with the patient or authorized representative who has indicated his/her understanding and acceptance.  ? ? ? ?Dental advisory given ? ?Plan Discussed with: CRNA and Anesthesiologist ? ?Anesthesia Plan Comments:   ? ? ? ? ? ?Anesthesia Quick Evaluation ? ?

## 2021-07-21 NOTE — H&P (Signed)
HPI: 31 y.o. G4P1011 @ 8w0destimated gestational age (as dated by LMP c/w 6 week ultrasound) presents for cerclage placement for cervical insufficiency. ? 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 ? ?On ultrasound on 07/20/2021, it was noted patient's cervix measured 821m On speculum exam by Dr. BoGertie Exoncervix was 0.5cm with membranes visible but not extruding through the cervical os. It was recommended that patient be admitted for placement of cerclage. ? ? ?Leakage of fluid:  No ?Vaginal bleeding:  No ?Contractions:  No ?Fetal movement:  Yes ? ?Prenatal care has been provided by Dr. MeWellington HampshireDaDelora Fuel? ?ROS:  Denies fevers, chills, chest pain, visual changes, SOB, RUQ/epigastric pain, N/V, dysuria, hematuria, or sudden onset/worsening bilateral LE or facial edema. ? 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 ?Prenatal Transfer Tool  ?Maternal Diabetes: No ?Genetic Screening: Normal ?Maternal Ultrasounds/Referrals: Other:Cervical shortening ?Fetal Ultrasounds or other Referrals:  [facility]  ?Maternal Substance Abuse:  No ?Significant Maternal Medications:  None ?Significant Maternal Lab Results: Other: Unknown ? ? ?Prenatal Labs ?Blood type:  O Positive ?Antibody screen:  Negative ?CBC:  H/H 13.6/39.8 ?Rubella: Immune ?RPR:  Non-reactive ?Hep B:  Negative ?Hep C:  Negative ?HIV:  Negative ?GC/CT:  Negative ?Glucola:  Not performed yet ? ?Immunizations: ?Tdap: Not yet received ?Flu: Vaccinated last season ? ?OBHx:  ?OB History   ? ? Gravida  ?4   ? Para  ?1  ? Term  ?1  ? Preterm  ?   ? AB  ?1  ? Living  ?1  ?  ? ? SAB  ?1  ? IAB  ?   ? Ectopic  ?   ? Multiple  ?0  ? Live Births  ?1  ?   ?  ?  ? ?PMHx:  See above ?Meds:  PNV, Pantoprazole, vaginal progesterone ?Allergy:   ?Allergies  ?Allergen Reactions  ? Zoloft [Sertraline]   ?  Makes anxious  ? Citric Acid Rash  ?  Breakouts on chest and back.  ? Latex Rash and Other (See Comments)  ?  Itches, breaks my hands out, burning  ? ?SurgHx: Hysteroscopy D&C, CKC, LEEP ?SocHx:   Denies Tobacco, ETOH, illicit drugs ? ?O: LMP 02/11/2021  ?Gen. AAOx3, NAD ?CV.  RRR  ?Resp. CTAB, no wheezes/rales/rhonchi ?Abd. Gravid, soft, non-tender throughout, no rebound/guarding ?Extr.  No bilateral LE edema, no calf tenderness bilaterally ?SVE: 0.5cm per Dr. BoShon Batonxam ? ?Last [place] see Imaging tab ? ?FHT: 140s ? ?Labs: see orders ? ?A/P:  [age].o. G4P1011 @ 232w0do is admitted for cerclage placement for cervical insufficiency. ? ?- Admit to L&D ?- Admit labs (CBC, T&S, COVID screen per protocol) ?- FHTs prior to procedure ?- Diet:  NPO ?- IVF:  Per anesthesia ?- VTE Prophylaxis:  SCDs ?- GBS Status:  Unknown ?- Antibiotics: None ?- Anticipate same-day discharge ? ?Consents: ?Patient counseled on the risks, benefits, alternatives to cerclage.  Risks include, but at not limited to, bleeding, requiring blood transfusion, infection, damage to organs and tissues, risk of preterm labor or premature rupture of membranes with  subsequent previable, periviable, or preterm delivery and infant death, risk of failure of procedure to achieve prolongation of pregnancy, deep vein thrombosis and/or pulmonary embolism, complications the course of which cannot be predicted or prevented, and death.   ?Patient was consented for blood products.  The patient is aware that bleeding may result in the need for a blood transfusion which includes risk of transmission of HIV (1:2 million), Hepatitis C (1:2 million), and Hepatitis B (1:200 thousand) and  transfusion reaction.  Patient voiced understanding of the above risks as well as understanding of indications for blood transfusion. ? ? ?[name], DO ?[phone] (office) ? ? ? ? ? ?

## 2021-07-22 ENCOUNTER — Inpatient Hospital Stay (HOSPITAL_COMMUNITY): Payer: BC Managed Care – PPO | Admitting: Anesthesiology

## 2021-07-22 ENCOUNTER — Encounter (HOSPITAL_COMMUNITY)
Admission: RE | Disposition: A | Discharge status: Home / Self Care | Payer: Self-pay | Source: Home / Self Care | Attending: Obstetrics and Gynecology

## 2021-07-22 ENCOUNTER — Encounter (HOSPITAL_COMMUNITY): Payer: Self-pay | Admitting: Obstetrics and Gynecology

## 2021-07-22 ENCOUNTER — Other Ambulatory Visit: Payer: Self-pay

## 2021-07-22 ENCOUNTER — Observation Stay (HOSPITAL_COMMUNITY)
Admission: RE | Admit: 2021-07-22 | Discharge: 2021-07-22 | Disposition: A | Discharge status: Home / Self Care | Payer: BC Managed Care – PPO | Attending: Obstetrics and Gynecology | Admitting: Obstetrics and Gynecology

## 2021-07-22 DIAGNOSIS — E669 Obesity, unspecified: Secondary | ICD-10-CM | POA: Insufficient documentation

## 2021-07-22 DIAGNOSIS — O99212 Obesity complicating pregnancy, second trimester: Secondary | ICD-10-CM | POA: Diagnosis not present

## 2021-07-22 DIAGNOSIS — J45909 Unspecified asthma, uncomplicated: Secondary | ICD-10-CM | POA: Insufficient documentation

## 2021-07-22 DIAGNOSIS — K581 Irritable bowel syndrome with constipation: Secondary | ICD-10-CM | POA: Insufficient documentation

## 2021-07-22 DIAGNOSIS — O3432 Maternal care for cervical incompetence, second trimester: Secondary | ICD-10-CM | POA: Diagnosis not present

## 2021-07-22 DIAGNOSIS — K219 Gastro-esophageal reflux disease without esophagitis: Secondary | ICD-10-CM | POA: Diagnosis not present

## 2021-07-22 DIAGNOSIS — Z9889 Other specified postprocedural states: Secondary | ICD-10-CM | POA: Diagnosis not present

## 2021-07-22 DIAGNOSIS — Z6841 Body Mass Index (BMI) 40.0 and over, adult: Secondary | ICD-10-CM | POA: Diagnosis not present

## 2021-07-22 DIAGNOSIS — Z91040 Latex allergy status: Secondary | ICD-10-CM | POA: Insufficient documentation

## 2021-07-22 DIAGNOSIS — Z79899 Other long term (current) drug therapy: Secondary | ICD-10-CM | POA: Diagnosis not present

## 2021-07-22 DIAGNOSIS — N888 Other specified noninflammatory disorders of cervix uteri: Secondary | ICD-10-CM | POA: Insufficient documentation

## 2021-07-22 DIAGNOSIS — Z3A23 23 weeks gestation of pregnancy: Secondary | ICD-10-CM | POA: Diagnosis not present

## 2021-07-22 DIAGNOSIS — O26872 Cervical shortening, second trimester: Principal | ICD-10-CM | POA: Insufficient documentation

## 2021-07-22 DIAGNOSIS — O99512 Diseases of the respiratory system complicating pregnancy, second trimester: Secondary | ICD-10-CM | POA: Insufficient documentation

## 2021-07-22 DIAGNOSIS — O99612 Diseases of the digestive system complicating pregnancy, second trimester: Secondary | ICD-10-CM | POA: Diagnosis not present

## 2021-07-22 DIAGNOSIS — O26879 Cervical shortening, unspecified trimester: Secondary | ICD-10-CM | POA: Diagnosis present

## 2021-07-22 HISTORY — PX: CERVICAL CERCLAGE: SHX1329

## 2021-07-22 LAB — TYPE AND SCREEN
ABO/RH(D): O POS
Antibody Screen: NEGATIVE

## 2021-07-22 LAB — CBC
HCT: 33.8 % — ABNORMAL LOW (ref 36.0–46.0)
Hemoglobin: 11.5 g/dL — ABNORMAL LOW (ref 12.0–15.0)
MCH: 30.9 pg (ref 26.0–34.0)
MCHC: 34 g/dL (ref 30.0–36.0)
MCV: 90.9 fL (ref 80.0–100.0)
Platelets: 266 10*3/uL (ref 150–400)
RBC: 3.72 MIL/uL — ABNORMAL LOW (ref 3.87–5.11)
RDW: 13.2 % (ref 11.5–15.5)
WBC: 5.6 10*3/uL (ref 4.0–10.5)
nRBC: 0 % (ref 0.0–0.2)

## 2021-07-22 SURGERY — CERCLAGE, CERVIX, VAGINAL APPROACH
Anesthesia: Spinal

## 2021-07-22 MED ORDER — ONDANSETRON HCL 4 MG/2ML IJ SOLN
INTRAMUSCULAR | Status: AC
  Filled 2021-07-22: qty 2

## 2021-07-22 MED ORDER — ONDANSETRON HCL 4 MG/2ML IJ SOLN
INTRAMUSCULAR | Status: DC | PRN
Start: 2021-07-22 — End: 2021-07-22
  Administered 2021-07-22: 4 mg via INTRAVENOUS

## 2021-07-22 MED ORDER — BUPIVACAINE HCL (PF) 0.25 % IJ SOLN
INTRAMUSCULAR | Status: AC
  Filled 2021-07-22: qty 20

## 2021-07-22 MED ORDER — LACTATED RINGERS IV SOLN: INTRAVENOUS | Status: DC

## 2021-07-22 MED ORDER — FENTANYL CITRATE (PF) 100 MCG/2ML IJ SOLN
INTRAMUSCULAR | Status: AC
  Filled 2021-07-22: qty 2

## 2021-07-22 MED ORDER — SILVER NITRATE-POT NITRATE 75-25 % EX MISC
CUTANEOUS | Status: AC
  Filled 2021-07-22: qty 10

## 2021-07-22 MED ORDER — OXYCODONE HCL 5 MG PO TABS
5.0000 mg | ORAL_TABLET | Freq: Once | ORAL | Status: AC | PRN
  Administered 2021-07-22: 5 mg via ORAL

## 2021-07-22 MED ORDER — OXYCODONE HCL 5 MG PO TABS
ORAL_TABLET | ORAL | Status: AC
  Filled 2021-07-22: qty 1

## 2021-07-22 MED ORDER — BUPIVACAINE HCL (PF) 0.25 % IJ SOLN
INTRAMUSCULAR | Status: AC
  Filled 2021-07-22: qty 10

## 2021-07-22 MED ORDER — OXYCODONE HCL 5 MG/5ML PO SOLN: 5.0000 mg | Freq: Once | ORAL | Status: AC | PRN

## 2021-07-22 MED ORDER — BUPIVACAINE IN DEXTROSE 0.75-8.25 % IT SOLN
INTRATHECAL | Status: DC | PRN
  Administered 2021-07-22: 1.2 mL via INTRATHECAL

## 2021-07-22 MED ORDER — FAMOTIDINE IN NACL 20-0.9 MG/50ML-% IV SOLN
20.0000 mg | Freq: Once | INTRAVENOUS | Status: AC
  Administered 2021-07-22: 20 mg via INTRAVENOUS

## 2021-07-22 MED ORDER — FENTANYL CITRATE (PF) 100 MCG/2ML IJ SOLN
INTRAMUSCULAR | Status: DC | PRN
  Administered 2021-07-22 (×2): 50 ug via INTRAVENOUS

## 2021-07-22 MED ORDER — DEXAMETHASONE SODIUM PHOSPHATE 4 MG/ML IJ SOLN
INTRAMUSCULAR | Status: AC
  Filled 2021-07-22: qty 1

## 2021-07-22 MED ORDER — DEXAMETHASONE SODIUM PHOSPHATE 4 MG/ML IJ SOLN
INTRAMUSCULAR | Status: DC | PRN
  Administered 2021-07-22: 4 mg via INTRAVENOUS

## 2021-07-22 MED ORDER — ONDANSETRON HCL 4 MG/2ML IJ SOLN: 4.0000 mg | Freq: Once | INTRAMUSCULAR | Status: DC | PRN

## 2021-07-22 MED ORDER — OXYCODONE HCL 5 MG PO TABS
5.0000 mg | ORAL_TABLET | Freq: Once | ORAL | Status: AC
  Administered 2021-07-22: 5 mg via ORAL

## 2021-07-22 MED ORDER — SODIUM CHLORIDE 0.9 % IR SOLN
Status: DC | PRN
  Administered 2021-07-22: 1

## 2021-07-22 MED ORDER — FENTANYL CITRATE (PF) 100 MCG/2ML IJ SOLN
25.0000 ug | INTRAMUSCULAR | Status: DC | PRN
  Administered 2021-07-22 (×2): 50 ug via INTRAVENOUS

## 2021-07-22 SURGICAL SUPPLY — 18 items
CANISTER SUCT 3000ML PPV (MISCELLANEOUS) ×1 IMPLANT
ELECT REM PT RETURN 9FT ADLT (ELECTROSURGICAL) ×2
ELECTRODE REM PT RTRN 9FT ADLT (ELECTROSURGICAL) IMPLANT
GLOVE BIO SURGEON STRL SZ 6.5 (GLOVE) ×2 IMPLANT
GLOVE BIOGEL M 8.0 STRL (GLOVE) ×1 IMPLANT
GLOVE BIOGEL PI IND STRL 6.5 (GLOVE) IMPLANT
GLOVE BIOGEL PI IND STRL 7.0 (GLOVE) IMPLANT
GLOVE BIOGEL PI INDICATOR 6.5 (GLOVE) ×1
GLOVE BIOGEL PI INDICATOR 7.0 (GLOVE) ×1
GOWN STRL REUS W/ TWL LRG LVL3 (GOWN DISPOSABLE) IMPLANT
GOWN STRL REUS W/TWL LRG LVL3 (GOWN DISPOSABLE) ×2
MAT PREVALON FULL STRYKER (MISCELLANEOUS) ×1 IMPLANT
PACK VAGINAL MINOR WOMEN LF (CUSTOM PROCEDURE TRAY) ×1 IMPLANT
PAD OB MATERNITY 4.3X12.25 (PERSONAL CARE ITEMS) ×1 IMPLANT
SUT MERSILENE FIBER S 5 MO-4 1 (SUTURE) ×1 IMPLANT
SUT PROLENE 1 CT 1 30 (SUTURE) ×1 IMPLANT
TOWEL OR 17X24 6PK STRL BLUE (TOWEL DISPOSABLE) ×1 IMPLANT
YANKAUER SUCT BULB TIP NO VENT (SUCTIONS) ×1 IMPLANT

## 2021-07-22 NOTE — Interval H&P Note (Signed)
History and Physical Interval Note: ? ?07/22/2021 ?7:25 AM ? ?Lauren Leonard  has presented today for surgery, with the diagnosis of Resuce Cervical Cerclage [redacted]w[redacted]d (Alison Stalling ?Needs Bedside Ultra Sound in OR.  The various methods of treatment have been discussed with the patient and family. After consideration of risks, benefits and other options for treatment, the patient has consented to  Procedure(s): ?CERCLAGE CERVICAL (N/A) as a surgical intervention.  The patient's history has been reviewed, patient examined, no change in status, stable for surgery.  I have reviewed the patient's chart and labs.  Questions were answered to the patient's satisfaction.   ? ? ?MDrema Dallas? ? ?

## 2021-07-22 NOTE — Op Note (Addendum)
Preop Diagnosis:  ?1. Single live IUP at [redacted]w[redacted]d?2. Cervical shortening ?3. History of LEEP and CKC ?4. History of cervical shortening in prior pregnancy ? ?Postop Diagnosis:  ?Same as pre-operative diagnoses ? ?Procedure: CERCLAGE CERVICAL  ? ?Anesthesia: Spinal  ? ?Anesthesiologist: FJosephine Igo MD  ? ?Attending: DDrema Dallas DO  ? ?Assistant: Dr. RTama High(assistant required due to complexity of the anatomy) ? ?Findings: Cervix was shortened and appeared closed. Cervix remained closed/50/high after placement of cerclage. ? ?Pathology: None ? ?Fluids: 800cc crystalloid ? ?UOP: 50cc via in-and-out catheter at conclusion of procedure ? ?EBL: 100cc ? ?Complications: None ? ?Procedure:Then patient was taken to the operating room after the risks, benefits and alternatives discussed with the patient and consent signed and witnessed.  The patient was given a spinal per anesthesia and placed in the dorsal lithotomy position.  The patient was prepped and draped in the usual sterile fashion. A small incision using the Bovie of about 2cm was made at the cervicovesical junction and the bladder was gently pushed up. Vesicocervical space was seen but was oozing. A Mersilene 5 tape stitch was placed circumferentially starting at 11 o'clock position and around the cervix. The final stitch exited close to 1 o'clock position. The stitch was placed as high as possible circumferentially. The Mersilene stitch was tied anteriorly at 12 o'clock. Membranes remained intact and post procedure fetal heart rate was obtained. Cervix was closed post-procedure. Hemostasis assured with pressure. Sponge, lap and needle count was correct and the patient was transferred to the recovery room in good condition.   ? ?MDrema Dallas DO ? ?

## 2021-07-22 NOTE — Anesthesia Procedure Notes (Signed)
Spinal ? ?Patient location during procedure: OR ?Start time: 07/22/2021 7:40 AM ?End time: 07/22/2021 7:44 AM ?Reason for block: surgical anesthesia ?Staffing ?Performed: anesthesiologist  ?Anesthesiologist: Josephine Igo, MD ?Preanesthetic Checklist ?Completed: patient identified, IV checked, site marked, risks and benefits discussed, surgical consent, monitors and equipment checked, pre-op evaluation and timeout performed ?Spinal Block ?Patient position: sitting ?Prep: DuraPrep and site prepped and draped ?Patient monitoring: heart rate, cardiac monitor, continuous pulse ox and blood pressure ?Approach: midline ?Location: L3-4 ?Injection technique: single-shot ?Needle ?Needle type: Pencan  ?Needle gauge: 24 G ?Needle length: 9 cm ?Needle insertion depth: 7 cm ?Assessment ?Sensory level: T8 ?Events: CSF return ?Additional Notes ?Patient tolerated procedure well. Adequate sensory level. ? ? ? ? ? ?

## 2021-07-22 NOTE — Discharge Summary (Addendum)
Physician Discharge Summary  ?Patient ID: ?Lauren Leonard ?MRN: [id number] ?DOB/AGE: [birth year] 31 y.o. ? ?Admit date: 07/22/2021 ? ?Discharge date: 07/22/2021 ? ?Admission Diagnoses: ?1. Single live IUP at [redacted]w[redacted]d?2. Cervical shortening ?3. History of LEEP and CKC ?4. History of cervical shortening in prior pregnancy ? ?Discharge Diagnoses:  ?Principal Problem: ?  Cervical shortening ?Same as pre-operative diagnoses ? ?Procedure(s): Cerclage placement ? ?Discharged Condition: good ? ?Hospital Course: Patient was admitted on 07/22/21 for cerclage placement due to cervical shortening noted on MFM ultrasound on 07/21/2021. Mersilene tape cerclage placed and procedure was tolerated well. Fetal heart tones obtained pre and post-procedure. See hospital chart for specific details. Cervix was closed pre and post-procedure. While in PACU, patient had right-sided pain 7/10. She was given IV Fenatnyl, Roxicodone '10mg'$ , and a 1L IVF bolus with a significant improvement in pain. FHT was reassuring for gestational age prior to discharge and no contractions noted on tocometer. Patient was discharged home in stable condition. ? ?Consults: None ? ?Significant Diagnostic Studies: labs:  ? ?  Latest Ref Rng & Units 07/22/2021  ?  6:25 AM 05/25/2021  ?  4:12 PM 09/27/2020  ?  1:26 PM  ?CBC  ?WBC 4.0 - 10.5 K/uL 5.6   5.5   5.1    ?Hemoglobin 12.0 - 15.0 g/dL 11.5   13.5   12.9    ?Hematocrit 36.0 - 46.0 % 33.8   40.8   39.7    ?Platelets 150 - 400 K/uL 266   255   243    ? ? ?Treatments: surgery: See above ? ?Discharge Exam: ?Blood pressure (!) 103/57, pulse 80, temperature 98.5 ?F (36.9 ?C), resp. rate 18, height '5\' 6"'$  (1.676 m), weight 107.6 kg, last menstrual period 02/11/2021. ? ? ?Disposition: Discharge disposition: 01-Home or Self Care ? ? ? ? ? ? ? ?Allergies as of 07/22/2021   ? ?   Reactions  ? Zoloft [sertraline]   ? Makes anxious  ? Citric Acid Rash  ? Breakouts on chest and back.  ? Latex Rash, Other (See Comments)  ? Itches,  breaks my hands out, burning  ? ?  ? ?  ?Medication List  ?  ? ?TAKE these medications   ? ?albuterol 108 (90 Base) MCG/ACT inhaler ?Commonly known as: VENTOLIN HFA ?Inhale 2 puffs into the lungs every 6 (six) hours as needed for wheezing or shortness of breath. ?  ?cetirizine 10 MG tablet ?Commonly known as: ZYRTEC ?Take 10 mg by mouth daily. ?  ?diphenhydrAMINE 25 MG tablet ?Commonly known as: BENADRYL ?Take 25 mg by mouth at bedtime as needed for allergies. ?  ?fexofenadine 180 MG tablet ?Commonly known as: ALLEGRA ?Take 180 mg by mouth daily. ?  ?ipratropium 0.06 % nasal spray ?Commonly known as: ATROVENT ?Place 1 spray into both nostrils in the morning and at bedtime. ?  ?multivitamin-prenatal 27-0.8 MG Tabs tablet ?Take 1 tablet by mouth in the morning. ?  ?ondansetron 8 MG disintegrating tablet ?Commonly known as: ZOFRAN-ODT ?Take 1 tablet (8 mg total) by mouth every 8 (eight) hours as needed for nausea or vomiting. ?  ?ondansetron 4 MG disintegrating tablet ?Commonly known as: ZOFRAN-ODT ?Take 8 mg by mouth every 8 (eight) hours as needed for nausea/vomiting. ?  ?pantoprazole 40 MG tablet ?Commonly known as: PROTONIX ?Take 40 mg by mouth daily before breakfast. ?  ?PROGESTERONE (VAGINAL) 8 % Gel ?Place vaginally. ?  ?promethazine 25 MG tablet ?Commonly known as: PHENERGAN ?Take 1 tablet (25 mg  total) by mouth every 6 (six) hours as needed for nausea or vomiting. ?  ?vitamin B-6 25 MG tablet ?Commonly known as: pyridOXINE ?Take 12.5 mg by mouth in the morning. ?  ?VITAMIN C PO ?Take 1 tablet by mouth in the morning. ?  ?VITAMIN D3 PO ?Take 1 tablet by mouth daily. ?  ? ?  ? ? Follow-up Information   ? ? [name], DO Follow up in 2 week(s).   ?Specialty: Obstetrics and Gynecology ?Why: Please keep your scheduled visit on 08/01/2021 with Dr. Delora Fuel. ?Contact information: ?[address] ?[address] ?[place] [address] ?[phone] ? ? ?  ?  ? ?  ?  ? ?  ? ? ?Signed: ?[name] ?07/22/2021, 8:46  AM ? ? ?

## 2021-07-22 NOTE — Anesthesia Postprocedure Evaluation (Signed)
Anesthesia Post Note ? ?Patient: Lauren Leonard ? ?Procedure(s) Performed: CERCLAGE CERVICAL ? ?  ? ?Patient location during evaluation: PACU ?Anesthesia Type: Spinal ?Level of consciousness: oriented and awake and alert ?Pain management: pain level controlled ?Vital Signs Assessment: post-procedure vital signs reviewed and stable ?Respiratory status: spontaneous breathing, respiratory function stable and nonlabored ventilation ?Cardiovascular status: blood pressure returned to baseline and stable ?Postop Assessment: no headache, no backache, no apparent nausea or vomiting, spinal receding and patient able to bend at knees ?Anesthetic complications: no ? ? ?No notable events documented. ? ?Last Vitals:  ?Vitals:  ? 07/22/21 1001 07/22/21 1015  ?BP:  108/65  ?Pulse:  81  ?Resp: 20 (!) 21  ?Temp:    ?SpO2:  97%  ?  ?Last Pain:  ?Vitals:  ? 07/22/21 1000  ?TempSrc: Oral  ?PainSc: Asleep  ? ?Pain Goal: Patients Stated Pain Goal: 4 (07/22/21 3888) ? ?LLE Motor Response: Purposeful movement (07/22/21 1000) ?LLE Sensation: Tingling (07/22/21 1000) ?RLE Motor Response: Purposeful movement (07/22/21 1000) ?RLE Sensation: Tingling (07/22/21 1000) ?  ?  ?Epidural/Spinal Function Cutaneous sensation: Tingles (07/22/21 1000), Patient able to flex knees: Yes (07/22/21 1000), Patient able to lift hips off bed: No (07/22/21 1000), Back pain beyond tenderness at insertion site: No (07/22/21 1000), Progressively worsening motor and/or sensory loss: No (07/22/21 1000), Bowel and/or bladder incontinence post epidural: No (07/22/21 1000) ? ?[name] ? ? ? ? ?

## 2021-07-22 NOTE — Transfer of Care (Signed)
Immediate Anesthesia Transfer of Care Note ? ?Patient: Lauren Leonard ? ?Procedure(s) Performed: CERCLAGE CERVICAL ? ?Patient Location: PACU ? ?Anesthesia Type:Spinal ? ?Level of Consciousness: awake, alert  and oriented ? ?Airway & Oxygen Therapy: Patient Spontanous Breathing ? ?Post-op Assessment: Report given to RN and Post -op Vital signs reviewed and stable ? ?Post vital signs: Reviewed and stable HR 72, RR 18, SaO2 103/59 ? ?Last Vitals:  ?Vitals Value Taken Time  ?BP    ?Temp    ?Pulse    ?Resp    ?SpO2    ? ? ?Last Pain:  ?Vitals:  ? 07/22/21 0628  ?TempSrc:   ?PainSc: 0-No pain  ?   ? ?  ? ?Complications: No notable events documented. ?

## 2021-07-22 NOTE — Interval H&P Note (Signed)
History and Physical Interval Note: ? ?07/22/2021 ?7:25 AM ? ?Lauren Leonard  has presented today for surgery, with the diagnosis of Resuce Cervical Cerclage 22 weeks 6 days ?Needs Bedside Ultra Sound in OR.  The various methods of treatment have been discussed with the patient and family. After consideration of risks, benefits and other options for treatment, the patient has consented to  Procedure(s): ?CERCLAGE CERVICAL (N/A) as a surgical intervention.  The patient's history has been reviewed, patient examined, no change in status, stable for surgery.  I have reviewed the patient's chart and labs.  Questions were answered to the patient's satisfaction.   ? ? ?[name] ? ? ?

## 2021-08-03 ENCOUNTER — Encounter (HOSPITAL_COMMUNITY): Payer: Self-pay | Admitting: Obstetrics and Gynecology

## 2021-08-03 ENCOUNTER — Inpatient Hospital Stay (HOSPITAL_COMMUNITY)
Admission: AD | Admit: 2021-08-03 | Discharge: 2021-08-04 | Disposition: A | Discharge status: Home / Self Care | Payer: BC Managed Care – PPO | Attending: Obstetrics & Gynecology | Admitting: Obstetrics & Gynecology

## 2021-08-03 ENCOUNTER — Other Ambulatory Visit: Payer: Self-pay | Admitting: Maternal & Fetal Medicine

## 2021-08-03 DIAGNOSIS — O99212 Obesity complicating pregnancy, second trimester: Secondary | ICD-10-CM | POA: Insufficient documentation

## 2021-08-03 DIAGNOSIS — O26892 Other specified pregnancy related conditions, second trimester: Secondary | ICD-10-CM

## 2021-08-03 DIAGNOSIS — O3432 Maternal care for cervical incompetence, second trimester: Secondary | ICD-10-CM | POA: Insufficient documentation

## 2021-08-03 DIAGNOSIS — O09292 Supervision of pregnancy with other poor reproductive or obstetric history, second trimester: Secondary | ICD-10-CM | POA: Insufficient documentation

## 2021-08-03 DIAGNOSIS — O26899 Other specified pregnancy related conditions, unspecified trimester: Secondary | ICD-10-CM | POA: Diagnosis not present

## 2021-08-03 DIAGNOSIS — Z3A24 24 weeks gestation of pregnancy: Secondary | ICD-10-CM | POA: Insufficient documentation

## 2021-08-03 DIAGNOSIS — N898 Other specified noninflammatory disorders of vagina: Secondary | ICD-10-CM

## 2021-08-03 DIAGNOSIS — Z0371 Encounter for suspected problem with amniotic cavity and membrane ruled out: Secondary | ICD-10-CM | POA: Diagnosis not present

## 2021-08-03 LAB — URINALYSIS, ROUTINE W REFLEX MICROSCOPIC
Bilirubin Urine: NEGATIVE
Glucose, UA: NEGATIVE mg/dL
Hgb urine dipstick: NEGATIVE
Ketones, ur: NEGATIVE mg/dL
Leukocytes,Ua: NEGATIVE
Nitrite: NEGATIVE
Protein, ur: NEGATIVE mg/dL
Specific Gravity, Urine: 1.028 (ref 1.005–1.030)
pH: 5 (ref 5.0–8.0)

## 2021-08-03 LAB — AMNISURE RUPTURE OF MEMBRANE (ROM) NOT AT ARMC: Amnisure ROM: NEGATIVE

## 2021-08-03 NOTE — MAU Provider Note (Signed)
Chief Complaint:  Rupture of Membranes   Event Date/Time   First Provider Initiated Contact with Patient 08/03/21 2251     HPI: Lauren Leonard is a 31 y.o. G3P1011 at 23w5dho presents to maternity admissions reporting leaking fluid at 2100.  Has some pelvic pressure.  Had a cerclage placed on 5/5 for effacement.  No problems with first pregnancy.  Using progesterone suppositories. . She reports good fetal movement, denies vaginal bleeding, vaginal itching/burning, urinary symptoms, h/a, dizziness, n/v, diarrhea, constipation or fever/chills.   Vaginal Discharge The patient's primary symptoms include vaginal discharge. The patient's pertinent negatives include no genital itching, genital lesions, genital odor, pelvic pain (pressure) or vaginal bleeding. This is a new problem. The current episode started today. The patient is experiencing no pain. She is pregnant. Pertinent negatives include no abdominal pain, back pain, constipation, diarrhea, dysuria, fever or frequency. The vaginal discharge was clear and watery. There has been no bleeding. She has not been passing clots. She has not been passing tissue. Nothing aggravates the symptoms. She has tried nothing for the symptoms.      RN note: [name] a 31y.o. at 259w5dere in MAU reporting: possible PROM around 2100, as the patient was in a meeting standing, she felt something running down her leg, then started feeling pressure and cramping. Pt reports clear fluid, and she hasn't felt more just more vaginal wetness increase. Pt not wearing a pad. Pt denies DFM, VB, PIH s/s, and intercourse due to pelvic rest.  Cerclage 5/5 for shortening of cervix of .79cm  Past Medical History: Past Medical History:  Diagnosis Date   Chronic rhinitis    [place]   left foramen of menro dx 2015 due to having migraines (previously followed by neurology,  dr v. 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 Please review and update pregnancy details.    Past Surgical History: Past Surgical History:  Procedure Laterality Date   CERVICAL CERCLAGE N/A 07/22/2021   Procedure: CERCLAGE CERVICAL;  Surgeon: DaviDrema Dallas;  [facility];  Service: Gynecology;  Laterality: N/A;   CERVICAL CONIZATION W/BX N/A 09/28/2020   Procedure: COLD KNIFE CONIZATION OF CERVIX WITH BIOPSY;  Surgeon: DaviDrema Dallas;  [place] [facility];  Laterality: N/A;   DILATION AND  EVACUATION N/A 07/08/2020   Procedure: DILATATION AND SUCTION CURETTAGE UNDER ULTRASOUND GUIDANCE.;  Surgeon: [name], DO;  Location: [place];  Service: Gynecology;  Laterality: N/A;   LAPAROSCOPIC CHOLECYSTECTOMY  06/28/12   OPERATIVE ULTRASOUND N/A 07/08/2020   Procedure: OPERATIVE ULTRASOUND;  Surgeon: [name], DO;  Location: [place];  Service: Gynecology;  Laterality: N/A;    Family History: Family History  Problem Relation Age of Onset   Diabetes Maternal Grandmother    Hypertension Maternal Grandmother    Breast cancer Maternal Grandmother     Diabetes Maternal Grandfather    Pancreatic cancer Maternal Grandfather    Migraines Mother    Hypertension Mother    Asthma Mother    Seizures Mother    Thyroid disease Mother    Gout Father    Cerebral aneurysm Other     Social History: Social History   Tobacco Use   Smoking status: Never   Smokeless tobacco: Never  Vaping Use   Vaping Use: Never used  Substance Use Topics   Alcohol use: Not Currently    Comment: rarely    Drug use: Never    Allergies:  Allergies  Allergen Reactions   Zoloft [Sertraline]     Makes anxious   Citric Acid Rash    Breakouts on chest and back.   Latex Rash and Other (See Comments)    Itches, breaks my hands out, burning    Meds:  Medications Prior to Admission  Medication Sig Dispense Refill Last Dose   Ascorbic Acid (VITAMIN C PO) Take 1 tablet by mouth in the morning.   08/03/2021   cetirizine (ZYRTEC) 10 MG tablet Take 10 mg by mouth daily.   08/03/2021   ipratropium (ATROVENT) 0.06 % nasal spray Place 1 spray into both nostrils in the morning and at bedtime.   08/03/2021   ondansetron (ZOFRAN-ODT) 4 MG disintegrating tablet Take 8 mg by mouth every 8 (eight) hours as needed for nausea/vomiting.   08/03/2021   pantoprazole (PROTONIX) 40 MG tablet Take 40 mg by mouth daily before breakfast.   08/03/2021   Prenatal Vit-Fe Fumarate-FA (MULTIVITAMIN-PRENATAL) 27-0.8 MG TABS tablet Take 1 tablet by mouth in the morning.   08/03/2021   promethazine (PHENERGAN) 25 MG tablet Take 1 tablet (25 mg total) by mouth every 6 (six) hours as needed for nausea or vomiting. 120 tablet 3 08/03/2021   vitamin B-6 (PYRIDOXINE) 25 MG tablet Take 12.5 mg by mouth in the morning.   08/03/2021   albuterol (VENTOLIN HFA) 108 (90 Base) MCG/ACT inhaler Inhale 2 puffs into the lungs every 6 (six) hours as needed for wheezing or shortness of breath. 6.7 g 1    Cholecalciferol (VITAMIN D3 PO) Take 1 tablet by mouth daily.      diphenhydrAMINE (BENADRYL) 25 MG tablet Take  25 mg by mouth at bedtime as needed for allergies.      fexofenadine (ALLEGRA) 180 MG tablet Take 180 mg by mouth daily.      ondansetron (ZOFRAN-ODT) 8 MG disintegrating tablet Take 1 tablet (8 mg total) by mouth every 8 (eight) hours as needed for nausea or vomiting. (Patient not taking: Reported on 07/21/2021) 90 tablet 3    PROGESTERONE, VAGINAL, 8 % GEL Place vaginally. (Patient not taking: Reported on 07/21/2021)       I have reviewed patient's Past Medical Hx, Surgical Hx, Family Hx, Social Hx, medications and allergies.   ROS:  Review of Systems  Constitutional:  Negative  for fever.  Gastrointestinal:  Negative for abdominal pain, constipation and diarrhea.  Genitourinary:  Positive for vaginal discharge. Negative for dysuria, frequency and pelvic pain (pressure).  Musculoskeletal:  Negative for back pain.  Other systems negative  Physical Exam  Patient Vitals for the past 24 hrs:  BP Temp Temp src Pulse Resp SpO2 Height Weight  08/03/21 2242 111/61 97.8 F (36.6 C) -- -- -- -- -- --  08/03/21 2241 -- -- Oral 90 20 99 % '5\' 6"'$  (1.676 m) 107 kg   Constitutional: Well-developed, well-nourished female in no acute distress.  Cardiovascular: normal rate and rhythm Respiratory: normal effort, clear to auscultation bilaterally GI: Abd soft, non-tender, gravid appropriate for gestational age.   No rebound or guarding. MS: Extremities nontender, no edema, normal ROM Neurologic: Alert and oriented x 4.  GU: Neg CVAT.  PELVIC EXAM:  Speculum exam:  difficult due to discomfort, unable to see cervix.  Small amount of clear fluid trickling.  Amnisure sent.  Gentle digital exam revealed stitch intact, short cervix, felt closed.  FHT:  Baseline 140 , moderate variability, accelerations present, no decelerations Contractions:  Rare   Labs: Results for orders placed or performed during the hospital encounter of 08/03/21 (from the past 24 hour(s))  Urinalysis, Routine w reflex microscopic      Status: Abnormal   Collection Time: 08/03/21 10:46 PM  Result Value Ref Range   Color, Urine YELLOW YELLOW   APPearance HAZY (A) CLEAR   Specific Gravity, Urine 1.028 1.005 - 1.030   pH 5.0 5.0 - 8.0   Glucose, UA NEGATIVE NEGATIVE mg/dL   Hgb urine dipstick NEGATIVE NEGATIVE   Bilirubin Urine NEGATIVE NEGATIVE   Ketones, ur NEGATIVE NEGATIVE mg/dL   Protein, ur NEGATIVE NEGATIVE mg/dL   Nitrite NEGATIVE NEGATIVE   Leukocytes,Ua NEGATIVE NEGATIVE  Amnisure rupture of membrane (rom)not at [facility]     Status: None   Collection Time: 08/03/21 11:21 PM  Result Value Ref Range   Amnisure ROM NEGATIVE     --/--/O POS (05/05 8127)  Imaging:   MAU Course/MDM: I have reviewed the triage vital signs and the nursing notes.   Pertinent labs & imaging results that were available during my care of the patient were reviewed by me and considered in my medical decision making (see chart for details).      I have reviewed her medical records including past results, notes and treatments.   I have ordered labs and reviewed results. Amnisure is negattive NST reviewed, reassuring for gestational age, no contractions  Treatments in MAU included EFM, SSE, Amnisure.    Assessment: Single IUP at 45w6dVaginal discharge  Negative Amnisure test Pelvic pressure with cerclage present  Plan: Discharge home Preterm Labor precautions and fetal kick counts Follow up in Office for prenatal visits< has MFM appt tomorrow Encouraged to return if she develops worsening of symptoms, increase in pain, fever, or other concerning symptoms.   Pt stable at time of discharge.  MHansel FeinsteinCNM, MSN Certified Nurse-Midwife 08/03/2021 10:51 PM

## 2021-08-03 NOTE — MAU Note (Signed)
.  Lauren Leonard is a 31 y.o. at 54w5dhere in MAU reporting: possible PROM around 2100, as the patient was in a meeting standing, she felt something running down her leg, then started feeling pressure and cramping. Pt reports clear fluid, and she hasn't felt more just more vaginal wetness increase. Pt not wearing a pad. Pt denies DFM, VB, PIH s/s, and intercourse due to pelvic rest.  ?Cerclage 5/5 for shortening of cervix of .79cm ? ?Onset of complaint: 2100 ?Pain score: 4/10 ?Vitals:  ? 08/03/21 2241 08/03/21 2242  ?BP:  111/61  ?Pulse: 90   ?Resp: 20   ?Temp:  97.8 ?F (36.6 ?C)  ?SpO2: 99%   ?   ?FHT:140 ?Lab orders placed from triage:   UA ?

## 2021-08-04 ENCOUNTER — Ambulatory Visit (HOSPITAL_BASED_OUTPATIENT_CLINIC_OR_DEPARTMENT_OTHER): Payer: BC Managed Care – PPO

## 2021-08-04 ENCOUNTER — Ambulatory Visit: Payer: BC Managed Care – PPO | Admitting: *Deleted

## 2021-08-04 VITALS — BP 101/55 | HR 89

## 2021-08-04 DIAGNOSIS — O99512 Diseases of the respiratory system complicating pregnancy, second trimester: Secondary | ICD-10-CM

## 2021-08-04 DIAGNOSIS — O3442 Maternal care for other abnormalities of cervix, second trimester: Secondary | ICD-10-CM | POA: Diagnosis not present

## 2021-08-04 DIAGNOSIS — N898 Other specified noninflammatory disorders of vagina: Secondary | ICD-10-CM

## 2021-08-04 DIAGNOSIS — O99212 Obesity complicating pregnancy, second trimester: Secondary | ICD-10-CM

## 2021-08-04 DIAGNOSIS — Z3A24 24 weeks gestation of pregnancy: Secondary | ICD-10-CM

## 2021-08-04 DIAGNOSIS — R102 Pelvic and perineal pain: Secondary | ICD-10-CM

## 2021-08-04 DIAGNOSIS — O3432 Maternal care for cervical incompetence, second trimester: Secondary | ICD-10-CM

## 2021-08-04 DIAGNOSIS — O09292 Supervision of pregnancy with other poor reproductive or obstetric history, second trimester: Secondary | ICD-10-CM

## 2021-08-04 DIAGNOSIS — O26899 Other specified pregnancy related conditions, unspecified trimester: Secondary | ICD-10-CM

## 2021-08-04 DIAGNOSIS — O26892 Other specified pregnancy related conditions, second trimester: Secondary | ICD-10-CM | POA: Diagnosis not present

## 2021-08-04 DIAGNOSIS — E669 Obesity, unspecified: Secondary | ICD-10-CM | POA: Diagnosis not present

## 2021-08-04 DIAGNOSIS — J45909 Unspecified asthma, uncomplicated: Secondary | ICD-10-CM

## 2021-08-05 ENCOUNTER — Other Ambulatory Visit: Payer: Self-pay | Admitting: *Deleted

## 2021-08-05 DIAGNOSIS — O3440 Maternal care for other abnormalities of cervix, unspecified trimester: Secondary | ICD-10-CM

## 2021-08-05 DIAGNOSIS — O26879 Cervical shortening, unspecified trimester: Secondary | ICD-10-CM

## 2021-08-05 DIAGNOSIS — O99212 Obesity complicating pregnancy, second trimester: Secondary | ICD-10-CM

## 2021-08-19 ENCOUNTER — Ambulatory Visit: Payer: BC Managed Care – PPO | Attending: Obstetrics and Gynecology

## 2021-08-19 ENCOUNTER — Ambulatory Visit: Payer: BC Managed Care – PPO | Admitting: *Deleted

## 2021-08-19 VITALS — BP 113/47 | HR 78

## 2021-08-19 DIAGNOSIS — Z3689 Encounter for other specified antenatal screening: Secondary | ICD-10-CM | POA: Insufficient documentation

## 2021-08-19 DIAGNOSIS — E669 Obesity, unspecified: Secondary | ICD-10-CM | POA: Diagnosis not present

## 2021-08-19 DIAGNOSIS — Z3A27 27 weeks gestation of pregnancy: Secondary | ICD-10-CM

## 2021-08-19 DIAGNOSIS — J45909 Unspecified asthma, uncomplicated: Secondary | ICD-10-CM

## 2021-08-19 DIAGNOSIS — O3432 Maternal care for cervical incompetence, second trimester: Secondary | ICD-10-CM | POA: Diagnosis not present

## 2021-08-19 DIAGNOSIS — O99512 Diseases of the respiratory system complicating pregnancy, second trimester: Secondary | ICD-10-CM

## 2021-08-19 DIAGNOSIS — O09292 Supervision of pregnancy with other poor reproductive or obstetric history, second trimester: Secondary | ICD-10-CM

## 2021-08-19 DIAGNOSIS — O99212 Obesity complicating pregnancy, second trimester: Secondary | ICD-10-CM

## 2021-08-29 LAB — OB RESULTS CONSOLE RPR: RPR: NONREACTIVE

## 2021-08-29 LAB — OB RESULTS CONSOLE HIV ANTIBODY (ROUTINE TESTING): HIV: NONREACTIVE

## 2021-09-06 ENCOUNTER — Inpatient Hospital Stay (HOSPITAL_COMMUNITY)
Admission: AD | Admit: 2021-09-06 | Discharge: 2021-09-06 | Disposition: A | Discharge status: Home / Self Care | Payer: BC Managed Care – PPO | Attending: Obstetrics and Gynecology | Admitting: Obstetrics and Gynecology

## 2021-09-06 ENCOUNTER — Encounter (HOSPITAL_COMMUNITY): Payer: Self-pay | Admitting: Obstetrics and Gynecology

## 2021-09-06 DIAGNOSIS — O368130 Decreased fetal movements, third trimester, not applicable or unspecified: Secondary | ICD-10-CM | POA: Insufficient documentation

## 2021-09-06 DIAGNOSIS — Z3A29 29 weeks gestation of pregnancy: Secondary | ICD-10-CM | POA: Insufficient documentation

## 2021-09-06 NOTE — MAU Provider Note (Signed)
Chief Complaint:  Decreased Fetal Movement   Event Date/Time   First Provider Initiated Contact with Patient 09/06/21 2245     HPI: Lauren Leonard is a 31 y.o. G3P1011 at 31w4dho presents to maternity admissions reporting decreased fetal movement.  States baby has moved, just less than usual.  Has had some cramping at times, few per hour.  States is followed by MFM due to having a cerclage. . She reports good fetal movement, denies LOF, vaginal bleeding, vaginal itching/burning, urinary symptoms, h/a, dizziness, n/v, diarrhea, constipation or fever/chills.  She denies headache, visual changes or RUQ abdominal pain.  Other This is a new problem. The current episode started today. Pertinent negatives include no abdominal pain, chills, fever or nausea. Nothing aggravates the symptoms. She has tried nothing for the symptoms.   RN Note: Lauren Nipperis a 31y.o. at 275w4dere in MAU reporting: she has not felt baby move much today . Stated she only had felt about 8 movement all day total. C/o  pain and cramping when she woke up has gotten  a lot less and not as strong. Maybe a few times an hour. Denies any vag bleeding or leaking a this time.  LMP: Onset of complaint: morning.  Pain score: 3  Past Medical History: Past Medical History:  Diagnosis Date   Chronic rhinitis    [place]   left foramen of menro dx 2015 due to having migraines (previously followed by neurology,  dr v. 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 Please review and update pregnancy details.    Past Surgical History: Past Surgical History:  Procedure Laterality Date   CERVICAL CERCLAGE N/A 07/22/2021   Procedure: CERCLAGE CERVICAL;  Surgeon: DaviDrema Dallas;  [facility];  Service: Gynecology;  Laterality: N/A;   CERVICAL CONIZATION W/BX N/A 09/28/2020   Procedure: COLD KNIFE CONIZATION OF CERVIX WITH BIOPSY;  Surgeon: DaviDrema Dallas;  [place];  Laterality: N/A;   DILATION AND EVACUATION N/A 07/08/2020   Procedure: DILATATION AND SUCTION CURETTAGE UNDER ULTRASOUND GUIDANCE.;  Surgeon: DaviDrema Dallas;  [place];  Laterality: N/A;   LAPAROSCOPIC CHOLECYSTECTOMY  06/28/12   OPERATIVE ULTRASOUND N/A 07/08/2020   Procedure: OPERATIVE ULTRASOUND;  Surgeon: DaviDrema Dallas;  Location: [place];  Service: Gynecology;  Laterality: N/A;    Family History: Family History  Problem Relation Age of Onset   Diabetes Maternal Grandmother    Hypertension Maternal Grandmother    Breast cancer Maternal Grandmother    Diabetes Maternal Grandfather    Pancreatic cancer Maternal Grandfather    Migraines Mother    Hypertension Mother    Asthma Mother    Seizures Mother    Thyroid disease Mother    Gout Father    Cerebral aneurysm Other     Social History: Social History   Tobacco Use   Smoking status: Never   Smokeless tobacco: Never  Vaping Use   Vaping Use: Never used  Substance Use Topics   Alcohol use: Not Currently     Comment: rarely    Drug use: Never    Allergies:  Allergies  Allergen Reactions   Zoloft [Sertraline]     Makes anxious   Citric Acid Rash    Breakouts on chest and back.   Latex Rash and Other (See Comments)    Itches, breaks my hands out, burning    Meds:  Medications Prior to Admission  Medication Sig Dispense Refill Last Dose   albuterol (VENTOLIN HFA) 108 (90 Base) MCG/ACT inhaler Inhale 2 puffs into the lungs every 6 (six) hours as needed for wheezing or shortness of breath. 6.7 g 1 09/05/2021   Ascorbic Acid (VITAMIN C PO) Take 1 tablet by mouth in the morning.   09/05/2021   Cholecalciferol (VITAMIN D3 PO) Take 1 tablet by mouth daily.   09/05/2021   fexofenadine (ALLEGRA) 180 MG tablet Take 180 mg by mouth daily.   09/05/2021   ondansetron (ZOFRAN-ODT) 8 MG disintegrating tablet Take 1 tablet (8 mg total) by mouth every 8 (eight) hours as needed for nausea or vomiting. 90 tablet 3 Past Month   pantoprazole (PROTONIX) 40 MG tablet Take 40 mg by mouth daily before breakfast.   09/05/2021   Prenatal Vit-Fe Fumarate-FA (MULTIVITAMIN-PRENATAL) 27-0.8 MG TABS tablet Take 1 tablet by mouth in the morning.   09/05/2021   PROGESTERONE, VAGINAL, 8 % GEL Place vaginally.   09/05/2021   promethazine (PHENERGAN) 25 MG tablet Take 1 tablet (25 mg total) by mouth every 6 (six) hours as needed for nausea or vomiting. 120 tablet 3 Past Week   vitamin B-6 (PYRIDOXINE) 25 MG tablet Take 12.5 mg by mouth in the morning.   09/05/2021   cetirizine (ZYRTEC) 10 MG tablet Take 10 mg by mouth daily.      diphenhydrAMINE (BENADRYL) 25 MG tablet Take 25 mg by mouth at bedtime as needed for allergies.      ipratropium (ATROVENT) 0.06 % nasal spray Place 1 spray into both nostrils in the morning and at bedtime.      ondansetron (ZOFRAN-ODT) 4 MG disintegrating tablet Take 8 mg by mouth every 8 (eight) hours as needed for nausea/vomiting.       I have reviewed patient's Past Medical Hx, Surgical Hx, Family Hx,  Social Hx, medications and allergies.   ROS:  Review of Systems  Constitutional:  Negative for chills and fever.  Gastrointestinal:  Negative for abdominal pain and nausea.   Other systems negative  Physical Exam  Patient Vitals for the past 24 hrs:  BP Temp Pulse Resp Height Weight  09/06/21 2223 113/60 98.7 F (37.1 C) 82 18 '5\' 6"'$  (1.676 m) 108.4 kg   Constitutional: Well-developed, well-nourished female in no acute distress.  Cardiovascular: normal rate and rhythm Respiratory: normal effort, clear to auscultation bilaterally GI: Abd soft, non-tender, gravid appropriate for gestational age.   No rebound or guarding. MS: Extremities nontender, no edema, normal ROM Neurologic: Alert and oriented x 4.  GU: Neg CVAT.  PELVIC EXAM:  deferred  FHT:  Baseline 125-130 , moderate variability, accelerations present, no decelerations Contractions: Rare   Labs: No results found for this or any previous visit (from the past 24 hour(s)). --/--/O POS (05/05 2446)  Imaging:   MAU Course/MDM: I have reviewed the triage vital signs and the nursing notes.   Pertinent labs & imaging results that were available during my care of the patient were reviewed by me and considered in my medical decision making (see chart for details).      I have reviewed her medical records including past results, notes and treatments.   NST reviewed by myself and Dr Harolyn Rutherford   FHR has remained reassuring throughout and monitor and patient detected fetal movement  Discussed it may be a change in fetal position which has changed her perception of movement  Movement is audible Consult Dr Harolyn Rutherford with presentation, exam findings and test results.  Treatments in MAU included EFM x 1 hour    Assessment: Single IUP at 75w5dDecreased perception of fetal movement Audible fetal movement and reassuring fetal heart rate tracing  Plan: Discharge home Preterm Labor precautions and fetal kick counts Follow up in Office  for prenatal visits   Advised to call office in AM if FM seems decreased Encouraged to return if she develops worsening of symptoms, increase in pain, fever, or other concerning symptoms.  Pt stable at time of discharge.  MHansel FeinsteinCNM, MSN Certified Nurse-Midwife 09/06/2021 10:45 PM

## 2021-09-06 NOTE — MAU Note (Addendum)
.  Lauren Leonard is a 30 y.o. at 19w4dhere in MAU reporting: she has not felt baby move much today . Stated she only had felt about 8 movement all day total. C/o  pain and cramping when she woke up has gotten  a lot less and not as strong. Maybe a few times an hour. Denies any vag bleeding or leaking a this time.  LMP: Onset of complaint: morning.  Pain score: 3 Vitals:   09/06/21 2223  BP: 113/60  Pulse: 82  Resp: 18  Temp: 98.7 F (37.1 C)     FHT:140 Lab orders placed from triage:

## 2021-09-15 ENCOUNTER — Ambulatory Visit: Payer: BC Managed Care – PPO | Admitting: *Deleted

## 2021-09-15 ENCOUNTER — Other Ambulatory Visit: Payer: Self-pay | Admitting: *Deleted

## 2021-09-15 ENCOUNTER — Ambulatory Visit: Payer: BC Managed Care – PPO | Attending: Maternal & Fetal Medicine

## 2021-09-15 VITALS — BP 112/57 | HR 72

## 2021-09-15 DIAGNOSIS — O3433 Maternal care for cervical incompetence, third trimester: Secondary | ICD-10-CM | POA: Insufficient documentation

## 2021-09-15 DIAGNOSIS — J45909 Unspecified asthma, uncomplicated: Secondary | ICD-10-CM

## 2021-09-15 DIAGNOSIS — O26879 Cervical shortening, unspecified trimester: Secondary | ICD-10-CM

## 2021-09-15 DIAGNOSIS — O99212 Obesity complicating pregnancy, second trimester: Secondary | ICD-10-CM | POA: Diagnosis present

## 2021-09-15 DIAGNOSIS — O99213 Obesity complicating pregnancy, third trimester: Secondary | ICD-10-CM

## 2021-09-15 DIAGNOSIS — O09293 Supervision of pregnancy with other poor reproductive or obstetric history, third trimester: Secondary | ICD-10-CM | POA: Diagnosis not present

## 2021-09-15 DIAGNOSIS — Z3A30 30 weeks gestation of pregnancy: Secondary | ICD-10-CM

## 2021-09-15 DIAGNOSIS — O99513 Diseases of the respiratory system complicating pregnancy, third trimester: Secondary | ICD-10-CM | POA: Diagnosis not present

## 2021-09-15 DIAGNOSIS — Z9889 Other specified postprocedural states: Secondary | ICD-10-CM | POA: Insufficient documentation

## 2021-09-15 DIAGNOSIS — E669 Obesity, unspecified: Secondary | ICD-10-CM

## 2021-09-15 DIAGNOSIS — O3440 Maternal care for other abnormalities of cervix, unspecified trimester: Secondary | ICD-10-CM | POA: Diagnosis present

## 2021-09-15 DIAGNOSIS — O3443 Maternal care for other abnormalities of cervix, third trimester: Secondary | ICD-10-CM

## 2021-09-16 ENCOUNTER — Other Ambulatory Visit: Payer: Self-pay | Admitting: *Deleted

## 2021-09-16 DIAGNOSIS — O99213 Obesity complicating pregnancy, third trimester: Secondary | ICD-10-CM

## 2021-09-16 DIAGNOSIS — Z6841 Body Mass Index (BMI) 40.0 and over, adult: Secondary | ICD-10-CM

## 2021-09-16 DIAGNOSIS — O3440 Maternal care for other abnormalities of cervix, unspecified trimester: Secondary | ICD-10-CM

## 2021-09-16 DIAGNOSIS — O09299 Supervision of pregnancy with other poor reproductive or obstetric history, unspecified trimester: Secondary | ICD-10-CM

## 2021-10-14 ENCOUNTER — Ambulatory Visit: Payer: BC Managed Care – PPO | Attending: Maternal & Fetal Medicine

## 2021-10-14 ENCOUNTER — Encounter: Payer: Self-pay | Admitting: *Deleted

## 2021-10-14 ENCOUNTER — Ambulatory Visit: Payer: BC Managed Care – PPO | Admitting: *Deleted

## 2021-10-14 VITALS — BP 139/70 | HR 75

## 2021-10-14 DIAGNOSIS — J45909 Unspecified asthma, uncomplicated: Secondary | ICD-10-CM

## 2021-10-14 DIAGNOSIS — O3430 Maternal care for cervical incompetence, unspecified trimester: Secondary | ICD-10-CM

## 2021-10-14 DIAGNOSIS — Z3A35 35 weeks gestation of pregnancy: Secondary | ICD-10-CM

## 2021-10-14 DIAGNOSIS — Z9889 Other specified postprocedural states: Secondary | ICD-10-CM | POA: Diagnosis present

## 2021-10-14 DIAGNOSIS — O3433 Maternal care for cervical incompetence, third trimester: Secondary | ICD-10-CM

## 2021-10-14 DIAGNOSIS — O09293 Supervision of pregnancy with other poor reproductive or obstetric history, third trimester: Secondary | ICD-10-CM | POA: Insufficient documentation

## 2021-10-14 DIAGNOSIS — O99213 Obesity complicating pregnancy, third trimester: Secondary | ICD-10-CM

## 2021-10-14 DIAGNOSIS — O3443 Maternal care for other abnormalities of cervix, third trimester: Secondary | ICD-10-CM | POA: Insufficient documentation

## 2021-10-14 DIAGNOSIS — O99513 Diseases of the respiratory system complicating pregnancy, third trimester: Secondary | ICD-10-CM | POA: Diagnosis not present

## 2021-10-14 DIAGNOSIS — E669 Obesity, unspecified: Secondary | ICD-10-CM

## 2021-10-14 NOTE — Procedures (Signed)
Lauren Leonard 1990/09/19 [redacted]w[redacted]d Fetus A Non-Stress Test Interpretation for 10/14/21  Indication: Unsatisfactory BPP  Fetal Heart Rate A Mode: External Baseline Rate (A): 140 bpm Variability: Moderate Accelerations: 15 x 15 Decelerations: None Multiple birth?: No  Uterine Activity Mode: Toco Contraction Frequency (min): none Resting Tone Palpated: Relaxed  Interpretation (Fetal Testing) Nonstress Test Interpretation: Reactive Overall Impression: Reassuring for gestational age Comments: tracing reviewed by Dr. FAnnamaria Boots

## 2021-10-20 ENCOUNTER — Ambulatory Visit: Payer: BC Managed Care – PPO | Attending: Obstetrics and Gynecology

## 2021-10-20 ENCOUNTER — Ambulatory Visit: Payer: BC Managed Care – PPO | Admitting: *Deleted

## 2021-10-20 VITALS — BP 116/58 | HR 80

## 2021-10-20 DIAGNOSIS — O3443 Maternal care for other abnormalities of cervix, third trimester: Secondary | ICD-10-CM | POA: Insufficient documentation

## 2021-10-20 DIAGNOSIS — J45909 Unspecified asthma, uncomplicated: Secondary | ICD-10-CM

## 2021-10-20 DIAGNOSIS — O3433 Maternal care for cervical incompetence, third trimester: Secondary | ICD-10-CM

## 2021-10-20 DIAGNOSIS — Z6841 Body Mass Index (BMI) 40.0 and over, adult: Secondary | ICD-10-CM

## 2021-10-20 DIAGNOSIS — O99213 Obesity complicating pregnancy, third trimester: Secondary | ICD-10-CM | POA: Insufficient documentation

## 2021-10-20 DIAGNOSIS — O99513 Diseases of the respiratory system complicating pregnancy, third trimester: Secondary | ICD-10-CM

## 2021-10-20 DIAGNOSIS — Z9889 Other specified postprocedural states: Secondary | ICD-10-CM | POA: Diagnosis present

## 2021-10-20 DIAGNOSIS — E669 Obesity, unspecified: Secondary | ICD-10-CM

## 2021-10-20 DIAGNOSIS — Z3A35 35 weeks gestation of pregnancy: Secondary | ICD-10-CM

## 2021-10-20 DIAGNOSIS — O09293 Supervision of pregnancy with other poor reproductive or obstetric history, third trimester: Secondary | ICD-10-CM | POA: Diagnosis present

## 2021-10-24 LAB — OB RESULTS CONSOLE GBS: GBS: NEGATIVE

## 2021-10-24 LAB — OB RESULTS CONSOLE GC/CHLAMYDIA
Chlamydia: NEGATIVE
Neisseria Gonorrhea: NEGATIVE

## 2021-10-27 ENCOUNTER — Ambulatory Visit: Payer: BC Managed Care – PPO | Admitting: *Deleted

## 2021-10-27 ENCOUNTER — Ambulatory Visit: Payer: BC Managed Care – PPO | Attending: Obstetrics and Gynecology

## 2021-10-27 VITALS — BP 117/65 | HR 68

## 2021-10-27 DIAGNOSIS — J45909 Unspecified asthma, uncomplicated: Secondary | ICD-10-CM

## 2021-10-27 DIAGNOSIS — O3440 Maternal care for other abnormalities of cervix, unspecified trimester: Secondary | ICD-10-CM | POA: Diagnosis present

## 2021-10-27 DIAGNOSIS — O99513 Diseases of the respiratory system complicating pregnancy, third trimester: Secondary | ICD-10-CM | POA: Diagnosis not present

## 2021-10-27 DIAGNOSIS — O99213 Obesity complicating pregnancy, third trimester: Secondary | ICD-10-CM | POA: Insufficient documentation

## 2021-10-27 DIAGNOSIS — E669 Obesity, unspecified: Secondary | ICD-10-CM

## 2021-10-27 DIAGNOSIS — Z6841 Body Mass Index (BMI) 40.0 and over, adult: Secondary | ICD-10-CM

## 2021-10-27 DIAGNOSIS — O3443 Maternal care for other abnormalities of cervix, third trimester: Secondary | ICD-10-CM | POA: Diagnosis not present

## 2021-10-27 DIAGNOSIS — O09299 Supervision of pregnancy with other poor reproductive or obstetric history, unspecified trimester: Secondary | ICD-10-CM | POA: Diagnosis present

## 2021-10-27 DIAGNOSIS — Z9889 Other specified postprocedural states: Secondary | ICD-10-CM | POA: Diagnosis not present

## 2021-10-27 DIAGNOSIS — O3433 Maternal care for cervical incompetence, third trimester: Secondary | ICD-10-CM

## 2021-10-27 DIAGNOSIS — Z3A36 36 weeks gestation of pregnancy: Secondary | ICD-10-CM

## 2021-10-28 ENCOUNTER — Encounter (HOSPITAL_COMMUNITY): Payer: Self-pay | Admitting: Obstetrics and Gynecology

## 2021-10-28 ENCOUNTER — Inpatient Hospital Stay (HOSPITAL_COMMUNITY)
Admission: AD | Admit: 2021-10-28 | Discharge: 2021-10-28 | Disposition: A | Discharge status: Home / Self Care | Payer: BC Managed Care – PPO | Attending: Obstetrics and Gynecology | Admitting: Obstetrics and Gynecology

## 2021-10-28 ENCOUNTER — Other Ambulatory Visit: Payer: Self-pay

## 2021-10-28 DIAGNOSIS — O99891 Other specified diseases and conditions complicating pregnancy: Secondary | ICD-10-CM | POA: Insufficient documentation

## 2021-10-28 DIAGNOSIS — Z3A37 37 weeks gestation of pregnancy: Secondary | ICD-10-CM | POA: Insufficient documentation

## 2021-10-28 DIAGNOSIS — O26893 Other specified pregnancy related conditions, third trimester: Secondary | ICD-10-CM | POA: Insufficient documentation

## 2021-10-28 DIAGNOSIS — Z0371 Encounter for suspected problem with amniotic cavity and membrane ruled out: Secondary | ICD-10-CM | POA: Diagnosis not present

## 2021-10-28 DIAGNOSIS — Z3689 Encounter for other specified antenatal screening: Secondary | ICD-10-CM

## 2021-10-28 LAB — POCT FERN TEST: POCT Fern Test: NEGATIVE

## 2021-10-28 NOTE — MAU Provider Note (Signed)
S: [name] is a 31 y.o. G3P1011 at [redacted]w[redacted]d who presents to MAU today complaining of leaking of fluid since her baby shower this afternoon. She denies vaginal bleeding. She endorses contractions, but reports that it feels like pressure. She reports normal fetal movement.  She questions why she is having back pain.   O: BP 115/65 (BP Location: Right Arm)   Pulse 87   Temp 98.2 F (36.8 C) (Oral)   Resp 20   Ht '5\' 6"'$  (1.676 m)   Wt 107.5 kg   LMP 02/11/2021   BMI 38.27 kg/m  GENERAL: Well-developed, well-nourished female in no acute distress.  HEAD: Normocephalic, atraumatic.  CHEST: Normal effort of breathing, regular heart rate ABDOMEN: Soft, nontender, gravid PELVIC: Normal external female genitalia. Vagina is pink and rugated. Cervix with normal contour, no lesions. Normal discharge.  negative pooling. FWilmar  Cervical exam:   Deferred   Fetal Monitoring: FHT: 135 bpm, Mod Var, -Decels, +Accels Toco: No ctx graphed  No results found for this or any previous visit (from the past 24 hour(s)).   A: SIUP at 325w0dMembranes intact Cat I FT  P: -Fern Negative -Reassured that back pain is normal, in pregnancy, due to normal physiological changes.  -Discussed some methods for relieving back pain including heat applications, tylenol, and rest when possible.  -Patient verbalizes understanding.  -Plan for discharge to home and patient to follow up in office as scheduled.   EmGavin PoundCNM 10/28/2021 8:50 PM

## 2021-10-28 NOTE — MAU Note (Signed)
Had her cerclage removed on Monday- Dr Delora Fuel.  VE  1/2 cm  Pt says at 2p  today - leaking fluid on a pad.  Feels UC - feels some strong and reg  Denies HSV GBS- collected on MOnday

## 2021-11-03 ENCOUNTER — Ambulatory Visit: Payer: BC Managed Care – PPO | Attending: Obstetrics and Gynecology

## 2021-11-03 ENCOUNTER — Ambulatory Visit: Payer: BC Managed Care – PPO | Admitting: *Deleted

## 2021-11-03 VITALS — BP 113/62 | HR 81

## 2021-11-03 DIAGNOSIS — O3433 Maternal care for cervical incompetence, third trimester: Secondary | ICD-10-CM

## 2021-11-03 DIAGNOSIS — Z3A37 37 weeks gestation of pregnancy: Secondary | ICD-10-CM

## 2021-11-03 DIAGNOSIS — O09299 Supervision of pregnancy with other poor reproductive or obstetric history, unspecified trimester: Secondary | ICD-10-CM | POA: Diagnosis present

## 2021-11-03 DIAGNOSIS — J45909 Unspecified asthma, uncomplicated: Secondary | ICD-10-CM | POA: Diagnosis not present

## 2021-11-03 DIAGNOSIS — O99513 Diseases of the respiratory system complicating pregnancy, third trimester: Secondary | ICD-10-CM | POA: Diagnosis not present

## 2021-11-03 DIAGNOSIS — Z6841 Body Mass Index (BMI) 40.0 and over, adult: Secondary | ICD-10-CM | POA: Diagnosis present

## 2021-11-03 DIAGNOSIS — O99213 Obesity complicating pregnancy, third trimester: Secondary | ICD-10-CM | POA: Insufficient documentation

## 2021-11-03 DIAGNOSIS — Z9889 Other specified postprocedural states: Secondary | ICD-10-CM | POA: Diagnosis present

## 2021-11-03 DIAGNOSIS — O3443 Maternal care for other abnormalities of cervix, third trimester: Secondary | ICD-10-CM

## 2021-11-03 DIAGNOSIS — E669 Obesity, unspecified: Secondary | ICD-10-CM

## 2021-11-03 DIAGNOSIS — O3440 Maternal care for other abnormalities of cervix, unspecified trimester: Secondary | ICD-10-CM | POA: Diagnosis present

## 2021-11-07 ENCOUNTER — Encounter (HOSPITAL_COMMUNITY): Payer: Self-pay | Admitting: Obstetrics and Gynecology

## 2021-11-07 ENCOUNTER — Inpatient Hospital Stay (HOSPITAL_COMMUNITY)
Admission: AD | Admit: 2021-11-07 | Discharge: 2021-11-09 | DRG: 805 | Disposition: A | Discharge status: Home / Self Care | Payer: BC Managed Care – PPO | Attending: Obstetrics and Gynecology | Admitting: Obstetrics and Gynecology

## 2021-11-07 ENCOUNTER — Inpatient Hospital Stay (EMERGENCY_DEPARTMENT_HOSPITAL)
Admission: AD | Admit: 2021-11-07 | Discharge: 2021-11-07 | Disposition: A | Discharge status: Home / Self Care | Payer: BC Managed Care – PPO | Source: Home / Self Care | Attending: Obstetrics and Gynecology | Admitting: Obstetrics and Gynecology

## 2021-11-07 ENCOUNTER — Inpatient Hospital Stay (HOSPITAL_BASED_OUTPATIENT_CLINIC_OR_DEPARTMENT_OTHER): Payer: BC Managed Care – PPO

## 2021-11-07 DIAGNOSIS — K219 Gastro-esophageal reflux disease without esophagitis: Secondary | ICD-10-CM | POA: Diagnosis present

## 2021-11-07 DIAGNOSIS — O3433 Maternal care for cervical incompetence, third trimester: Secondary | ICD-10-CM | POA: Diagnosis present

## 2021-11-07 DIAGNOSIS — O471 False labor at or after 37 completed weeks of gestation: Secondary | ICD-10-CM | POA: Insufficient documentation

## 2021-11-07 DIAGNOSIS — O368130 Decreased fetal movements, third trimester, not applicable or unspecified: Principal | ICD-10-CM | POA: Diagnosis present

## 2021-11-07 DIAGNOSIS — O99213 Obesity complicating pregnancy, third trimester: Secondary | ICD-10-CM

## 2021-11-07 DIAGNOSIS — O403XX0 Polyhydramnios, third trimester, not applicable or unspecified: Secondary | ICD-10-CM

## 2021-11-07 DIAGNOSIS — O26893 Other specified pregnancy related conditions, third trimester: Secondary | ICD-10-CM | POA: Diagnosis not present

## 2021-11-07 DIAGNOSIS — Z3A38 38 weeks gestation of pregnancy: Secondary | ICD-10-CM

## 2021-11-07 DIAGNOSIS — J4520 Mild intermittent asthma, uncomplicated: Secondary | ICD-10-CM | POA: Diagnosis present

## 2021-11-07 DIAGNOSIS — O9962 Diseases of the digestive system complicating childbirth: Secondary | ICD-10-CM | POA: Diagnosis present

## 2021-11-07 DIAGNOSIS — O9952 Diseases of the respiratory system complicating childbirth: Secondary | ICD-10-CM | POA: Diagnosis present

## 2021-11-07 DIAGNOSIS — O99214 Obesity complicating childbirth: Secondary | ICD-10-CM | POA: Diagnosis present

## 2021-11-07 MED ORDER — ONDANSETRON HCL 4 MG/2ML IJ SOLN
4.0000 mg | Freq: Once | INTRAMUSCULAR | Status: AC
  Administered 2021-11-07: 4 mg via INTRAVENOUS
  Filled 2021-11-07: qty 2

## 2021-11-07 MED ORDER — LACTATED RINGERS IV BOLUS
1000.0000 mL | Freq: Once | INTRAVENOUS | Status: AC
  Administered 2021-11-07: 1000 mL via INTRAVENOUS

## 2021-11-07 MED ORDER — FENTANYL CITRATE (PF) 100 MCG/2ML IJ SOLN
100.0000 ug | Freq: Once | INTRAMUSCULAR | Status: AC
  Administered 2021-11-07: 100 ug via INTRAVENOUS
  Filled 2021-11-07: qty 2

## 2021-11-07 MED ORDER — MORPHINE SULFATE (PF) 4 MG/ML IV SOLN
4.0000 mg | Freq: Once | INTRAVENOUS | Status: AC
  Administered 2021-11-07: 4 mg via INTRAMUSCULAR
  Filled 2021-11-07: qty 1

## 2021-11-07 NOTE — MAU Note (Signed)
Dr. Cole at bedside

## 2021-11-07 NOTE — MAU Note (Signed)
.  Lauren Leonard is a 31 y.o. at 32w3dhere in MAU reporting: ctx since about 8:30 this morning. Reports some leaking no big gush of fluid. Good fetal movement felt. Pt stated removed knot to cerclage on 8/7. Not able to remove stiches.    Onset of complaint: 830 Pain score: 9 Vitals:   11/07/21 1209  BP: 131/77  Pulse: 62  Resp: 18     FHT:130 Lab orders placed from triage:  mau  labor eval

## 2021-11-07 NOTE — MAU Provider Note (Signed)
   S: Ms. Lauren Leonard is a 31 y.o. G3P1011 at [redacted]w[redacted]d who presents to MAU today complaining contractions q 6-8 minutes since 8:30a. She denies vaginal bleeding. She denies LOF. She reports normal fetal movement.    Patient evaluated by nurse for labor rule out.  O: BP 124/71   Pulse (!) 56   Resp 18   LMP 02/11/2021   SpO2 100%   Cervical exam:  Dilation: 1 Effacement (%): 80 Cervical Position: Middle Station: -2 Presentation: Vertex Exam by:: K.Wilosn,RN   Fetal Monitoring: Baseline: 130 Variability: moderate Accelerations: present Decelerations: none Contractions: none   A: SIUP at 357w3dFalse labor - early labor  P: Will give IM dose of morphine. Discharge to home.  StTruett MainlandDO 11/07/2021 2:25 PM

## 2021-11-07 NOTE — MAU Note (Signed)
.  Lauren Leonard is a 31 y.o. at 27w3dhere in MAU reporting: was here this morning for labor eval and sent home for unchanged cervical exam.. here stating ctx are stronger and every 2-3 minutes. Denies LOF or VB. +FM   Onset of complaint: 0930 Pain score: 10 Vitals:   11/07/21 2001  BP: 129/71  Pulse: 64  Resp: 19  Temp: 97.6 F (36.4 C)  SpO2: 100%     FHT: EFM placed in room 130s Lab orders placed from triage:  none.

## 2021-11-08 ENCOUNTER — Encounter (HOSPITAL_COMMUNITY): Payer: Self-pay | Admitting: Obstetrics and Gynecology

## 2021-11-08 ENCOUNTER — Inpatient Hospital Stay (HOSPITAL_COMMUNITY): Payer: BC Managed Care – PPO | Admitting: Anesthesiology

## 2021-11-08 ENCOUNTER — Other Ambulatory Visit: Payer: Self-pay

## 2021-11-08 DIAGNOSIS — O368130 Decreased fetal movements, third trimester, not applicable or unspecified: Secondary | ICD-10-CM | POA: Diagnosis present

## 2021-11-08 DIAGNOSIS — O9962 Diseases of the digestive system complicating childbirth: Secondary | ICD-10-CM | POA: Diagnosis present

## 2021-11-08 DIAGNOSIS — O3433 Maternal care for cervical incompetence, third trimester: Secondary | ICD-10-CM | POA: Diagnosis present

## 2021-11-08 DIAGNOSIS — O9952 Diseases of the respiratory system complicating childbirth: Secondary | ICD-10-CM | POA: Diagnosis present

## 2021-11-08 DIAGNOSIS — J4520 Mild intermittent asthma, uncomplicated: Secondary | ICD-10-CM | POA: Diagnosis present

## 2021-11-08 DIAGNOSIS — Z3A38 38 weeks gestation of pregnancy: Secondary | ICD-10-CM | POA: Diagnosis not present

## 2021-11-08 DIAGNOSIS — O99214 Obesity complicating childbirth: Secondary | ICD-10-CM | POA: Diagnosis present

## 2021-11-08 DIAGNOSIS — K219 Gastro-esophageal reflux disease without esophagitis: Secondary | ICD-10-CM | POA: Diagnosis present

## 2021-11-08 DIAGNOSIS — O26893 Other specified pregnancy related conditions, third trimester: Secondary | ICD-10-CM | POA: Diagnosis present

## 2021-11-08 LAB — CBC
HCT: 39.4 % (ref 36.0–46.0)
Hemoglobin: 13.2 g/dL (ref 12.0–15.0)
MCH: 30.2 pg (ref 26.0–34.0)
MCHC: 33.5 g/dL (ref 30.0–36.0)
MCV: 90.2 fL (ref 80.0–100.0)
Platelets: 270 10*3/uL (ref 150–400)
RBC: 4.37 MIL/uL (ref 3.87–5.11)
RDW: 13.4 % (ref 11.5–15.5)
WBC: 7.8 10*3/uL (ref 4.0–10.5)
nRBC: 0 % (ref 0.0–0.2)

## 2021-11-08 LAB — TYPE AND SCREEN
ABO/RH(D): O POS
Antibody Screen: NEGATIVE

## 2021-11-08 LAB — RPR: RPR Ser Ql: NONREACTIVE

## 2021-11-08 MED ORDER — LACTATED RINGERS IV SOLN
INTRAVENOUS | Status: DC
Start: 2021-11-08 — End: 2021-11-08

## 2021-11-08 MED ORDER — OXYCODONE HCL 5 MG PO TABS
10.0000 mg | ORAL_TABLET | ORAL | Status: DC | PRN
  Administered 2021-11-09: 10 mg via ORAL
  Filled 2021-11-08: qty 2

## 2021-11-08 MED ORDER — TRANEXAMIC ACID-NACL 1000-0.7 MG/100ML-% IV SOLN
INTRAVENOUS | Status: AC
  Administered 2021-11-08: 1000 mg
  Filled 2021-11-08: qty 100

## 2021-11-08 MED ORDER — PRENATAL MULTIVITAMIN CH
1.0000 | ORAL_TABLET | Freq: Every day | ORAL | Status: DC
  Administered 2021-11-08: 1 via ORAL
  Filled 2021-11-08: qty 1

## 2021-11-08 MED ORDER — LIDOCAINE HCL (PF) 1 % IJ SOLN
INTRAMUSCULAR | Status: DC | PRN
  Administered 2021-11-08: 2 mL via EPIDURAL
  Administered 2021-11-08: 3 mL via EPIDURAL
  Administered 2021-11-08: 5 mL via EPIDURAL

## 2021-11-08 MED ORDER — BENZOCAINE-MENTHOL 20-0.5 % EX AERO
1.0000 | INHALATION_SPRAY | CUTANEOUS | Status: DC | PRN
  Administered 2021-11-08: 1 via TOPICAL
  Filled 2021-11-08: qty 56

## 2021-11-08 MED ORDER — IBUPROFEN 600 MG PO TABS
600.0000 mg | ORAL_TABLET | Freq: Four times a day (QID) | ORAL | Status: DC
Start: 2021-11-08 — End: 2021-11-09
  Administered 2021-11-08 – 2021-11-09 (×4): 600 mg via ORAL
  Filled 2021-11-08 (×4): qty 1

## 2021-11-08 MED ORDER — ACETAMINOPHEN 325 MG PO TABS
650.0000 mg | ORAL_TABLET | ORAL | Status: DC | PRN
Start: 2021-11-08 — End: 2021-11-08

## 2021-11-08 MED ORDER — EPHEDRINE 5 MG/ML INJ
10.0000 mg | INTRAVENOUS | Status: DC | PRN
Start: 2021-11-08 — End: 2021-11-08

## 2021-11-08 MED ORDER — FENTANYL-BUPIVACAINE-NACL 0.5-0.125-0.9 MG/250ML-% EP SOLN
12.0000 mL/h | EPIDURAL | Status: DC | PRN
  Administered 2021-11-08: 12 mL/h via EPIDURAL
  Filled 2021-11-08: qty 250

## 2021-11-08 MED ORDER — OXYTOCIN BOLUS FROM INFUSION
333.0000 mL | Freq: Once | INTRAVENOUS | Status: AC
  Administered 2021-11-08: 333 mL via INTRAVENOUS

## 2021-11-08 MED ORDER — OXYCODONE-ACETAMINOPHEN 5-325 MG PO TABS: 1.0000 | ORAL_TABLET | ORAL | Status: DC | PRN

## 2021-11-08 MED ORDER — EPHEDRINE 5 MG/ML INJ: 10.0000 mg | INTRAVENOUS | Status: DC | PRN

## 2021-11-08 MED ORDER — LIDOCAINE HCL (PF) 1 % IJ SOLN: 30.0000 mL | INTRAMUSCULAR | Status: DC | PRN

## 2021-11-08 MED ORDER — COCONUT OIL OIL: 1.0000 | TOPICAL_OIL | Status: DC | PRN

## 2021-11-08 MED ORDER — IPRATROPIUM BROMIDE 0.06 % NA SOLN: 1.0000 | Freq: Three times a day (TID) | NASAL | Status: DC

## 2021-11-08 MED ORDER — LACTATED RINGERS IV SOLN: 500.0000 mL | Freq: Once | INTRAVENOUS | Status: DC

## 2021-11-08 MED ORDER — DIBUCAINE (PERIANAL) 1 % EX OINT: 1.0000 | TOPICAL_OINTMENT | CUTANEOUS | Status: DC | PRN

## 2021-11-08 MED ORDER — TERBUTALINE SULFATE 1 MG/ML IJ SOLN: 0.2500 mg | Freq: Once | INTRAMUSCULAR | Status: DC | PRN

## 2021-11-08 MED ORDER — LACTATED RINGERS IV SOLN: 500.0000 mL | INTRAVENOUS | Status: DC | PRN

## 2021-11-08 MED ORDER — ONDANSETRON HCL 4 MG/2ML IJ SOLN: 4.0000 mg | INTRAMUSCULAR | Status: DC | PRN

## 2021-11-08 MED ORDER — WITCH HAZEL-GLYCERIN EX PADS: 1.0000 | MEDICATED_PAD | CUTANEOUS | Status: DC | PRN

## 2021-11-08 MED ORDER — FENTANYL CITRATE (PF) 100 MCG/2ML IJ SOLN
50.0000 ug | INTRAMUSCULAR | Status: DC | PRN
  Administered 2021-11-08: 100 ug via INTRAVENOUS
  Filled 2021-11-08: qty 2

## 2021-11-08 MED ORDER — SIMETHICONE 80 MG PO CHEW: 80.0000 mg | CHEWABLE_TABLET | ORAL | Status: DC | PRN

## 2021-11-08 MED ORDER — ONDANSETRON HCL 4 MG/2ML IJ SOLN: 4.0000 mg | Freq: Four times a day (QID) | INTRAMUSCULAR | Status: DC | PRN

## 2021-11-08 MED ORDER — DIPHENHYDRAMINE HCL 50 MG/ML IJ SOLN: 12.5000 mg | INTRAMUSCULAR | Status: DC | PRN

## 2021-11-08 MED ORDER — PHENYLEPHRINE 80 MCG/ML (10ML) SYRINGE FOR IV PUSH (FOR BLOOD PRESSURE SUPPORT): 80.0000 ug | PREFILLED_SYRINGE | INTRAVENOUS | Status: DC | PRN

## 2021-11-08 MED ORDER — ALBUTEROL SULFATE HFA 108 (90 BASE) MCG/ACT IN AERS
2.0000 | INHALATION_SPRAY | Freq: Four times a day (QID) | RESPIRATORY_TRACT | Status: DC | PRN
Start: 2021-11-08 — End: 2021-11-08

## 2021-11-08 MED ORDER — ONDANSETRON HCL 4 MG PO TABS: 4.0000 mg | ORAL_TABLET | ORAL | Status: DC | PRN

## 2021-11-08 MED ORDER — OXYCODONE-ACETAMINOPHEN 5-325 MG PO TABS: 2.0000 | ORAL_TABLET | ORAL | Status: DC | PRN

## 2021-11-08 MED ORDER — ZOLPIDEM TARTRATE 5 MG PO TABS: 5.0000 mg | ORAL_TABLET | Freq: Every evening | ORAL | Status: DC | PRN

## 2021-11-08 MED ORDER — SOD CITRATE-CITRIC ACID 500-334 MG/5ML PO SOLN: 30.0000 mL | ORAL | Status: DC | PRN

## 2021-11-08 MED ORDER — OXYTOCIN-SODIUM CHLORIDE 30-0.9 UT/500ML-% IV SOLN
1.0000 m[IU]/min | INTRAVENOUS | Status: DC
  Filled 2021-11-08: qty 500

## 2021-11-08 MED ORDER — OXYCODONE HCL 5 MG PO TABS
5.0000 mg | ORAL_TABLET | ORAL | Status: DC | PRN
  Administered 2021-11-08 – 2021-11-09 (×2): 5 mg via ORAL
  Filled 2021-11-08 (×2): qty 1

## 2021-11-08 MED ORDER — SENNOSIDES-DOCUSATE SODIUM 8.6-50 MG PO TABS
2.0000 | ORAL_TABLET | Freq: Every day | ORAL | Status: DC
  Administered 2021-11-09: 2 via ORAL
  Filled 2021-11-08: qty 2

## 2021-11-08 MED ORDER — HYDROXYZINE HCL 50 MG PO TABS: 50.0000 mg | ORAL_TABLET | Freq: Four times a day (QID) | ORAL | Status: DC | PRN

## 2021-11-08 MED ORDER — DIPHENHYDRAMINE HCL 25 MG PO CAPS: 25.0000 mg | ORAL_CAPSULE | Freq: Four times a day (QID) | ORAL | Status: DC | PRN

## 2021-11-08 MED ORDER — OXYTOCIN-SODIUM CHLORIDE 30-0.9 UT/500ML-% IV SOLN
2.5000 [IU]/h | INTRAVENOUS | Status: DC
  Administered 2021-11-08: 2.5 [IU]/h via INTRAVENOUS

## 2021-11-08 MED ORDER — ACETAMINOPHEN 325 MG PO TABS
650.0000 mg | ORAL_TABLET | ORAL | Status: DC | PRN
  Administered 2021-11-08 – 2021-11-09 (×4): 650 mg via ORAL
  Filled 2021-11-08 (×4): qty 2

## 2021-11-08 NOTE — Progress Notes (Signed)
OB Progress Note  S: Patient resting comfortably with epidural. Consents to AROM and augmentation with Pitocin as needed.  O: BP (!) 103/52   Pulse 68   Temp 97.9 F (36.6 C) (Oral)   Resp 16   LMP 02/11/2021   SpO2 98%   FHT: 125bpm, moderate variablity, + accels, - decels Toco: q2 minutes SVE: 6/90/-2, sutures palpated, cerlcage not palpable AROM: Clear, non-odorous  A/P: 31 y.o. [id number] @ 52w4dadmitted for labor, decreased fetal movement, and 6/8 BPP.  FWB: Cat. I Labor course: Progressing naturally, AROM performed now, will add Pitocin if needed Pain: Epidural GBS: Negative Anticipate SVD  MDrema Dallas DO

## 2021-11-08 NOTE — H&P (Signed)
Lauren Leonard is a 31 y.o. female G3P1011 at 86 weeks and 4 day presenting for labor evaluation. She reports painful contractions every 2 minutes. She also reports decreased fetal movement and BPP 6 out of 8 with 2 off for breathing. Her pregnancy is complicated by cervical insufficiency. She had a rescue cerclage placed by Dr. Donalee Citrin at 20+ weeks EGA. Dr. Delora Fuel removed the knot of the cerclage in the office on 10/24/2021.  Prenatal care provided by Dr. [name].   OB History     Gravida  3   Para  1   Term  1   Preterm      AB  1   Living  1      SAB  1   IAB      Ectopic      Multiple  0   Live Births  1          Past Medical History:  Diagnosis Date   Chronic rhinitis    Colloid cyst of brain (HCC)    left foramen of menro dx 2015 due to having migraines (previously followed by neurology,  dr v. 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   History of ovarian cyst    IBS (irritable bowel syndrome)    IBS (irritable bowel syndrome)    Migraines    07-01-2020  per pt started 2015 and found to have brain colloid cyst,  currently only occasional migraines takes excedrine migraine   Mild intermittent asthma    followed by pcp   Missed ab 06/2020   Past Surgical History:  Procedure Laterality Date   CERVICAL CERCLAGE N/A 07/22/2021   Procedure: CERCLAGE CERVICAL;  Surgeon: [name], DO;  Location: [facility];  Service: Gynecology;  Laterality: N/A;   CERVICAL CONIZATION W/BX N/A 09/28/2020   Procedure: COLD KNIFE CONIZATION OF CERVIX WITH BIOPSY;  Surgeon: [name], DO;  Location: [facility];  Service: Gynecology;  Laterality: N/A;   DILATION AND EVACUATION N/A 07/08/2020   Procedure: DILATATION AND SUCTION CURETTAGE UNDER ULTRASOUND GUIDANCE.;  Surgeon: [name], DO;   Location: [facility];  Service: Gynecology;  Laterality: N/A;   LAPAROSCOPIC CHOLECYSTECTOMY  06/28/12   OPERATIVE ULTRASOUND N/A 07/08/2020   Procedure: OPERATIVE ULTRASOUND;  Surgeon: [name], DO;  Location: [facility];  Service: Gynecology;  Laterality: N/A;   Family History: family history includes Asthma in her mother; Breast cancer in her maternal grandmother; Cerebral aneurysm in an other family member; Diabetes in her maternal grandfather and maternal grandmother; Gout in her father; Hypertension in her maternal grandmother and mother; Migraines in her mother; Pancreatic cancer in her maternal grandfather; Seizures in her mother; Thyroid disease in her mother. Social History:  reports that she has never smoked. She has never used smokeless tobacco. She reports that she does not currently use alcohol. She reports that she does not use drugs.     Maternal Diabetes: No Genetic Screening: Normal Maternal Ultrasounds/Referrals: Normal Fetal Ultrasounds or other Referrals:  None Maternal Substance Abuse:  No Significant Maternal Medications:  None Significant Maternal Lab Results:  Group B Strep negative Number of Prenatal Visits:greater than 3 verified prenatal visits Other Comments:  None  Review of Systems  Constitutional: Negative.   HENT: Negative.  Eyes: Negative.   Respiratory: Negative.    Cardiovascular: Negative.   Gastrointestinal: Negative.   Endocrine: Negative.   Genitourinary: Negative.   Musculoskeletal: Negative.   Skin: Negative.   Allergic/Immunologic: Negative.   Neurological: Negative.   Hematological: Negative.   Psychiatric/Behavioral: Negative.     History Dilation: 2 Effacement (%): 80 Exam by:: Gaylan Gerold, CNM Blood pressure 129/71, pulse 64, temperature 97.6 F (36.4 C), temperature source Oral, resp. rate 19, last menstrual period 02/11/2021, SpO2 100 %. Maternal Exam:  Introitus: Normal vulva.    Physical Exam Vitals reviewed.  Constitutional:      Appearance: Normal appearance.  HENT:     Head: Normocephalic and atraumatic.     Nose: Nose normal.     Mouth/Throat:     Mouth: Mucous membranes are moist.  Cardiovascular:     Rate and Rhythm: Normal rate and regular rhythm.  Pulmonary:     Effort: Pulmonary effort is normal.     Breath sounds: Normal breath sounds.  Abdominal:     Tenderness: There is no abdominal tenderness.  Genitourinary:    General: Normal vulva.  Musculoskeletal:        General: No swelling. Normal range of motion.     Cervical back: Normal range of motion and neck supple.  Skin:    General: Skin is warm and dry.  Neurological:     General: No focal deficit present.     Mental Status: She is alert and oriented to person, place, and time.  Psychiatric:        Mood and Affect: Mood normal.        Behavior: Behavior normal.    Cervix 2/80/-2   Prenatal labs: ABO, Rh: --/--/O POS (05/05 3007) Antibody: NEG (05/05 0625) Rubella:  Immune  RPR:   Nonreactive  HBsAg:   Negative  HIV:   Negative  GBS:  Negative   Assessment/Plan: 38 weeks and 4 days in latent labor/ decreased fetal movement with BPP  6 out of 8 Cerclage knot has been removed. Residual cerclage can be felt but not seen on exam.  Planning epidural for pain management.  Will reevaluate for residual cerclage once she comfortable with her epidural.  Anticipate SVD    Christophe Louis 11/08/2021, 12:22 AM

## 2021-11-08 NOTE — Anesthesia Procedure Notes (Signed)
Epidural Patient location during procedure: OB Start time: 11/08/2021 2:40 AM End time: 11/08/2021 2:48 AM  Staffing Anesthesiologist: Suzette Battiest, MD Performed: anesthesiologist   Preanesthetic Checklist Completed: patient identified, IV checked, site marked, risks and benefits discussed, surgical consent, monitors and equipment checked, pre-op evaluation and timeout performed  Epidural Patient position: sitting Prep: DuraPrep and site prepped and draped Patient monitoring: continuous pulse ox and blood pressure Approach: midline Location: L4-L5 Injection technique: LOR air  Needle:  Needle type: Tuohy  Needle gauge: 17 G Needle length: 9 cm and 9 Needle insertion depth: 5 cm and 5.5 cm Catheter type: closed end flexible Catheter size: 19 Gauge Catheter at skin depth: 12 (10.5-->12 when sat upright) cm Test dose: negative  Assessment Events: blood not aspirated, injection not painful, no injection resistance, no paresthesia and negative IV test

## 2021-11-08 NOTE — MAU Provider Note (Signed)
Event Date/Time   First Provider Initiated Contact with Patient 11/07/21 2030     S: Ms. Lauren Leonard is a 31 y.o. G3P1011 at [redacted]w[redacted]d who presents to MAU today complaining contractions q 2-3 minutes since 0830. She was seen earlier today in MAU for this, given morphine for therapeutic rest and sent home (cervical exam was unchanged). Contractions still very strong with intense cramping + pain in her lower back and pelvis. She denies vaginal bleeding. She denies LOF. She reports decreased fetal movement. Having some nausea, has not eaten or had much to drink today because of the pain.  [place]. Had a cerclage placed by Dr. SDonalee Citrinon 07/22/21, taken out by Dr. DDelora Fuel(per pt) on 10/24/21. Per the pt, she was told Dr. DDelora Fuelcould not remove the knot but did cut underneath it which should've released the cerclage.  Pertinent items noted in HPI and remainder of comprehensive ROS otherwise negative.   O: BP 129/71 (BP Location: Right Arm)   Pulse 64   Temp 97.6 F (36.4 C) (Oral)   Resp 19   LMP 02/11/2021   SpO2 100%  GENERAL: Well-developed, well-nourished female in no acute distress.  HEAD: Normocephalic, atraumatic.  CHEST: Normal effort of breathing, regular heart rate ABDOMEN: Soft, nontender, gravid  Cervical exam:  Dilation: 2 Effacement (%): 80 Presentation: Vertex Exam by:: JGaylan Gerold CNM  Fetal Monitoring: reactive Baseline: 135 Variability: moderate Accelerations: 15x15 Decelerations: none Contractions: not tracing well, q2-35m while at bedside with pt  MAU Course/MDM: Requested to bedside by RN, when she told the pt her cervix was unchanged, the MGM became insistent that the pt be allowed to stay for pain relief, etc, stating that they have multiple insurances on file and payment is not an issue. RN explained that it is not an issue of insurance coverage but we cannot intervene if labor is latent/prodromal at  38wks. Patient and MGM expressed frustration.  To bedside to assess pt who did appear in mild distress from pain, complained of nausea, intense contractions, decreased fetal movement and just overall concern about how this is progressing. FOB expressed concern that the cerclage was not completely removed. Pt agreed to IV fluids, zofran and fentanyl prior to reassessment of her cervix. BPP ordered for decreased fetal movement.  Reassessment: cervix still 1.5-2cm but very tight, suture knot felt at the 10 o'clock position with suture across the anterior curve of the cervix. Very tight bag and head behind the cervix. BPP 6/8, polyhydramnios now present with an AFI of 26.  Called Dr. CoLandry Mellowo discuss presentation and findings, Dr. CoLandry Mellowgreed to come assess for presence of the cerclage but [facility]  A: SIUP at 3843w4datent labor with possible intact cerclage (or remaining cervix) Decreased fetal movement NST reactive  P: Admit to labor and delivery per Dr. ColLandry Mellowre turned over to Dr. ColLandry Mellow 003[facility] LPWalGabriel CarinaNM 11/08/2021 1:29 AM

## 2021-11-08 NOTE — Anesthesia Preprocedure Evaluation (Signed)
Anesthesia Evaluation  Patient identified by MRN, date of birth, ID band Patient awake    Reviewed: Allergy & Precautions, NPO status , Patient's Chart, lab work & pertinent test results  Airway Mallampati: II  TM Distance: >3 FB Neck ROM: Full    Dental  (+) Dental Advisory Given   Pulmonary asthma ,    breath sounds clear to auscultation       Cardiovascular negative cardio ROS   Rhythm:Regular Rate:Normal     Neuro/Psych  Headaches,    GI/Hepatic Neg liver ROS, GERD  ,  Endo/Other  negative endocrine ROS  Renal/GU negative Renal ROS     Musculoskeletal   Abdominal   Peds  Hematology negative hematology ROS (+)   Anesthesia Other Findings   Reproductive/Obstetrics (+) Pregnancy                             Lab Results  Component Value Date   WBC 7.8 11/07/2021   HGB 13.2 11/07/2021   HCT 39.4 11/07/2021   MCV 90.2 11/07/2021   PLT 270 11/07/2021   Lab Results  Component Value Date   CREATININE 0.72 05/26/2021   BUN <5 (L) 05/26/2021   NA 135 05/26/2021   K 3.0 (L) 05/26/2021   CL 107 05/26/2021   CO2 20 (L) 05/26/2021    Anesthesia Physical Anesthesia Plan  ASA: 2  Anesthesia Plan: Epidural   Post-op Pain Management:    Induction:   PONV Risk Score and Plan:   Airway Management Planned: Natural Airway  Additional Equipment:   Intra-op Plan:   Post-operative Plan:   Informed Consent: I have reviewed the patients History and Physical, chart, labs and discussed the procedure including the risks, benefits and alternatives for the proposed anesthesia with the patient or authorized representative who has indicated his/her understanding and acceptance.       Plan Discussed with:   Anesthesia Plan Comments:         Anesthesia Quick Evaluation

## 2021-11-08 NOTE — Lactation Note (Signed)
This note was copied from a baby's chart. Lactation Consultation Note  Patient Name: Lauren Leonard [id number] Date: 11/08/2021 Reason for consult: Initial assessment;Mother's request;Early term 37-38.6wks;Breastfeeding assistance Age:32 hours  LC assisted getting infant more depth on breast with signs of milk transfer. See flow sheet for changes made to get a deeper latch.   Plan 1. To feed based on cues 8-12x 24hr period. Birth parent to offer breasts and look for signs of milk transfer.  2. If unable to latch, offer EBM via hand expression and then latch  All questions answered at the end of the visit.  Electric pump at home.   Maternal Data Has patient been taught Hand Expression?: Yes Does the patient have breastfeeding experience prior to this delivery?: Yes How long did the patient breastfeed?: 2 children for 16 months  Feeding Mother's Current Feeding Choice: Breast Milk  LATCH Score Latch: Repeated attempts needed to sustain latch, nipple held in mouth throughout feeding, stimulation needed to elicit sucking reflex.  Audible Swallowing: Spontaneous and intermittent  Type of Nipple: Everted at rest and after stimulation  Comfort (Breast/Nipple): Soft / non-tender  Hold (Positioning): Assistance needed to correctly position infant at breast and maintain latch.  LATCH Score: 8   Lactation Tools Discussed/Used    Interventions Interventions: Breast feeding basics reviewed;Assisted with latch;Skin to skin;Breast massage;Hand express;Breast compression;Adjust position;Support pillows;Position options;Expressed milk;Education;LC Magazine features editor;Infant Driven Feeding Algorithm education  Discharge Pump: Personal WIC Program: No  Consult Status Consult Status: Follow-up Date: 11/09/21 Follow-up type: In-patient    Krishay Faro  N[place]Springer 11/08/2021, 4:54 PM

## 2021-11-08 NOTE — Progress Notes (Signed)
  Subjective: Patient is comfortable with her epidural. No lof no vaginal bleeding.   Objective: BP (!) 104/58   Pulse 69   Temp 97.9 F (36.6 C) (Oral)   Resp 18   LMP 02/11/2021   SpO2 98%  No intake/output data recorded. No intake/output data recorded.  FHT:  FHR: 130  bpm, variability: moderate,  accelerations:  Present,  decelerations:  Absent UC:   irregular, every 3-6 minutes SVE:   Dilation: 3 Effacement (%): 90 Station: -3 Exam by:: Dr. Landry Mellow  S[eciulum placed. The remaining suture can not be visualized. It is apparently buried within the cervical tissue.  Labs: Lab Results  Component Value Date   WBC 7.8 11/07/2021   HGB 13.2 11/07/2021   HCT 39.4 11/07/2021   MCV 90.2 11/07/2021   PLT 270 11/07/2021    Assessment / Plan: Latent labor admitted due to decreased fetal movement and bpp 6 out of 8  Labor:  Latent no augmentation at this time pt to be reassessed by Dr. Delora Fuel at 7 am .. If no cervical change she will decide method of augmentation.  Preeclampsia:   NA Fetal Wellbeing:  Category I Pain Control:  Epidural I/D:  n/a Anticipated MOD:  NSVD  Christophe Louis, MD 11/08/2021, 6:32 AM

## 2021-11-08 NOTE — Lactation Note (Signed)
This note was copied from a baby's chart. Lactation Consultation Note  Patient Name: Lauren Leonard EBRAX'E Date: 11/08/2021 Reason for consult: L&D Initial assessment;Early term 37-38.6wks Age:32 hours  LC in to assist with first latch to the breast.  Baby latched easily in laid back prone position.  Sat Mom more upright and pillows placed for support.    Due to history of delayed onset of volume, Mom wants to add pumping to feeding plan.  Encouraged STS with baby and offering the breast with cues.   To ask for assistance prn.  Maternal Data Has patient been taught Hand Expression?: Yes Does the patient have breastfeeding experience prior to this delivery?: Yes How long did the patient breastfeed?: 16 months  Feeding Mother's Current Feeding Choice: Breast Milk  LATCH Score Latch: Grasps breast easily, tongue down, lips flanged, rhythmical sucking.  Audible Swallowing: Spontaneous and intermittent  Type of Nipple: Everted at rest and after stimulation  Comfort (Breast/Nipple): Soft / non-tender  Hold (Positioning): Assistance needed to correctly position infant at breast and maintain latch.  LATCH Score: 9   Lactation Tools Discussed/Used    Interventions Interventions: Breast feeding basics reviewed;Assisted with latch;Skin to skin;Breast massage;Hand express;Support pillows;Position options  Discharge    Consult Status Consult Status: Follow-up from L&D Date: 11/08/21 Follow-up type: [place] 11/08/2021, 11:16 AM

## 2021-11-09 ENCOUNTER — Encounter (HOSPITAL_COMMUNITY): Payer: Self-pay | Admitting: Obstetrics and Gynecology

## 2021-11-09 LAB — CBC
HCT: 31.6 % — ABNORMAL LOW (ref 36.0–46.0)
Hemoglobin: 10.6 g/dL — ABNORMAL LOW (ref 12.0–15.0)
MCH: 30.3 pg (ref 26.0–34.0)
MCHC: 33.5 g/dL (ref 30.0–36.0)
MCV: 90.3 fL (ref 80.0–100.0)
Platelets: 204 10*3/uL (ref 150–400)
RBC: 3.5 MIL/uL — ABNORMAL LOW (ref 3.87–5.11)
RDW: 13.6 % (ref 11.5–15.5)
WBC: 8.8 10*3/uL (ref 4.0–10.5)
nRBC: 0 % (ref 0.0–0.2)

## 2021-11-09 MED ORDER — IBUPROFEN 600 MG PO TABS: 600.0000 mg | ORAL_TABLET | Freq: Four times a day (QID) | ORAL | 1 refills | Status: DC

## 2021-11-09 NOTE — Anesthesia Postprocedure Evaluation (Signed)
Anesthesia Post Note  Patient: Lauren Leonard  Procedure(s) Performed: AN AD HOC LABOR EPIDURAL     Patient location during evaluation: Mother Baby Anesthesia Type: Epidural Level of consciousness: awake and alert Pain management: pain level controlled Vital Signs Assessment: post-procedure vital signs reviewed and stable Respiratory status: spontaneous breathing, nonlabored ventilation and respiratory function stable Cardiovascular status: stable Postop Assessment: no headache, no backache and epidural receding Anesthetic complications: no   No notable events documented.  Last Vitals:  Vitals:   11/09/21 0012 11/09/21 0507  BP: 113/71 109/73  Pulse: 66 69  Resp: 18 18  Temp: 37 C 36.8 C  SpO2: 99% 97%    Last Pain:  Vitals:   11/09/21 0705  TempSrc:   PainSc: 8    Pain Goal: Patients Stated Pain Goal: 2 (11/09/21 0705)                 Barkley Boards

## 2021-11-09 NOTE — Discharge Summary (Signed)
Postpartum Discharge Summary  Date of Service: 11/09/21     Patient Name: Lauren Leonard DOB: [birth year] MRN: [id number]  Date of admission: 11/07/2021 Delivery date:11/08/2021  Delivering provider: [name]  Date of discharge: 11/09/2021  Admitting diagnosis: Prolonged latent phase of labor [O63.0] Intrauterine pregnancy: [redacted]w[redacted]d    Secondary diagnosis:  Principal Problem:   Prolonged latent phase of labor  Additional problems: Cervical shortening with cerclage placement this pregnancy, History of LEEP, History of Cold Knife Conization, Obesity (BMI 43), \Asthma (mild, intermittent), IBS-C, GERD, Desires permanent sterilization Discharge diagnosis: Term Pregnancy Delivered                                              Post partum procedures: None Augmentation: AROM Complications: None  Hospital course: Onset of Labor With Vaginal Delivery      31y.o. yo [id number] admitted in Latent Labor on 11/07/2021. Patient had an uncomplicated labor course as follows:  Membrane Rupture Time/Date: 7:57 AM ,11/08/2021   Delivery Method:Vaginal, Spontaneous  Episiotomy: None  Lacerations:  2nd degree  Patient had an uncomplicated postpartum course.  She is ambulating, tolerating a regular diet, passing flatus, and urinating well. Patient is discharged home in stable condition on 11/09/21.  Newborn Data: Birth date:11/08/2021  Birth time:10:35 AM  Gender:Female  Living status:Living  Apgars:9 ,10  Weight:3210 g   Magnesium Sulfate received: No BMZ received: No Rhophylac:N/A MMR:N/A T-DaP:Given prenatally Flu: N/A Transfusion:No  Physical exam  Vitals:   11/08/21 1816 11/08/21 2200 11/09/21 0012 11/09/21 0507  BP: 106/79 112/60 113/71 109/73  Pulse: 62 73 66 69  Resp: _0 Temp: 98 F (36.7 C) 98.5 F (36.9 C) 98.6 F (37 C) 98.3 F (36.8 C)  TempSrc: Oral Oral Oral Oral  SpO2: 100% 98% 99% 97%   General: alert, cooperative, and no  distress Lochia: appropriate Uterine Fundus: firm Incision: N/A DVT Evaluation: No evidence of DVT seen on physical exam. No cords or calf tenderness. Labs: Lab Results  Component Value Date   WBC 8.8 11/09/2021   HGB 10.6 (L) 11/09/2021   HCT 31.6 (L) 11/09/2021   MCV 90.3 11/09/2021   PLT 204 11/09/2021      Latest Ref Rng & Units 05/26/2021    8:15 AM  CMP  Glucose 70 - 99 mg/dL 84   BUN 6 - 20 mg/dL <5   Creatinine 0.44 - 1.00 mg/dL 0.72   Sodium 135 - 145 mmol/L 135   Potassium 3.5 - 5.1 mmol/L 3.0   Chloride 98 - 111 mmol/L 107   CO2 22 - 32 mmol/L 20   Calcium 8.9 - 10.3 mg/dL 8.1    Edinburgh Score:    11/08/2021    2:00 PM  Edinburgh Postnatal Depression Scale Screening Tool  I have been able to laugh and see the funny side of things. 0  I have looked forward with enjoyment to things. 0  I have blamed myself unnecessarily when things went wrong. 1  I have been anxious or worried for no good reason. 1  I have felt scared or panicky for no good reason. 2  Things have been getting on top of me. 1  I have been so unhappy that I have had difficulty sleeping. 0  I have felt sad or miserable. 0  I have been so  unhappy that I have been crying. 0  The thought of harming myself has occurred to me. 0  Edinburgh Postnatal Depression Scale Total 5      After visit meds:  Allergies as of 11/09/2021       Reactions   Zoloft [sertraline]    Makes anxious   Citric Acid Rash   Breakouts on chest and back.   Latex Rash, Other (See Comments)   Itches, breaks my hands out, burning        Medication List     STOP taking these medications    cetirizine 10 MG tablet Commonly known as: ZYRTEC   fexofenadine 180 MG tablet Commonly known as: ALLEGRA   ondansetron 4 MG disintegrating tablet Commonly known as: ZOFRAN-ODT   promethazine 25 MG tablet Commonly known as: PHENERGAN   pyridOXINE 25 MG tablet Commonly known as: VITAMIN B6       TAKE these  medications    albuterol 108 (90 Base) MCG/ACT inhaler Commonly known as: VENTOLIN HFA Inhale 2 puffs into the lungs every 6 (six) hours as needed for wheezing or shortness of breath.   diphenhydrAMINE 25 MG tablet Commonly known as: BENADRYL Take 25 mg by mouth at bedtime as needed for allergies.   ibuprofen 600 MG tablet Commonly known as: ADVIL Take 1 tablet (600 mg total) by mouth every 6 (six) hours.   ipratropium 0.06 % nasal spray Commonly known as: ATROVENT Place 1 spray into both nostrils in the morning and at bedtime.   multivitamin-prenatal 27-0.8 MG Tabs tablet Take 1 tablet by mouth in the morning.   pantoprazole 40 MG tablet Commonly known as: PROTONIX Take 40 mg by mouth daily before breakfast.   VITAMIN C PO Take 1 tablet by mouth in the morning.   VITAMIN D3 PO Take 1 tablet by mouth daily.         Discharge home in stable condition Infant Feeding: Bottle and Breast Infant Disposition:home with mother Discharge instruction: per After Visit Summary and Postpartum booklet. Activity: Advance as tolerated. Pelvic rest for 6 weeks.  Diet: routine diet Anticipated Birth Control:  Interval bilateral salpingectomy Postpartum Appointment:6 weeks Additional Postpartum F/U:  None Future Appointments:No future appointments. Follow up Visit:  Follow-up Information     , , DO Follow up in 6 week(s).   Specialty: Obstetrics and Gynecology Why: Our office will call to arrange your 6 week postpartum visit. Contact information: [address] [place] [address] [phone]                     11/09/2021  , DO   

## 2021-11-09 NOTE — Progress Notes (Signed)
Attempted to round on patient - patient is in the shower. Will return to assess for possible discharge home today.  [name], DO

## 2021-11-09 NOTE — Progress Notes (Signed)
Pt requests to wait for AM lab draw until baby wakes up. Instructed pt to call when baby awakens.

## 2021-11-09 NOTE — Social Work (Signed)
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  CSW met with MOB to offer support and complete assessment. CSW entered the room, introduced self , CSW role an reason for visit. MOB agreeable to visit. MOB granted verbal permission to speak in front of her mom and FOB. CSW inquired about how MOB was feeling, MOB reported she was in pain and felt " like crap". CSW actively listened while MOB expressed her feelings about her labor and delivery process. CSW inquired about MOB's noted PPD. MOB explained she experienced PPD from a few weeks after her son was born up until he was 62 months. MOB reported she was offereed medication but declined. CSW inquired about therapy, MOB reported she  began to see a therapist during that time. MOB reported she is currently active with [facility] and plans to follow up postpartum. CSW assessed for safety, MOB denied any SI or HI. MOB identified her mom and FOB as her supports.   CSW provided education regarding the baby blues period vs. perinatal mood disorders, discussed treatment and gave resources for mental health follow up if concerns arise.  CSW recommends self-evaluation during the postpartum time period using the New Mom Checklist from Postpartum Progress and encouraged MOB to contact a medical professional if symptoms are noted at any time.    CSW provided review of Sudden Infant Death Syndrome (SIDS) precautions. MOB identified [facility] for infants follow up care. MOB reported they have all necessary items for the infant, including a crib for the infant to sleep.   CSW identifies no further need for intervention and no barriers to discharge at this time.  Letta Kocher, [place] Social Worker [phone]

## 2021-11-14 ENCOUNTER — Inpatient Hospital Stay (HOSPITAL_COMMUNITY)
Admission: AD | Admit: 2021-11-14 | Payer: BC Managed Care – PPO | Source: Home / Self Care | Admitting: Obstetrics and Gynecology

## 2021-11-14 ENCOUNTER — Inpatient Hospital Stay (HOSPITAL_COMMUNITY): Payer: BC Managed Care – PPO

## 2021-11-16 ENCOUNTER — Telehealth (HOSPITAL_COMMUNITY): Payer: Self-pay | Admitting: *Deleted

## 2021-11-16 NOTE — Telephone Encounter (Signed)
Mom reports feeling fine. No concerns about herself at this time. EPDS not completed as mom is not at home presently. [facility] score=5) Mom reports baby is doing well. Feeding, peeing, and pooping without difficulty. Safe sleep reviewed. Mom reports no concerns about baby at present.  Odis Hollingshead, RN 11-16-2021 at 1:20pm

## 2022-01-14 NOTE — H&P (Signed)
Lauren Leonard is an 31 y.o. [phone] who is admitted for laparoscopic bilateral salpingectomy due to desire for permanent sterilization.  Patient is 100% positive that she no longer desires future fertility. She declines any hormonal contraceptives.  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   History of ovarian cyst    IBS (irritable bowel syndrome)    IBS (irritable bowel syndrome)    Migraines    07-01-2020  per pt started 2015 and found to have brain colloid cyst,  currently only occasional migraines takes excedrine migraine   Mild intermittent asthma    followed by pcp   Missed ab 06/2020    Past Surgical History:  Procedure Laterality Date   CERVICAL CERCLAGE N/A 07/22/2021   Procedure: CERCLAGE CERVICAL;  Surgeon: [name], DO;  Location: [facility];  Service: Gynecology;  Laterality: N/A;   CERVICAL CONIZATION W/BX N/A 09/28/2020   Procedure: COLD KNIFE CONIZATION OF CERVIX WITH BIOPSY;  Surgeon: [name], DO;  Location: [place];  Service: Gynecology;  Laterality: N/A;   DILATION AND EVACUATION N/A 07/08/2020   Procedure: DILATATION AND SUCTION CURETTAGE UNDER ULTRASOUND GUIDANCE.;  Surgeon: [name], DO;  Location: [place];  Service: Gynecology;  Laterality: N/A;   LAPAROSCOPIC CHOLECYSTECTOMY  06/28/12   OPERATIVE ULTRASOUND N/A 07/08/2020   Procedure: OPERATIVE ULTRASOUND;  Surgeon: [name], DO;  Location: [place];  Service: Gynecology;  Laterality: N/A;    Family History  Problem Relation Age of Onset   Diabetes Maternal Grandmother    Hypertension Maternal Grandmother    Breast cancer Maternal Grandmother    Diabetes Maternal Grandfather    Pancreatic cancer Maternal Grandfather    Migraines Mother    Hypertension Mother    Asthma Mother    Seizures Mother    Thyroid disease Mother    Gout Father    Cerebral aneurysm Other     Social History:  reports that she has never smoked. She has never used smokeless tobacco. She reports that she does not currently use alcohol. She reports that she does not use drugs.  ALLERGIES/MEDS:  Allergies:  Allergies  Allergen Reactions   Zoloft [Sertraline]     Makes anxious   Citric Acid Rash    Breakouts on chest and back.   Latex Rash and Other (See Comments)    Itches, breaks my hands out, burning    No medications prior to admission.     Review of Systems  Constitutional: Negative.   HENT: Negative.  Eyes: Negative.   Respiratory: Negative.    Cardiovascular: Negative.   Gastrointestinal: Negative.   Skin: Negative.   Neurological: Negative.   Endo/Heme/Allergies: Negative.   Psychiatric/Behavioral: Negative.      Last menstrual period 02/11/2021, unknown if currently breastfeeding. Gen:  NAD, pleasant and cooperative Cardio:  RRR Pulm:  CTAB, no wheezes/rales/rhonchi Abd:  Soft, non-distended, non-tender throughout, no rebound/guarding Ext:  No bilateral LE edema, no bilateral calf  tenderness  No results found for this or any previous visit (from the past 24 hour(s)).  No results found.   ASSESSMENT/PLAN: Lauren Leonard is a 31 y.o. [phone] who is admitted for laparoscopic bilateral salpingectomy due to desire for permanent sterilization.  - Admit to [facility] - Admit labs (CBC, T&S, COVID screen per protocol, urine HCG) - Diet:  Per anesthesia - IVF:  Per anesthesia - VTE Prophylaxis:  SCDs -  D/C home same-day  Consents: I have discussed with the patient that this surgery is performed to provide permanent female sterilization by removing the entirety of each fallopian tube.  Prior to surgery, the risks and benefits of the surgery, as well as alternative treatments, have been discussed.  The risks of proceeding with this surgery include, but are not limited to, bleeding, including the need for a blood transfusion, infection, damage to surrounding organs and tissues, damage to bladder, damage to ureters, causing kidney damage, and requiring additional procedures, damage to bowels, resulting in further surgery, postoperative pain, short-term and long-term, scarring on the abdominal wall and intra-abdominally, need for further surgery, development of an incisional hernia, need for an open procedure, deep vein thrombosis and/or pulmonary embolism, wound infection and/or separation, painful intercourse, failure of the procedure to prevent pregnancy, increased risk of ectopic pregnancy requiring surgery if procedure failure occurs complications the course of which cannot be predicted or prevented, and death. Patient was counseled on the risks, benefits and alternatives of bilateral tubal sterilization.  She understands that there is a low risk of failure and if she were to get pregnant after a bilateral tubal sterilization, there is a 50% chance of ectopic pregnancy.  She was advised that if she should miss her period and/or have a positive pregnancy test after her bilateral  tubal sterilization, she should seek medical assistance. She understands that this is a permanent procedure and cannot be reversed.  Patient was consented for blood products.  The patient is aware that bleeding may result in the need for a blood transfusion which includes risk of transmission of HIV (1:2 million), Hepatitis C (1:2 million), and Hepatitis B (1:200 thousand) and transfusion reaction.  Patient voiced understanding of the above risks as well as understanding of indications for blood transfusion.   [name], DO

## 2022-01-21 ENCOUNTER — Emergency Department (HOSPITAL_COMMUNITY): Payer: BC Managed Care – PPO

## 2022-01-21 ENCOUNTER — Encounter (HOSPITAL_COMMUNITY): Payer: Self-pay | Admitting: Emergency Medicine

## 2022-01-21 ENCOUNTER — Emergency Department (HOSPITAL_COMMUNITY)
Admission: EM | Admit: 2022-01-21 | Discharge: 2022-01-22 | Discharge status: Against Medical Advice | Payer: BC Managed Care – PPO | Attending: Emergency Medicine | Admitting: Emergency Medicine

## 2022-01-21 DIAGNOSIS — R112 Nausea with vomiting, unspecified: Secondary | ICD-10-CM | POA: Insufficient documentation

## 2022-01-21 DIAGNOSIS — R0602 Shortness of breath: Secondary | ICD-10-CM | POA: Insufficient documentation

## 2022-01-21 DIAGNOSIS — Z5321 Procedure and treatment not carried out due to patient leaving prior to being seen by health care provider: Secondary | ICD-10-CM | POA: Diagnosis not present

## 2022-01-21 LAB — CBC WITH DIFFERENTIAL/PLATELET
Abs Immature Granulocytes: 0.04 10*3/uL (ref 0.00–0.07)
Basophils Absolute: 0.1 10*3/uL (ref 0.0–0.1)
Basophils Relative: 1 %
Eosinophils Absolute: 0.2 10*3/uL (ref 0.0–0.5)
Eosinophils Relative: 2 %
HCT: 39.8 % (ref 36.0–46.0)
Hemoglobin: 13.4 g/dL (ref 12.0–15.0)
Immature Granulocytes: 0 %
Lymphocytes Relative: 46 %
Lymphs Abs: 4.1 10*3/uL — ABNORMAL HIGH (ref 0.7–4.0)
MCH: 30.5 pg (ref 26.0–34.0)
MCHC: 33.7 g/dL (ref 30.0–36.0)
MCV: 90.5 fL (ref 80.0–100.0)
Monocytes Absolute: 0.7 10*3/uL (ref 0.1–1.0)
Monocytes Relative: 8 %
Neutro Abs: 3.9 10*3/uL (ref 1.7–7.7)
Neutrophils Relative %: 43 %
Platelets: 366 10*3/uL (ref 150–400)
RBC: 4.4 MIL/uL (ref 3.87–5.11)
RDW: 13.6 % (ref 11.5–15.5)
WBC: 9 10*3/uL (ref 4.0–10.5)
nRBC: 0 % (ref 0.0–0.2)

## 2022-01-21 LAB — COMPREHENSIVE METABOLIC PANEL
ALT: 42 U/L (ref 0–44)
AST: 33 U/L (ref 15–41)
Albumin: 3.9 g/dL (ref 3.5–5.0)
Alkaline Phosphatase: 99 U/L (ref 38–126)
Anion gap: 17 — ABNORMAL HIGH (ref 5–15)
BUN: 13 mg/dL (ref 6–20)
CO2: 21 mmol/L — ABNORMAL LOW (ref 22–32)
Calcium: 9.6 mg/dL (ref 8.9–10.3)
Chloride: 101 mmol/L (ref 98–111)
Creatinine, Ser: 0.89 mg/dL (ref 0.44–1.00)
GFR, Estimated: >60 mL/min (ref >60)
Glucose, Bld: 158 mg/dL — ABNORMAL HIGH (ref 70–99)
Potassium: 2.8 mmol/L — ABNORMAL LOW (ref 3.5–5.1)
Sodium: 139 mmol/L (ref 135–145)
Total Bilirubin: 0.6 mg/dL (ref 0.3–1.2)
Total Protein: 7.2 g/dL (ref 6.5–8.1)

## 2022-01-21 LAB — URINALYSIS, ROUTINE W REFLEX MICROSCOPIC
Bilirubin Urine: NEGATIVE
Glucose, UA: NEGATIVE mg/dL
Hgb urine dipstick: NEGATIVE
Ketones, ur: NEGATIVE mg/dL
Leukocytes,Ua: NEGATIVE
Nitrite: NEGATIVE
Protein, ur: NEGATIVE mg/dL
Specific Gravity, Urine: 1.014 (ref 1.005–1.030)
pH: 5 (ref 5.0–8.0)

## 2022-01-21 LAB — LIPASE, BLOOD: Lipase: 32 U/L (ref 11–51)

## 2022-01-21 MED ORDER — LORAZEPAM 1 MG PO TABS
1.0000 mg | ORAL_TABLET | Freq: Once | ORAL | Status: AC
  Administered 2022-01-21: 1 mg via ORAL
  Filled 2022-01-21: qty 1

## 2022-01-21 MED ORDER — ONDANSETRON 4 MG PO TBDP
4.0000 mg | ORAL_TABLET | Freq: Once | ORAL | Status: AC
  Administered 2022-01-21: 4 mg via ORAL
  Filled 2022-01-21: qty 1

## 2022-01-21 NOTE — ED Triage Notes (Signed)
Pt brought to ED by significant other stating that she ingested THC candy unknowingly.

## 2022-01-21 NOTE — ED Provider Triage Note (Cosign Needed Addendum)
Emergency Medicine Provider Triage Evaluation Note  Lauren Leonard , a 31 y.o. female  was evaluated in triage.  Pt complains of nausea, vomiting and shortness of breath.  Vomiting started around 9 PM.  Patient has had 2 episodes.  This started after eating a chocolate bar that had THC in it.  She feels like her asthma is flaring up, but her albuterol is not helping.  She feels dizzy.  No chest pain.  10:31 PM Pt with hyperventilation, lungs still clear. Seems very anxious. I have ordered ativan '1mg'$  PO.   Review of Systems  Positive: Dizziness, vomiting, shortness of breath Negative: Fever  Physical Exam  LMP 02/11/2021  Gen:   Awake, no distress   Resp:  Normal effort, lungs clear to auscultation bilaterally without wheezing MSK:   Moves extremities without difficulty  Other:  Abdomen soft and nontender  Medical Decision Making  Medically screening exam initiated at 10:14 PM.  Appropriate orders placed.  Lauren Leonard was informed that the remainder of the evaluation will be completed by another provider, this initial triage assessment does not replace that evaluation, and the importance of remaining in the ED until their evaluation is complete.     Carlisle Cater, PA-C 01/21/22 2215    Carlisle Cater, PA-C 01/21/22 2232

## 2022-01-22 LAB — I-STAT BETA HCG BLOOD, ED (MC, WL, AP ONLY): I-stat hCG, quantitative: <5 m[IU]/mL (ref <5)

## 2022-01-22 NOTE — ED Notes (Signed)
NA x3 vitals

## 2022-01-23 ENCOUNTER — Encounter (HOSPITAL_BASED_OUTPATIENT_CLINIC_OR_DEPARTMENT_OTHER): Payer: Self-pay | Admitting: Obstetrics and Gynecology

## 2022-01-23 NOTE — Progress Notes (Signed)
Spoke w/ via phone for pre-op interview--- pt Lab needs dos----  cbc, t&s, urine preg             Lab results------ no COVID test -----patient states asymptomatic no test needed Arrive at ------- 0915 on 01-25-2022 NPO after MN NO Solid Food.  Clear liquids from MN until--- 0815 Med rec completed Medications to take morning of surgery ----- protonix Diabetic medication ----- n/a Patient instructed no nail polish to be worn day of surgery Patient instructed to bring photo id and insurance card day of surgery Patient aware to have Driver (ride ) / caregiver for 24 hours after surgery --- father, amos Patient Special Instructions ----- asked to bring rescue inhaler dos Pre-Op special Istructions ----- n/a Patient verbalized understanding of instructions that were given at this phone interview. Patient denies shortness of breath, chest pain, fever, cough at this phone interview.

## 2022-01-25 ENCOUNTER — Encounter (HOSPITAL_BASED_OUTPATIENT_CLINIC_OR_DEPARTMENT_OTHER)
Admission: RE | Disposition: A | Discharge status: Home / Self Care | Payer: Self-pay | Source: Ambulatory Visit | Attending: Obstetrics and Gynecology

## 2022-01-25 ENCOUNTER — Ambulatory Visit (HOSPITAL_BASED_OUTPATIENT_CLINIC_OR_DEPARTMENT_OTHER)
Admission: RE | Admit: 2022-01-25 | Discharge: 2022-01-25 | Disposition: A | Discharge status: Home / Self Care | Payer: BC Managed Care – PPO | Source: Ambulatory Visit | Attending: Obstetrics and Gynecology | Admitting: Obstetrics and Gynecology

## 2022-01-25 ENCOUNTER — Other Ambulatory Visit: Payer: Self-pay

## 2022-01-25 ENCOUNTER — Ambulatory Visit (HOSPITAL_BASED_OUTPATIENT_CLINIC_OR_DEPARTMENT_OTHER): Payer: BC Managed Care – PPO | Admitting: Anesthesiology

## 2022-01-25 ENCOUNTER — Encounter (HOSPITAL_BASED_OUTPATIENT_CLINIC_OR_DEPARTMENT_OTHER): Payer: Self-pay | Admitting: Obstetrics and Gynecology

## 2022-01-25 DIAGNOSIS — Z302 Encounter for sterilization: Secondary | ICD-10-CM | POA: Insufficient documentation

## 2022-01-25 HISTORY — PX: LAPAROSCOPIC BILATERAL SALPINGECTOMY: SHX5889

## 2022-01-25 HISTORY — DX: Irritable bowel syndrome with constipation: K58.1

## 2022-01-25 LAB — CBC
HCT: 39.2 % (ref 36.0–46.0)
Hemoglobin: 12.8 g/dL (ref 12.0–15.0)
MCH: 29.9 pg (ref 26.0–34.0)
MCHC: 32.7 g/dL (ref 30.0–36.0)
MCV: 91.6 fL (ref 80.0–100.0)
Platelets: 291 10*3/uL (ref 150–400)
RBC: 4.28 MIL/uL (ref 3.87–5.11)
RDW: 13.8 % (ref 11.5–15.5)
WBC: 4.3 10*3/uL (ref 4.0–10.5)
nRBC: 0 % (ref 0.0–0.2)

## 2022-01-25 LAB — TYPE AND SCREEN
ABO/RH(D): O POS
Antibody Screen: NEGATIVE

## 2022-01-25 LAB — POCT PREGNANCY, URINE: Preg Test, Ur: NEGATIVE

## 2022-01-25 SURGERY — SALPINGECTOMY, BILATERAL, LAPAROSCOPIC
Anesthesia: General | Site: Abdomen | Laterality: Bilateral

## 2022-01-25 MED ORDER — PROPOFOL 10 MG/ML IV BOLUS
INTRAVENOUS | Status: DC | PRN
  Administered 2022-01-25: 200 mg via INTRAVENOUS

## 2022-01-25 MED ORDER — PROPOFOL 10 MG/ML IV BOLUS
INTRAVENOUS | Status: AC
  Filled 2022-01-25: qty 20

## 2022-01-25 MED ORDER — MIDAZOLAM HCL 5 MG/5ML IJ SOLN
INTRAMUSCULAR | Status: DC | PRN
  Administered 2022-01-25: 2 mg via INTRAVENOUS

## 2022-01-25 MED ORDER — MIDAZOLAM HCL 2 MG/2ML IJ SOLN
INTRAMUSCULAR | Status: AC
  Filled 2022-01-25: qty 2

## 2022-01-25 MED ORDER — IBUPROFEN 800 MG PO TABS: 800.0000 mg | ORAL_TABLET | Freq: Three times a day (TID) | ORAL | 1 refills | Status: AC | PRN

## 2022-01-25 MED ORDER — OXYCODONE HCL 5 MG PO TABS: 5.0000 mg | ORAL_TABLET | Freq: Four times a day (QID) | ORAL | 0 refills | Status: AC | PRN

## 2022-01-25 MED ORDER — ACETAMINOPHEN 500 MG PO TABS
ORAL_TABLET | ORAL | Status: AC
  Filled 2022-01-25: qty 2

## 2022-01-25 MED ORDER — ROCURONIUM BROMIDE 100 MG/10ML IV SOLN
INTRAVENOUS | Status: DC | PRN
  Administered 2022-01-25: 60 mg via INTRAVENOUS

## 2022-01-25 MED ORDER — FENTANYL CITRATE (PF) 100 MCG/2ML IJ SOLN
INTRAMUSCULAR | Status: AC
  Filled 2022-01-25: qty 2

## 2022-01-25 MED ORDER — BUPIVACAINE HCL (PF) 0.25 % IJ SOLN
INTRAMUSCULAR | Status: DC | PRN
  Administered 2022-01-25: 30 mL

## 2022-01-25 MED ORDER — SUGAMMADEX SODIUM 200 MG/2ML IV SOLN
INTRAVENOUS | Status: DC | PRN
  Administered 2022-01-25: 200 mg via INTRAVENOUS

## 2022-01-25 MED ORDER — DEXAMETHASONE SODIUM PHOSPHATE 4 MG/ML IJ SOLN
INTRAMUSCULAR | Status: DC | PRN
  Administered 2022-01-25: 5 mg via INTRAVENOUS

## 2022-01-25 MED ORDER — DEXAMETHASONE SODIUM PHOSPHATE 10 MG/ML IJ SOLN
INTRAMUSCULAR | Status: AC
  Filled 2022-01-25: qty 1

## 2022-01-25 MED ORDER — SIMETHICONE 80 MG PO CHEW
CHEWABLE_TABLET | ORAL | Status: AC
  Filled 2022-01-25: qty 1

## 2022-01-25 MED ORDER — KETOROLAC TROMETHAMINE 30 MG/ML IJ SOLN
INTRAMUSCULAR | Status: AC
  Filled 2022-01-25: qty 1

## 2022-01-25 MED ORDER — HYDROMORPHONE HCL 1 MG/ML IJ SOLN
0.2500 mg | INTRAMUSCULAR | Status: DC | PRN
  Administered 2022-01-25 (×5): 0.5 mg via INTRAVENOUS

## 2022-01-25 MED ORDER — ROCURONIUM BROMIDE 10 MG/ML (PF) SYRINGE
PREFILLED_SYRINGE | INTRAVENOUS | Status: AC
  Filled 2022-01-25: qty 10

## 2022-01-25 MED ORDER — HYDROMORPHONE HCL 1 MG/ML IJ SOLN
INTRAMUSCULAR | Status: AC
  Filled 2022-01-25: qty 1

## 2022-01-25 MED ORDER — LACTATED RINGERS IV SOLN: INTRAVENOUS | Status: DC

## 2022-01-25 MED ORDER — LIDOCAINE HCL (PF) 2 % IJ SOLN
INTRAMUSCULAR | Status: AC
  Filled 2022-01-25: qty 5

## 2022-01-25 MED ORDER — ONDANSETRON HCL 4 MG/2ML IJ SOLN
INTRAMUSCULAR | Status: AC
  Filled 2022-01-25: qty 2

## 2022-01-25 MED ORDER — ONDANSETRON HCL 4 MG/2ML IJ SOLN
INTRAMUSCULAR | Status: DC | PRN
  Administered 2022-01-25: 4 mg via INTRAVENOUS

## 2022-01-25 MED ORDER — LIDOCAINE HCL (CARDIAC) PF 100 MG/5ML IV SOSY
PREFILLED_SYRINGE | INTRAVENOUS | Status: DC | PRN
  Administered 2022-01-25: 100 mg via INTRAVENOUS

## 2022-01-25 MED ORDER — ACETAMINOPHEN 500 MG PO TABS
1000.0000 mg | ORAL_TABLET | ORAL | Status: AC
  Administered 2022-01-25: 1000 mg via ORAL

## 2022-01-25 MED ORDER — FENTANYL CITRATE (PF) 100 MCG/2ML IJ SOLN
INTRAMUSCULAR | Status: DC | PRN
  Administered 2022-01-25 (×2): 50 ug via INTRAVENOUS

## 2022-01-25 MED ORDER — SIMETHICONE 80 MG PO CHEW
80.0000 mg | CHEWABLE_TABLET | Freq: Four times a day (QID) | ORAL | Status: DC | PRN
  Administered 2022-01-25: 80 mg via ORAL

## 2022-01-25 MED ORDER — KETOROLAC TROMETHAMINE 30 MG/ML IJ SOLN
INTRAMUSCULAR | Status: DC | PRN
  Administered 2022-01-25: 30 mg via INTRAVENOUS

## 2022-01-25 MED ORDER — SILVER NITRATE-POT NITRATE 75-25 % EX MISC
CUTANEOUS | Status: DC | PRN
  Administered 2022-01-25: 2 via TOPICAL

## 2022-01-25 MED ORDER — 0.9 % SODIUM CHLORIDE (POUR BTL) OPTIME
TOPICAL | Status: DC | PRN
  Administered 2022-01-25: 500 mL

## 2022-01-25 SURGICAL SUPPLY — 31 items
ADH SKN CLS APL DERMABOND .7 (GAUZE/BANDAGES/DRESSINGS) ×1
APL SRG 38 LTWT LNG FL B (MISCELLANEOUS)
APPLICATOR ARISTA FLEXITIP XL (MISCELLANEOUS) IMPLANT
CABLE HIGH FREQUENCY MONO STRZ (ELECTRODE) IMPLANT
DERMABOND ADVANCED .7 DNX12 (GAUZE/BANDAGES/DRESSINGS) IMPLANT
DRSG OPSITE POSTOP 3X4 (GAUZE/BANDAGES/DRESSINGS) IMPLANT
GAUZE 4X4 16PLY ~~LOC~~+RFID DBL (SPONGE) ×1 IMPLANT
GLOVE BIO SURGEON STRL SZ 6.5 (GLOVE) ×2 IMPLANT
GLOVE BIOGEL PI IND STRL 6.5 (GLOVE) ×1 IMPLANT
GLOVE BIOGEL PI IND STRL 7.0 (GLOVE) ×2 IMPLANT
GOWN STRL REUS W/TWL LRG LVL3 (GOWN DISPOSABLE) ×2 IMPLANT
HEMOSTAT ARISTA ABSORB 3G PWDR (HEMOSTASIS) IMPLANT
KIT TURNOVER CYSTO (KITS) ×1 IMPLANT
LIGASURE VESSEL 5MM BLUNT TIP (ELECTROSURGICAL) IMPLANT
NS IRRIG 1000ML POUR BTL (IV SOLUTION) ×1 IMPLANT
PACK LAPAROSCOPY BASIN (CUSTOM PROCEDURE TRAY) ×1 IMPLANT
PACK TRENDGUARD 450 HYBRID PRO (MISCELLANEOUS) IMPLANT
PROTECTOR NERVE ULNAR (MISCELLANEOUS) ×2 IMPLANT
SET SUCTION IRRIG HYDROSURG (IRRIGATION / IRRIGATOR) ×1 IMPLANT
SET TUBE SMOKE EVAC HIGH FLOW (TUBING) ×1 IMPLANT
SLEEVE ADV FIXATION 5X100MM (TROCAR) ×1 IMPLANT
SOLUTION ELECTROLUBE (MISCELLANEOUS) IMPLANT
SUT VICRYL 0 UR6 27IN ABS (SUTURE) IMPLANT
SUT VICRYL 4-0 PS2 18IN ABS (SUTURE) ×1 IMPLANT
SYS BAG RETRIEVAL 10MM (BASKET)
SYSTEM BAG RETRIEVAL 10MM (BASKET) IMPLANT
TRAY FOLEY W/BAG SLVR 14FR (SET/KITS/TRAYS/PACK) ×1 IMPLANT
TRENDGUARD 450 HYBRID PRO PACK (MISCELLANEOUS)
TROCAR Z-THREAD FIOS 11X100 BL (TROCAR) IMPLANT
TROCAR Z-THREAD FIOS 5X100MM (TROCAR) ×1 IMPLANT
WARMER LAPAROSCOPE (MISCELLANEOUS) ×1 IMPLANT

## 2022-01-25 NOTE — Progress Notes (Signed)
Called Dr Nyoka Cowden about patient's pain she gave orders for Tylenol 1 gram.  Dr Nyoka Cowden did not give order for oxyvodone to go home with RN was told to call Surgeon.

## 2022-01-25 NOTE — Op Note (Signed)
Pre Op Dx:   Desires permanent sterilization  Post Op Dx:   Same as pre-op  Procedure:   Laparoscopic bilateral salpingectomy   Surgeon:  Dr. [name] Assistants:  Gaylord Shih, RNFA Anesthesia:  General   EBL:  5cc  IVF:  See anesthesia documentation UOP:  400cc clear yellow urine  Drains:  Foley catheter Specimen removed:  Bilateral fallopian tubes - sent to pathology Device(s) implanted: None Case Type:  Clean-contaminated Findings:  Normal-appearing uterus, bilateral fallopian tubes, and ovaries. Normal-appearing liver contour. Complications: None Indications:  31 y.o. [id number] who desires permanent sterilization.  Description of each procedure:  After informed consent the patient was taken to the operating room where general endotracheal anesthesia was administered and found to be adequate. She was placed in dorsal lithotomy position. She was prepped and draped in the usual sterile fashion. An operative timeout was performed. A Hulka tenaculum was placed in the cervix and her bladder was drained. The abdomen was entered through an intraumbilical incision using a 54m  trocar under direct visualization and a pneumoperitoneum was established. Bilateral lower quadrant 548mports were placed under direct visualization and atraumatic entry confirmed. The right fallopian tube was divided from the mesosalpinx using the Ligasure device. Adequate hemostasis noted. This process was completed on the contralateral side. Hemostasis confirmed. The bilateral quadrant ports were withdrawn under visualization and the pneumoperitoneum was reduced completely, assisted with the patient having two deep breaths. The camera port was then withdrawn under visualization. The skin was closed with 4-0 Vicryl in subcuticular fashion. The tenaculum was removed and cervical hemostasis confirmed. The Hulka Tenaculum was removed and silver nitrate applied to the tenaculum site. Foley catheter removed. The patient  was returned to dorsal supine position, awakened and extubated in the OR having appeared to tolerate the procedure well. All sponge, needle, and instrument counts were correct x 2 at the end of the case.   Disposition:  PACU  MeDrema DallasDO

## 2022-01-25 NOTE — Progress Notes (Signed)
Spoke with Dr Delora Fuel about pain control, patient states its still an 8.  VS stable abdomen soft.  Patient able to ambulate over to phase II.

## 2022-01-25 NOTE — Interval H&P Note (Signed)
History and Physical Interval Note:  01/25/2022 10:51 AM  Lauren Leonard  has presented today for surgery, with the diagnosis of Sterilization.  The various methods of treatment have been discussed with the patient and family. After consideration of risks, benefits and other options for treatment, the patient has consented to  Procedure(s): LAPAROSCOPIC BILATERAL SALPINGECTOMY (Bilateral) as a surgical intervention.  The patient's history has been reviewed, patient examined, no change in status, stable for surgery.  I have reviewed the patient's chart and labs.  Questions were answered to the patient's satisfaction.     [name]

## 2022-01-25 NOTE — Anesthesia Preprocedure Evaluation (Addendum)
Anesthesia Evaluation  Patient identified by MRN, date of birth, ID band Patient awake    Reviewed: Allergy & Precautions, NPO status , Patient's Chart, lab work & pertinent test results  Airway Mallampati: II       Dental   Pulmonary asthma    breath sounds clear to auscultation       Cardiovascular negative cardio ROS  Rhythm:Regular Rate:Normal     Neuro/Psych  Headaches    GI/Hepatic Neg liver ROS,GERD  ,,  Endo/Other  negative endocrine ROS    Renal/GU negative Renal ROS     Musculoskeletal   Abdominal   Peds  Hematology   Anesthesia Other Findings   Reproductive/Obstetrics                             Anesthesia Physical Anesthesia Plan  ASA: 2  Anesthesia Plan:    Post-op Pain Management: Tylenol PO (pre-op)*   Induction:   PONV Risk Score and Plan: 2 and Ondansetron, Dexamethasone and Midazolam  Airway Management Planned: Oral ETT  Additional Equipment:   Intra-op Plan:   Post-operative Plan: Extubation in OR  Informed Consent: I have reviewed the patients History and Physical, chart, labs and discussed the procedure including the risks, benefits and alternatives for the proposed anesthesia with the patient or authorized representative who has indicated his/her understanding and acceptance.     Dental advisory given  Plan Discussed with: CRNA and Anesthesiologist  Anesthesia Plan Comments:        Anesthesia Quick Evaluation

## 2022-01-25 NOTE — Progress Notes (Signed)
Patient resting quietly between assessments of pain.

## 2022-01-25 NOTE — Anesthesia Postprocedure Evaluation (Signed)
Anesthesia Post Note  Patient: Lauren Leonard  Procedure(s) Performed: LAPAROSCOPIC BILATERAL SALPINGECTOMY (Bilateral: Abdomen)     Patient location during evaluation: PACU Anesthesia Type: General Level of consciousness: awake Pain management: pain level controlled Vital Signs Assessment: post-procedure vital signs reviewed and stable Respiratory status: spontaneous breathing Cardiovascular status: stable Postop Assessment: no apparent nausea or vomiting Anesthetic complications: no   No notable events documented.  Last Vitals:  Vitals:   01/25/22 1335 01/25/22 1420  BP:  113/73  Pulse: (!) 55 64  Resp: 18 18  Temp:  (!) 36.4 C  SpO2: 98% 97%    Last Pain:  Vitals:   01/25/22 1420  TempSrc:   PainSc: 7                  Jonet Mathies

## 2022-01-25 NOTE — Progress Notes (Signed)
Patient seen prior to discharge. Abdomen is mildly distended, but soft, no rebound/guarding, lap port sites c/d/I with skin glue atop. Vitals all reviewed and are unremarkable, blood pressures normal range. Stable for discharge home.  [name], DO

## 2022-01-25 NOTE — Anesthesia Procedure Notes (Signed)
Procedure Name: Intubation Date/Time: 01/25/2022 11:28 AM  Performed by: Justice Rocher, CRNAPre-anesthesia Checklist: Patient identified, Emergency Drugs available, Suction available, Patient being monitored and Timeout performed Patient Re-evaluated:Patient Re-evaluated prior to induction Oxygen Delivery Method: Circle system utilized Preoxygenation: Pre-oxygenation with 100% oxygen Induction Type: IV induction Ventilation: Mask ventilation without difficulty Laryngoscope Size: Mac and 3 Grade View: Grade II Tube type: Oral Tube size: 7.0 mm Number of attempts: 1 Airway Equipment and Method: Stylet and Oral airway Placement Confirmation: ETT inserted through vocal cords under direct vision, positive ETCO2, breath sounds checked- equal and bilateral and CO2 detector Secured at: 22 cm Tube secured with: Tape Dental Injury: Teeth and Oropharynx as per pre-operative assessment

## 2022-01-25 NOTE — Discharge Instructions (Signed)

## 2022-01-25 NOTE — Transfer of Care (Signed)
Immediate Anesthesia Transfer of Care Note  Patient: Lauren Leonard  Procedure(s) Performed: Procedure(s) (LRB): LAPAROSCOPIC BILATERAL SALPINGECTOMY (Bilateral)  Patient Location: PACU  Anesthesia Type: General  Level of Consciousness: awake, sedated, patient cooperative and responds to stimulation  Airway & Oxygen Therapy: Patient Spontanous Breathing and Patient connected to [place] oxygen  Post-op Assessment: Report given to PACU RN, Post -op Vital signs reviewed and stable and Patient moving all extremities  Post vital signs: Reviewed and stable  Complications: No apparent anesthesia complications

## 2022-01-26 ENCOUNTER — Encounter (HOSPITAL_BASED_OUTPATIENT_CLINIC_OR_DEPARTMENT_OTHER): Payer: Self-pay | Admitting: Obstetrics and Gynecology

## 2022-01-26 LAB — SURGICAL PATHOLOGY

## 2022-05-19 ENCOUNTER — Other Ambulatory Visit: Payer: Self-pay | Admitting: Family Medicine

## 2022-05-19 DIAGNOSIS — G930 Cerebral cysts: Secondary | ICD-10-CM

## 2022-06-02 ENCOUNTER — Ambulatory Visit
Admission: RE | Admit: 2022-06-02 | Discharge: 2022-06-02 | Disposition: A | Discharge status: Home / Self Care | Payer: BC Managed Care – PPO | Source: Ambulatory Visit | Attending: Family Medicine | Admitting: Family Medicine

## 2022-06-02 DIAGNOSIS — G930 Cerebral cysts: Secondary | ICD-10-CM

## 2022-06-02 MED ORDER — GADOPICLENOL 0.5 MMOL/ML IV SOLN
10.0000 mL | Freq: Once | INTRAVENOUS | Status: AC | PRN
  Administered 2022-06-02: 10 mL via INTRAVENOUS

## 2022-06-25 ENCOUNTER — Emergency Department (HOSPITAL_COMMUNITY)
Admission: EM | Admit: 2022-06-25 | Discharge: 2022-06-25 | Disposition: A | Discharge status: Home / Self Care | Payer: BC Managed Care – PPO | Attending: Emergency Medicine | Admitting: Emergency Medicine

## 2022-06-25 ENCOUNTER — Other Ambulatory Visit: Payer: Self-pay

## 2022-06-25 ENCOUNTER — Encounter (HOSPITAL_COMMUNITY): Payer: Self-pay

## 2022-06-25 DIAGNOSIS — W274XXA Contact with kitchen utensil, initial encounter: Secondary | ICD-10-CM | POA: Insufficient documentation

## 2022-06-25 DIAGNOSIS — Z23 Encounter for immunization: Secondary | ICD-10-CM | POA: Insufficient documentation

## 2022-06-25 DIAGNOSIS — S61112A Laceration without foreign body of left thumb with damage to nail, initial encounter: Secondary | ICD-10-CM | POA: Insufficient documentation

## 2022-06-25 DIAGNOSIS — Y93G3 Activity, cooking and baking: Secondary | ICD-10-CM | POA: Diagnosis not present

## 2022-06-25 DIAGNOSIS — Z91040 Latex allergy status: Secondary | ICD-10-CM | POA: Insufficient documentation

## 2022-06-25 DIAGNOSIS — S60932A Unspecified superficial injury of left thumb, initial encounter: Secondary | ICD-10-CM | POA: Diagnosis present

## 2022-06-25 MED ORDER — LIDOCAINE-EPINEPHRINE-TETRACAINE (LET) TOPICAL GEL
3.0000 mL | Freq: Once | TOPICAL | Status: AC
  Administered 2022-06-25: 3 mL via TOPICAL
  Filled 2022-06-25: qty 3

## 2022-06-25 MED ORDER — ACETAMINOPHEN 325 MG PO TABS
650.0000 mg | ORAL_TABLET | Freq: Once | ORAL | Status: AC
  Administered 2022-06-25: 650 mg via ORAL
  Filled 2022-06-25: qty 2

## 2022-06-25 MED ORDER — TETANUS-DIPHTH-ACELL PERTUSSIS 5-2.5-18.5 LF-MCG/0.5 IM SUSY
0.5000 mL | PREFILLED_SYRINGE | Freq: Once | INTRAMUSCULAR | Status: AC
  Administered 2022-06-25: 0.5 mL via INTRAMUSCULAR
  Filled 2022-06-25: qty 0.5

## 2022-06-25 NOTE — Discharge Instructions (Addendum)
As we discussed because of the nature of your wound he would not benefit from any suture repair at this time, but you should rebandage it daily with the Vaseline impregnated gauze, for at least a week, at that time you can switch to regular bandaging, and will slowly heal from the inside out, please monitor for any signs of infection including worsening redness, pus draining from the affected site, worsening pain, or warmth.  If you are concerned about infection please follow-up for repeat evaluation.  In the meantime I would cleanse the site with soap, water every time they change the bandage.

## 2022-06-25 NOTE — ED Provider Notes (Signed)
[place] EMERGENCY DEPARTMENT AT [facility] Provider Note   CSN: [id number] Arrival date & time: 06/25/22  6222     History  Chief Complaint  Patient presents with   Laceration    Lauren Leonard is a 32 y.o. female with past medical history significant for tetanus not up-to-date presents with concern for laceration of left thumb while slicing with a mandolin just prior to arrival.  Patient reports significant bleeding, blood through the first bandage that was applied.  She reports that it feels like it is throbbing.   Laceration      Home Medications Prior to Admission medications   Medication Sig Start Date End Date Taking? Authorizing Provider  albuterol (VENTOLIN HFA) 108 (90 Base) MCG/ACT inhaler Inhale 2 puffs into the lungs every 6 (six) hours as needed for wheezing or shortness of breath. 01/07/19   Hall-Potvin, [place], PA-C  Ascorbic Acid (VITAMIN C PO) Take 1 tablet by mouth in the morning.    [provider]  b complex vitamins capsule Take 1 capsule by mouth daily.    [provider]  Cholecalciferol (VITAMIN D3 PO) Take 1 tablet by mouth daily.    [provider]  desloratadine (CLARINEX) 5 MG tablet Take 5 mg by mouth daily.    [provider]  diphenhydrAMINE (BENADRYL) 25 MG tablet Take 25 mg by mouth at bedtime as needed for allergies.    [provider]  ibuprofen (ADVIL) 800 MG tablet Take 1 tablet (800 mg total) by mouth every 8 (eight) hours as needed. 01/25/22   Steva Ready, DO  ipratropium (ATROVENT) 0.06 % nasal spray Place 1 spray into both nostrils in the morning and at bedtime. 07/13/21   [provider]  Multiple Vitamins-Minerals (ZINC PO) Take by mouth daily.    [provider]  pantoprazole (PROTONIX) 40 MG tablet Take 40 mg by mouth daily before breakfast.    [provider]  Prenatal Vit-Fe Fumarate-FA (MULTIVITAMIN-PRENATAL) 27-0.8 MG TABS tablet Take 1  tablet by mouth in the morning.    [provider]      Allergies    Zoloft [sertraline], Citric acid, and Latex    Review of Systems   Review of Systems  All other systems reviewed and are negative.   Physical Exam Updated Vital Signs BP 118/75   Pulse 72   Temp 98.1 F (36.7 C) (Oral)   Resp 18   Ht 5\' 6"  (1.676 m)   Wt 108.9 kg   LMP 02/17/2022 Comment: Pt is breastfeeding.  SpO2 99%   Breastfeeding Yes   BMI 38.74 kg/m  Physical Exam Vitals and nursing note reviewed.  Constitutional:      General: She is not in acute distress.    Appearance: Normal appearance.  HENT:     Head: Normocephalic and atraumatic.  Eyes:     General:        Right eye: No discharge.        Left eye: No discharge.  Cardiovascular:     Rate and Rhythm: Normal rate and regular rhythm.  Pulmonary:     Effort: Pulmonary effort is normal. No respiratory distress.  Musculoskeletal:        General: No deformity.  Skin:    General: Skin is warm and dry.     Comments: Patient with clean laceration on the entire tip of her left thumb, she wants perhaps 2 to 3 mm of depth, but total exposed surfaces 1 x  2 mm, small amount of nail was also lost but there is no evidence of laceration going into the deep nailbed.  Bleeding controlled with LET gel.  Neurological:     Mental Status: She is alert and oriented to person, place, and time.  Psychiatric:        Mood and Affect: Mood normal.        Behavior: Behavior normal.     ED Results / Procedures / Treatments   Labs (all labs ordered are listed, but only abnormal results are displayed) Labs Reviewed - No data to display  EKG None  Radiology No results found.  Procedures Procedures    Medications Ordered in ED Medications  lidocaine-EPINEPHrine-tetracaine (LET) topical gel (3 mLs Topical Given 06/25/22 1941)  acetaminophen (TYLENOL) tablet 650 mg (650 mg Oral Given 06/25/22 1946)  Tdap (BOOSTRIX) injection 0.5 mL (0.5 mLs  Intramuscular Given 06/25/22 2102)    ED Course/ Medical Decision Making/ A&P                             Medical Decision Making Risk OTC drugs.   This patient is a 32 y.o. female who presents to the ED for concern of laceration of the tip of her thumb.   Differential diagnoses prior to evaluation: Laceration requiring repair versus partial superficial amputation, versus nailbed damage, partial amputation involving tendon, bone, versus other  Past Medical History / Social History / Additional history: Chart reviewed. Pertinent results include: Overall noncontributory, patient not up-to-date on her tetanus  Physical Exam: Physical exam performed. The pertinent findings include:  Patient with clean laceration on the entire tip of her left thumb, she wants perhaps 2 to 3 mm of depth, but total exposed surfaces 1 x 2 mm, small amount of nail was also lost but there is no evidence of laceration going into the deep nailbed.  Bleeding controlled with LET gel.   She is otherwise neurovascularly intact, with normal flexion, extension, opposition of the affected bone  Medications / Treatment: Let gel applied, and wound monitored until hemostasis achieved.  I closely examined to see whether or not it would benefit from any suture repair, I do not see a way to pull the skin together, we will let it heal by secondary intention with Vaseline gauze, monitoring, time   Disposition: After consideration of the diagnostic results and the patients response to treatment, I feel that patient is stable for discharge, discussed wound care, changing bandage, and return precautions for any signs of developing infection.   emergency department workup does not suggest an emergent condition requiring admission or immediate intervention beyond what has been performed at this time. The plan is: as above. The patient is safe for discharge and has been instructed to return immediately for worsening symptoms, change in  symptoms or any other concerns.  Final Clinical Impression(s) / ED Diagnoses Final diagnoses:  Laceration of left thumb without foreign body with damage to nail, initial encounter    Rx / DC Orders ED Discharge Orders     None         [place] 06/25/22 2121    Linwood Dibbles, MD 06/27/22 [phone]

## 2022-06-25 NOTE — ED Triage Notes (Signed)
Pt to ED by POV from home c/o laceration to her left thumb. Pt sliced her thumb open on a mandolin while cooking this evening. Arrives A+O, VSS, bleeding is controlled at this time.

## 2022-07-11 ENCOUNTER — Ambulatory Visit: Payer: BC Managed Care – PPO | Admitting: Diagnostic Neuroimaging

## 2022-07-11 ENCOUNTER — Encounter: Payer: Self-pay | Admitting: Diagnostic Neuroimaging

## 2022-10-09 ENCOUNTER — Other Ambulatory Visit (HOSPITAL_COMMUNITY): Payer: Self-pay | Admitting: General Surgery

## 2022-10-12 ENCOUNTER — Telehealth: Payer: Self-pay | Admitting: Diagnostic Neuroimaging

## 2022-10-12 NOTE — Telephone Encounter (Signed)
Patient requesting provider switch request from Dr. Marjory Lies to Dr. Lucia Gaskins. Patient referred for headaches, Pseudotumor cerebri. Last seen Dr. Marjory Lies 2018 for migraines. Please advise if this is appropriate, thank you

## 2022-10-12 NOTE — Telephone Encounter (Signed)
That's fine she was last seen in 2018.

## 2022-11-02 ENCOUNTER — Ambulatory Visit (HOSPITAL_COMMUNITY)
Admission: RE | Admit: 2022-11-02 | Discharge: 2022-11-02 | Disposition: A | Discharge status: Home / Self Care | Payer: BC Managed Care – PPO | Source: Ambulatory Visit | Attending: General Surgery | Admitting: General Surgery

## 2022-11-02 ENCOUNTER — Ambulatory Visit: Payer: BC Managed Care – PPO | Admitting: Dietician

## 2022-11-13 ENCOUNTER — Ambulatory Visit: Payer: BC Managed Care – PPO | Admitting: Plastic Surgery

## 2022-11-13 ENCOUNTER — Encounter: Payer: Self-pay | Admitting: Plastic Surgery

## 2022-11-13 VITALS — BP 119/76 | HR 67 | Ht 66.0 in | Wt 260.6 lb

## 2022-11-13 DIAGNOSIS — M6208 Separation of muscle (nontraumatic), other site: Secondary | ICD-10-CM

## 2022-11-13 DIAGNOSIS — E65 Localized adiposity: Secondary | ICD-10-CM | POA: Diagnosis not present
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
## 2022-11-23 ENCOUNTER — Ambulatory Visit: Payer: BC Managed Care – PPO | Admitting: Dietician

## 2022-12-12 ENCOUNTER — Encounter (HOSPITAL_COMMUNITY): Admission: RE | Admit: 2022-12-12 | Payer: BC Managed Care – PPO | Source: Ambulatory Visit

## 2023-02-01 ENCOUNTER — Ambulatory Visit: Payer: BC Managed Care – PPO | Admitting: Neurology

## 2023-02-09 ENCOUNTER — Institutional Professional Consult (permissible substitution): Payer: BC Managed Care – PPO | Admitting: Plastic Surgery

## 2024-01-07 ENCOUNTER — Emergency Department (HOSPITAL_COMMUNITY)
Admission: EM | Admit: 2024-01-07 | Discharge: 2024-01-08 | Payer: Self-pay | Attending: Emergency Medicine | Admitting: Emergency Medicine

## 2024-01-07 ENCOUNTER — Encounter (HOSPITAL_COMMUNITY): Payer: Self-pay

## 2024-01-07 ENCOUNTER — Other Ambulatory Visit: Payer: Self-pay

## 2024-01-07 ENCOUNTER — Emergency Department (HOSPITAL_COMMUNITY): Payer: Self-pay

## 2024-01-07 DIAGNOSIS — Y92410 Unspecified street and highway as the place of occurrence of the external cause: Secondary | ICD-10-CM | POA: Insufficient documentation

## 2024-01-07 DIAGNOSIS — Z5321 Procedure and treatment not carried out due to patient leaving prior to being seen by health care provider: Secondary | ICD-10-CM | POA: Diagnosis not present

## 2024-01-07 DIAGNOSIS — M545 Low back pain, unspecified: Secondary | ICD-10-CM | POA: Diagnosis not present

## 2024-01-07 DIAGNOSIS — R519 Headache, unspecified: Secondary | ICD-10-CM | POA: Diagnosis present

## 2024-01-07 LAB — PREGNANCY, URINE: Preg Test, Ur: NEGATIVE

## 2024-01-07 MED ORDER — IBUPROFEN 400 MG PO TABS
600.0000 mg | ORAL_TABLET | Freq: Once | ORAL | Status: AC
  Administered 2024-01-07: 600 mg via ORAL
  Filled 2024-01-07: qty 1

## 2024-01-07 NOTE — ED Notes (Signed)
 Pt in peds with her child who is also checked in when called for vitals reassessment
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
## 2024-01-08 NOTE — ED Notes (Signed)
 Pt name called to go back to see provider, no response. Pt also no longer in peds.

## 2024-03-15 IMAGING — US US MFM OB TRANSVAGINAL
1 series · 14 of 28 positions shown · non-contrast
Comparison: none

[Series 1: us mfm ob transvaginal · 14 of 58 slices shown]
[im 3/58]
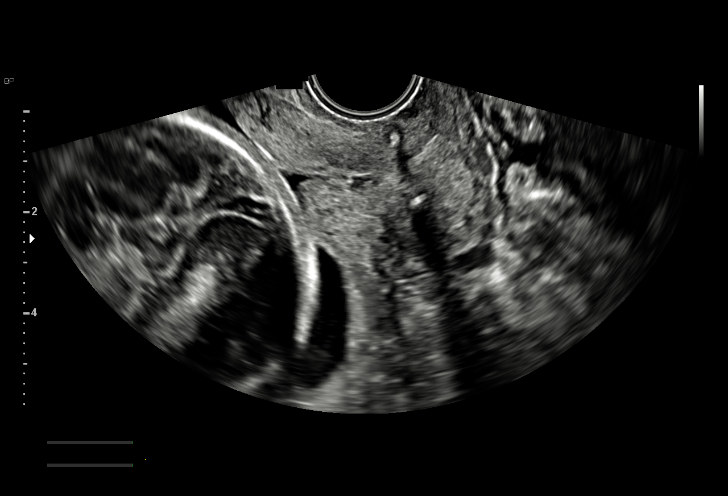
[im 7/58]
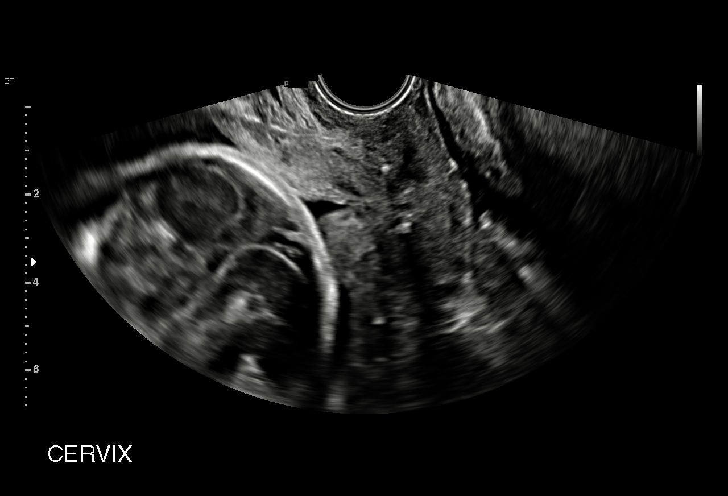
[im 11/58]
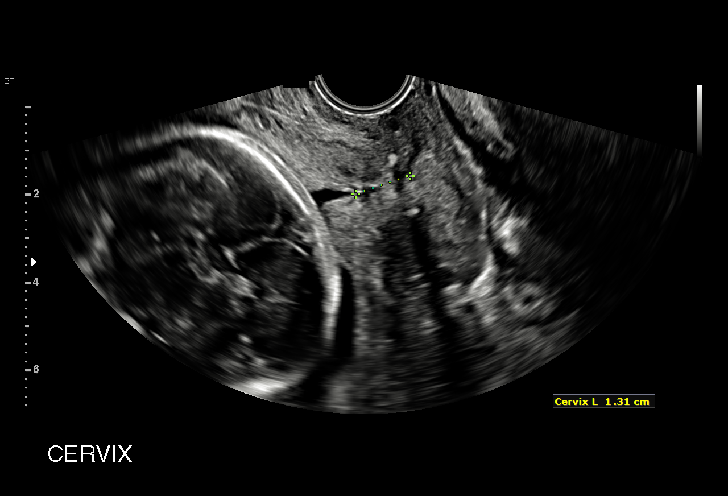
[im 15/58]
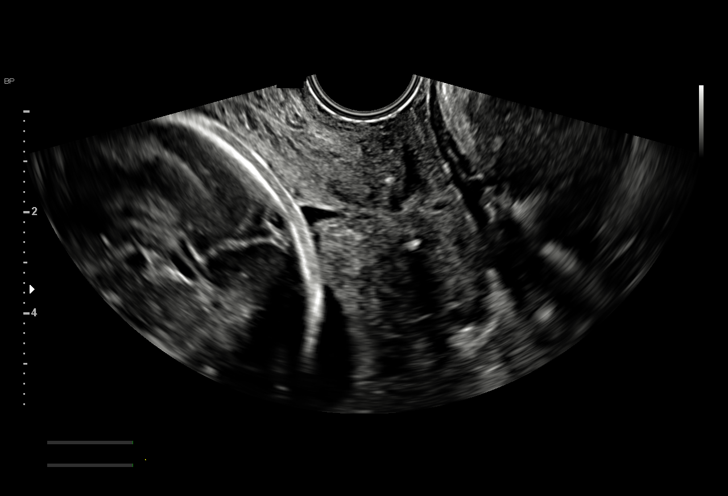
[im 20/58]
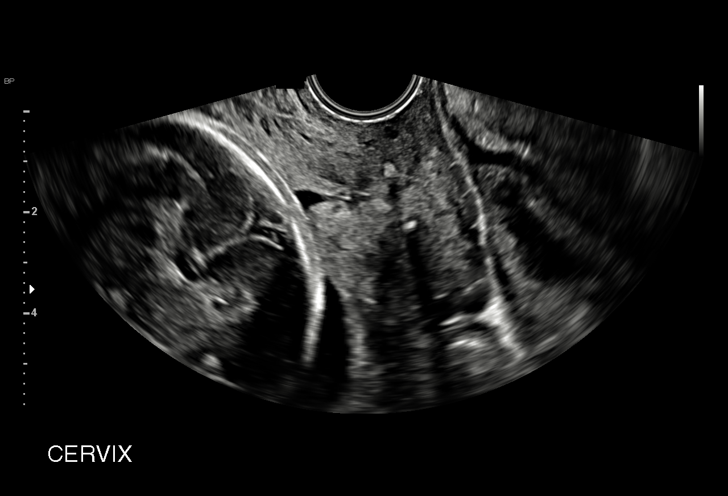
[im 24/58]
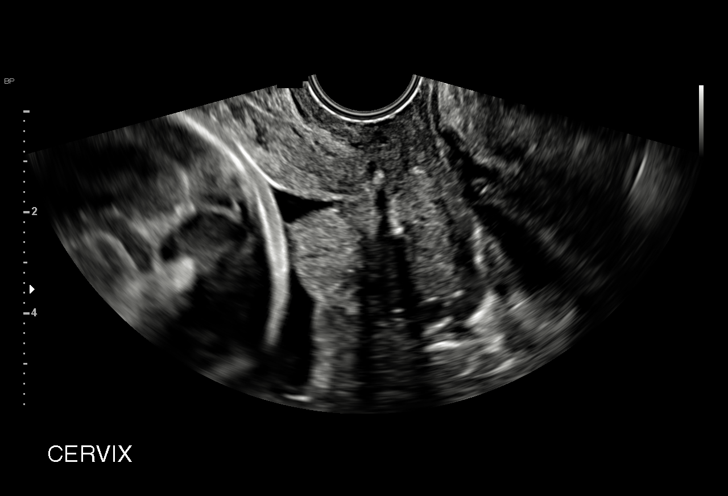
[im 28/58]
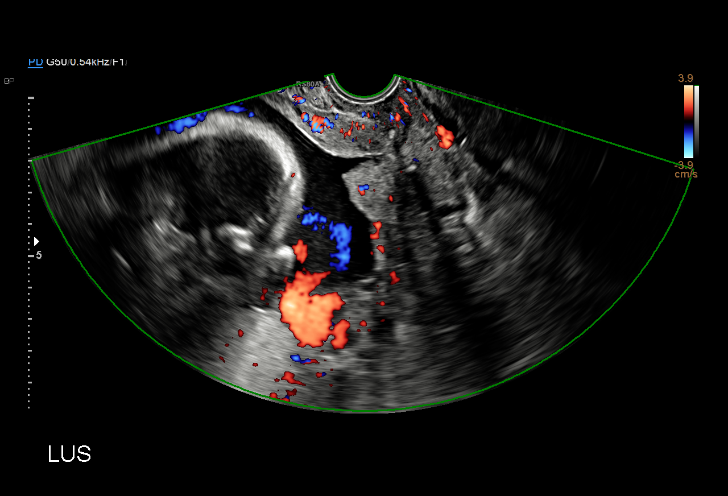
[im 32/58]
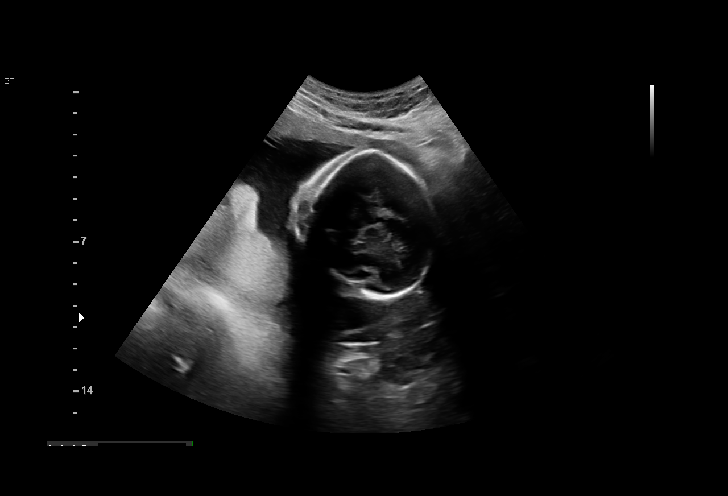
[im 36/58]
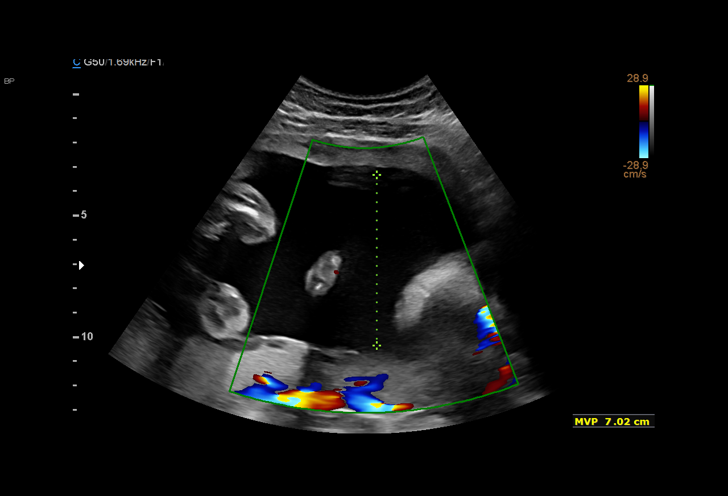
[im 41/58]
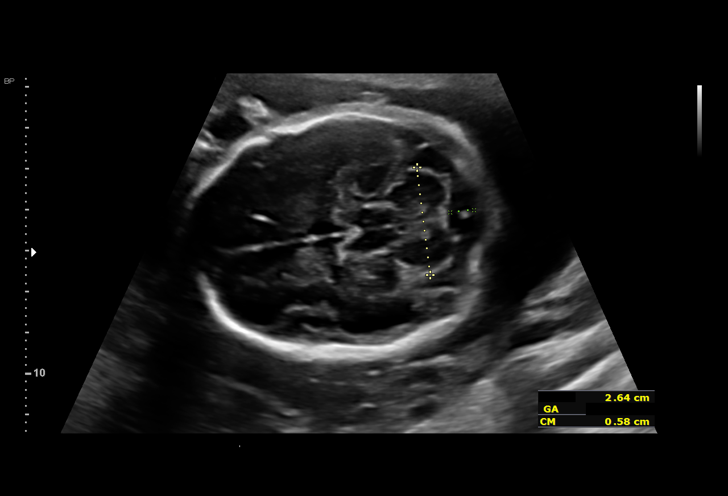
[im 45/58]
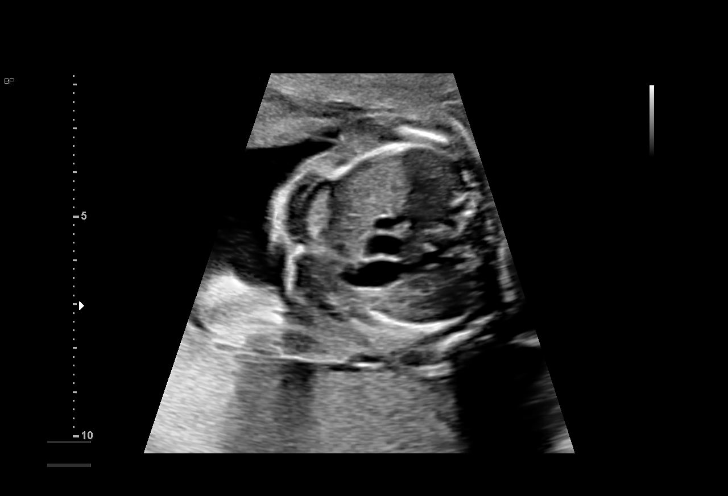
[im 49/58]
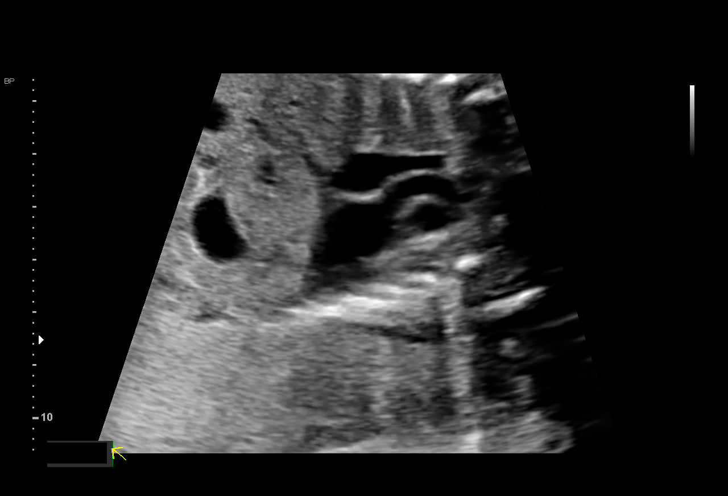
[im 53/58]
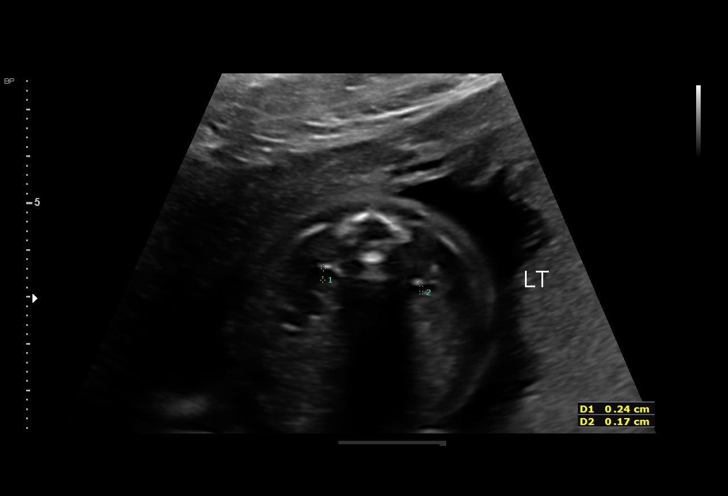
[im 58/58]
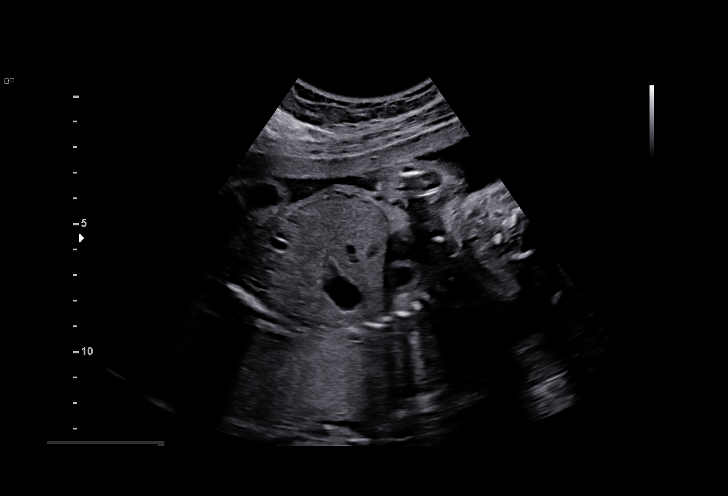

[14 of 28 positions shown; findings below may reference images not displayed]

[REDACTED]
                                                            Ave., [HOSPITAL]

 1  US MFM OB TRANSVAGINAL                76817.2     [facility]
                                                      [facility]
 2  US MFM OB LIMITED                     76815.01    [facility]
                                                      [facility]

Indications

 Cervical cerclage suture present, second
 trimester
 Obesity complicating pregnancy, second
 trimester BMI 42
 Previous cervical surgery- LEEP
 Asthma                                         H55.W5 [id number]
 LR NIPS, Neg Horizon
 24 weeks gestation of pregnancy
Fetal Evaluation

 Num Of Fetuses:         1
 Fetal Heart Rate(bpm):  140
 Cardiac Activity:       Observed
 Presentation:           Cephalic
 Placenta:               Posterior
 P. Cord Insertion:      Previously Visualized

 Amniotic Fluid
 AFI FV:      Within normal limits

                             Largest Pocket(cm)

Biometry

 CER:      26.4  mm     G. Age:  23w 5d         28  %

 LV:        3.3  mm
 CM:        5.8  mm
OB History

 Gravidity:    4         Term:   1         SAB:   1
 TOP:          1        Living:  1
Gestational Age

 LMP:           24w 6d        Date:  02/11/21                 EDD:   11/18/21
 Best:          24w 6d     Det. By:  LMP  (02/11/21)          EDD:   11/18/21
Anatomy

 Cranium:               Appears normal         Aortic Arch:            Previously seen
 Cavum:                 Appears normal         Ductal Arch:            Previously seen
 Ventricles:            Appears normal         Diaphragm:              Appears normal
 Choroid Plexus:        Previously seen        Stomach:                Appears normal, left
                                                                       sided
 Cerebellum:            Appears normal         Abdomen:                Appears normal
 Posterior Fossa:       Appears normal         Abdominal Wall:         Previously seen
 Nuchal Fold:           Previously seen        Cord Vessels:           Previously seen
 Face:                  Orbits and profile     Kidneys:                Appear normal
                        previously seen
 Lips:                  Previously seen        Bladder:                Appears normal
 Thoracic:              Appears normal         Spine:                  Previously seen
 Heart:                 Appears normal         Upper Extremities:      Previously seen
                        (4CH, axis, and
                        situs)
 RVOT:                  Appears normal         Lower Extremities:      Previously seen
 LVOT:                  Appears normal

 Other:  VC, 3VV and 3VTV visualized. Female gender previously seen.
Cervix Uterus Adnexa

 Cervix
 Length:            1.8  cm.
 Cerclage visualized.

 Uterus
 Normal shape and size.

 Right Ovary
 Within normal limits.

 Left Ovary
 Within normal limits.
Impression

 Limited exam given shortened cervix.
 A shirodkar cerclage was placed
 Today the length from the internal os to cerclage was 0.8 cm
 Total CL 1.8 cm.

 She has had some mild cramping and mucus but no
 bleeding.
Recommendations

 Growth scheduled on [DATE]
